# Patient Record
Sex: Male | Born: 1970 | Race: Black or African American | Hispanic: No | Smoking: Former smoker
Health system: Southern US, Community
[De-identification: ages and names within clinical notes are randomized; demographics above are authoritative.]

## PROBLEM LIST (undated history)

## (undated) DIAGNOSIS — F411 Generalized anxiety disorder: Secondary | ICD-10-CM

## (undated) DIAGNOSIS — I10 Essential (primary) hypertension: Secondary | ICD-10-CM

## (undated) HISTORY — PX: FINGER SURGERY: SHX640

## (undated) HISTORY — DX: Generalized anxiety disorder: F41.1

## (undated) HISTORY — PX: HIP SURGERY: SHX245

---

## 2012-08-24 ENCOUNTER — Emergency Department (HOSPITAL_COMMUNITY)
Admission: EM | Admit: 2012-08-24 | Discharge: 2012-08-24 | Disposition: A | Discharge status: Home / Self Care | Payer: Managed Care, Other (non HMO) | Attending: Emergency Medicine | Admitting: Emergency Medicine

## 2012-08-24 ENCOUNTER — Encounter (HOSPITAL_COMMUNITY): Payer: Self-pay | Admitting: Emergency Medicine

## 2012-08-24 DIAGNOSIS — R109 Unspecified abdominal pain: Secondary | ICD-10-CM | POA: Insufficient documentation

## 2012-08-24 DIAGNOSIS — Y929 Unspecified place or not applicable: Secondary | ICD-10-CM | POA: Insufficient documentation

## 2012-08-24 DIAGNOSIS — S39012A Strain of muscle, fascia and tendon of lower back, initial encounter: Secondary | ICD-10-CM

## 2012-08-24 DIAGNOSIS — X500XXA Overexertion from strenuous movement or load, initial encounter: Secondary | ICD-10-CM | POA: Insufficient documentation

## 2012-08-24 DIAGNOSIS — Y939 Activity, unspecified: Secondary | ICD-10-CM | POA: Insufficient documentation

## 2012-08-24 DIAGNOSIS — F17200 Nicotine dependence, unspecified, uncomplicated: Secondary | ICD-10-CM | POA: Insufficient documentation

## 2012-08-24 DIAGNOSIS — S335XXA Sprain of ligaments of lumbar spine, initial encounter: Secondary | ICD-10-CM | POA: Insufficient documentation

## 2012-08-24 DIAGNOSIS — I10 Essential (primary) hypertension: Secondary | ICD-10-CM | POA: Insufficient documentation

## 2012-08-24 DIAGNOSIS — M549 Dorsalgia, unspecified: Secondary | ICD-10-CM | POA: Insufficient documentation

## 2012-08-24 HISTORY — DX: Essential (primary) hypertension: I10

## 2012-08-24 LAB — CBC WITH DIFFERENTIAL/PLATELET
Eosinophils Absolute: 0 10*3/uL (ref 0.0–0.7)
Eosinophils Relative: 0 % (ref 0–5)
HCT: 42.5 % (ref 39.0–52.0)
Hemoglobin: 15.4 g/dL (ref 13.0–17.0)
Lymphs Abs: 1.8 10*3/uL (ref 0.7–4.0)
MCH: 29.9 pg (ref 26.0–34.0)
MCV: 82.5 fL (ref 78.0–100.0)
Monocytes Absolute: 0.9 10*3/uL (ref 0.1–1.0)
Monocytes Relative: 6 % (ref 3–12)
Platelets: 201 10*3/uL (ref 150–400)
RBC: 5.15 MIL/uL (ref 4.22–5.81)

## 2012-08-24 LAB — COMPREHENSIVE METABOLIC PANEL
BUN: 14 mg/dL (ref 6–23)
Calcium: 10.2 mg/dL (ref 8.4–10.5)
GFR calc Af Amer: >90 mL/min (ref >90)
GFR calc non Af Amer: >90 mL/min (ref >90)
Glucose, Bld: 73 mg/dL (ref 70–99)
Total Protein: 8.6 g/dL — ABNORMAL HIGH (ref 6.0–8.3)

## 2012-08-24 LAB — URINE MICROSCOPIC-ADD ON

## 2012-08-24 LAB — URINALYSIS, ROUTINE W REFLEX MICROSCOPIC
Ketones, ur: 15 mg/dL — AB
Leukocytes, UA: NEGATIVE
Nitrite: NEGATIVE
Specific Gravity, Urine: 1.022 (ref 1.005–1.030)
Urobilinogen, UA: 1 mg/dL (ref 0.0–1.0)
pH: 5.5 (ref 5.0–8.0)

## 2012-08-24 MED ORDER — ORPHENADRINE CITRATE ER 100 MG PO TB12: 100.0000 mg | ORAL_TABLET | Freq: Two times a day (BID) | ORAL | Status: DC

## 2012-08-24 MED ORDER — OXYCODONE-ACETAMINOPHEN 5-325 MG PO TABS
1.0000 | ORAL_TABLET | Freq: Once | ORAL | Status: AC
  Administered 2012-08-24: 1 via ORAL
  Filled 2012-08-24: qty 1

## 2012-08-24 MED ORDER — NAPROXEN 500 MG PO TABS: 500.0000 mg | ORAL_TABLET | Freq: Two times a day (BID) | ORAL | Status: DC

## 2012-08-24 MED ORDER — OXYCODONE-ACETAMINOPHEN 5-325 MG PO TABS: 1.0000 | ORAL_TABLET | ORAL | Status: DC | PRN

## 2012-08-24 MED ORDER — IBUPROFEN 800 MG PO TABS
800.0000 mg | ORAL_TABLET | Freq: Once | ORAL | Status: AC
  Administered 2012-08-24: 800 mg via ORAL
  Filled 2012-08-24: qty 1

## 2012-08-24 MED ORDER — CYCLOBENZAPRINE HCL 10 MG PO TABS
10.0000 mg | ORAL_TABLET | Freq: Once | ORAL | Status: AC
  Administered 2012-08-24: 10 mg via ORAL
  Filled 2012-08-24: qty 1

## 2012-08-24 NOTE — ED Notes (Signed)
Patient reports bilateral flank pain for the past several weeks; patient states that he strained his back tonight, which has caused an increase in pain.  Denies history of kidney stones; denies changes in urine.

## 2012-08-24 NOTE — ED Provider Notes (Signed)
History     CSN: [id number]  Arrival date & time 08/24/12  2014   First MD Initiated Contact with Patient 08/24/12 2213      Chief Complaint  Patient presents with  . Back Pain  . Flank Pain    (Consider location/radiation/quality/duration/timing/severity/associated sxs/prior treatment) Patient is a 42 y.o. male presenting with back pain and flank pain. The history is provided by the patient.  Back Pain   Flank Pain  He has been having pain in both flanks intermittently for last month. Today, he twisted and noted sudden, sharp pain in the left flank. Pain would be absent to be held still worse if he bends over or twisted. When present, pain was severe and he rated it at 9/10. There is no radiation of pain. There is no nausea vomiting. He denies any dysuria. He has not taken anything for pain.  Past Medical History  Diagnosis Date  . Hypertension     Past Surgical History  Procedure Date  . Hip surgery     History reviewed. No pertinent family history.  History  Substance Use Topics  . Smoking status: Current Every Day Smoker -- 1.0 packs/day  . Smokeless tobacco: Not on file  . Alcohol Use: Yes      Review of Systems  Genitourinary: Positive for flank pain.  Musculoskeletal: Positive for back pain.  All other systems reviewed and are negative.    Allergies  Review of patient's allergies indicates no known allergies.  Home Medications   Current Outpatient Rx  Name  Route  Sig  Dispense  Refill  . IBUPROFEN 200 MG PO TABS   Oral   Take 200 mg by mouth every 6 (six) hours as needed. For pain         . NAPROXEN 500 MG PO TABS   Oral   Take 1 tablet (500 mg total) by mouth 2 (two) times daily.   30 tablet   0   . ORPHENADRINE CITRATE ER 100 MG PO TB12   Oral   Take 1 tablet (100 mg total) by mouth 2 (two) times daily.   30 tablet   0   . OXYCODONE-ACETAMINOPHEN 5-325 MG PO TABS   Oral   Take 1 tablet by mouth every 4 (four) hours as needed for  pain.   20 tablet   0     BP 213/95  Pulse 87  Temp 99.1 F (37.3 C) (Oral)  Resp 18  SpO2 100%  Physical Exam  Nursing note and vitals reviewed.  42 year old male, resting comfortably and in no acute distress. Vital signs are significant for hypertension with blood pressure 213/95. Oxygen saturation is 100%, which is normal. Head is normocephalic and atraumatic. PERRLA, EOMI. Oropharynx is clear. Neck is nontender and supple without adenopathy or JVD. Back has moderate tenderness and spasm in the left paralumbar area. There is a positive straight leg raise bilaterally at 30.. Lungs are clear without rales, wheezes, or rhonchi. Chest is nontender. Heart has regular rate and rhythm without murmur. Abdomen is soft, flat, nontender without masses or hepatosplenomegaly and peristalsis is normoactive. Extremities have no cyanosis or edema, full range of motion is present. Skin is warm and dry without rash. Neurologic: Mental status is normal, cranial nerves are intact, there are no motor or sensory deficits.\  ED Course  Procedures (including critical care time)  Results for orders placed during the hospital encounter of 08/24/12  CBC WITH DIFFERENTIAL  Component Value Range   WBC 15.5 (*) 4.0 - 10.5 K/uL   RBC 5.15  4.22 - 5.81 MIL/uL   Hemoglobin 15.4  13.0 - 17.0 g/dL   HCT 16.1  09.6 - 04.5 %   MCV 82.5  78.0 - 100.0 fL   MCH 29.9  26.0 - 34.0 pg   MCHC 36.2 (*) 30.0 - 36.0 g/dL   RDW 40.9  81.1 - 91.4 %   Platelets 201  150 - 400 K/uL   Neutrophils Relative 82 (*) 43 - 77 %   Neutro Abs 12.7 (*) 1.7 - 7.7 K/uL   Lymphocytes Relative 12  12 - 46 %   Lymphs Abs 1.8  0.7 - 4.0 K/uL   Monocytes Relative 6  3 - 12 %   Monocytes Absolute 0.9  0.1 - 1.0 K/uL   Eosinophils Relative 0  0 - 5 %   Eosinophils Absolute 0.0  0.0 - 0.7 K/uL   Basophils Relative 1  0 - 1 %   Basophils Absolute 0.1  0.0 - 0.1 K/uL  COMPREHENSIVE METABOLIC PANEL      Component Value Range    Sodium 138  135 - 145 mEq/L   Potassium 4.0  3.5 - 5.1 mEq/L   Chloride 98  96 - 112 mEq/L   CO2 20  19 - 32 mEq/L   Glucose, Bld 73  70 - 99 mg/dL   BUN 14  6 - 23 mg/dL   Creatinine, Ser 7.82  0.50 - 1.35 mg/dL   Calcium 95.6  8.4 - 21.3 mg/dL   Total Protein 8.6 (*) 6.0 - 8.3 g/dL   Albumin 5.0  3.5 - 5.2 g/dL   AST 086 (*) 0 - 37 U/L   ALT 68 (*) 0 - 53 U/L   Alkaline Phosphatase 78  39 - 117 U/L   Total Bilirubin 0.5  0.3 - 1.2 mg/dL   GFR calc non Af Amer >90  >90 mL/min   GFR calc Af Amer >90  >90 mL/min  URINALYSIS, ROUTINE W REFLEX MICROSCOPIC      Component Value Range   Color, Urine YELLOW  YELLOW   APPearance HAZY (*) CLEAR   Specific Gravity, Urine 1.022  1.005 - 1.030   pH 5.5  5.0 - 8.0   Glucose, UA NEGATIVE  NEGATIVE mg/dL   Hgb urine dipstick NEGATIVE  NEGATIVE   Bilirubin Urine NEGATIVE  NEGATIVE   Ketones, ur 15 (*) NEGATIVE mg/dL   Protein, ur 30 (*) NEGATIVE mg/dL   Urobilinogen, UA 1.0  0.0 - 1.0 mg/dL   Nitrite NEGATIVE  NEGATIVE   Leukocytes, UA NEGATIVE  NEGATIVE  URINE MICROSCOPIC-ADD ON      Component Value Range   Squamous Epithelial / LPF FEW (*) RARE   WBC, UA 0-2  <3 WBC/hpf    1. Lumbar strain       MDM  Acute lumbar strain. Severe hypertension is noted. Blood pressure will be repeated. You'll need to followup with his PCP for further evaluation of his blood pressure. Blood pressure will be repeated prior to discharge.        Dione Booze, MD 08/24/12 2226

## 2012-10-18 ENCOUNTER — Telehealth (INDEPENDENT_AMBULATORY_CARE_PROVIDER_SITE_OTHER): Payer: Self-pay

## 2012-10-18 NOTE — Telephone Encounter (Signed)
[facility] notifying pt that Dr Michaell Cowing is canceling the office for 3/24 and I r/s the appt 4/9 arrive at 9:45 for an 10:15.

## 2012-10-24 ENCOUNTER — Ambulatory Visit (INDEPENDENT_AMBULATORY_CARE_PROVIDER_SITE_OTHER): Payer: Self-pay | Admitting: Surgery

## 2012-11-09 ENCOUNTER — Ambulatory Visit (INDEPENDENT_AMBULATORY_CARE_PROVIDER_SITE_OTHER): Payer: Managed Care, Other (non HMO) | Admitting: Surgery

## 2012-11-09 ENCOUNTER — Encounter (INDEPENDENT_AMBULATORY_CARE_PROVIDER_SITE_OTHER): Payer: Self-pay | Admitting: Surgery

## 2012-11-09 VITALS — BP 136/84 | HR 96 | Temp 97.8°F | Resp 16 | Ht 76.75 in | Wt 151.0 lb

## 2012-11-09 DIAGNOSIS — K648 Other hemorrhoids: Secondary | ICD-10-CM | POA: Insufficient documentation

## 2012-11-09 DIAGNOSIS — F411 Generalized anxiety disorder: Secondary | ICD-10-CM

## 2012-11-09 HISTORY — DX: Generalized anxiety disorder: F41.1

## 2012-11-09 NOTE — Patient Instructions (Addendum)
ANORECTAL SURGERY: POST OP INSTRUCTIONS  1. Take your usually prescribed home medications unless otherwise directed. 2. DIET: Follow a light bland diet the first 24 hours after arrival home, such as soup, liquids, crackers, etc.  Be sure to include lots of fluids daily.  Avoid fast food or heavy meals as your are more likely to get nauseated.  Eat a low fat the next few days after surgery.   3. PAIN CONTROL: a. Pain is best controlled by a usual combination of three different methods TOGETHER: i. Ice/Heat ii. Over the counter pain medication iii. Prescription pain medication b. Most patients will experience some swelling and discomfort in the anus/rectal area. and incisions.  Ice packs or heat (30-60 minutes up to 6 times a day) will help. Use ice for the first few days to help decrease swelling and bruising, then switch to heat such as warm towels, sitz baths, warm baths, etc to help relax tight/sore spots and speed recovery.  Some people prefer to use ice alone, heat alone, alternating between ice & heat.  Experiment to what works for you.  Swelling and bruising can take several weeks to resolve.   c. It is helpful to take an over-the-counter pain medication regularly for the first few weeks.  Choose one of the following that works best for you: i. Naproxen (Aleve, etc)  Two 220mg tabs twice a day ii. Ibuprofen (Advil, etc) Three 200mg tabs four times a day (every meal & bedtime) iii. Acetaminophen (Tylenol, etc) 500-650mg four times a day (every meal & bedtime) d. A  prescription for pain medication (such as oxycodone, hydrocodone, etc) should be given to you upon discharge.  Take your pain medication as prescribed.  i. If you are having problems/concerns with the prescription medicine (does not control pain, nausea, vomiting, rash, itching, etc), please call us [phone] to see if we need to switch you to a different pain medicine that will work better for you and/or control your side effect  better. ii. If you need a refill on your pain medication, please contact your pharmacy.  They will contact our office to request authorization. Prescriptions will not be filled after 5 pm or on week-ends. 4. KEEP YOUR BOWELS REGULAR a. The goal is one bowel movement a day b. Avoid getting constipated.  Between the surgery and the pain medications, it is common to experience some constipation.  Increasing fluid intake and taking a fiber supplement (such as Metamucil, Citrucel, FiberCon, MiraLax, etc) 1-2 times a day regularly will usually help prevent this problem from occurring.  A mild laxative (prune juice, Milk of Magnesia, MiraLax, etc) should be taken according to package directions if there are no bowel movements after 48 hours. c. Watch out for diarrhea.  If you have many loose bowel movements, simplify your diet to bland foods & liquids for a few days.  Stop any stool softeners and decrease your fiber supplement.  Switching to mild anti-diarrheal medications (Kayopectate, Pepto Bismol) can help.  If this worsens or does not improve, please call us.  5. Wound Care a. Remove your bandages the day after surgery.  Unless discharge instructions indicate otherwise, leave your bandage dry and in place overnight.  Remove the bandage during your first bowel movement.   b. Allow the wound packing to fall out over the next few days.  You can trim exposed gauze / ribbon as it falls out.  You do not need to repack the wound unless instructed otherwise.  Wear an   absorbent pad or soft cotton gauze in your underwear as needed to catch any drainage and help keep the area  c. Keep the area clean and dry.  Bathe / shower every day.  Keep the area clean by showering / bathing over the incision / wound.   It is okay to soak an open wound to help wash it.  Wet wipes or showers / gentle washing after bowel movements is often less traumatic than regular toilet paper. d. Dennis Bast may have some styrofoam-like soft packing in  the rectum which will come out with the first bowel movement.  e. You will often notice bleeding with bowel movements.  This should slow down by the end of the first week of surgery f. Expect some drainage.  This should slow down, too, by the end of the first week of surgery.  Wear an absorbent pad or soft cotton gauze in your underwear until the drainage stops. 6. ACTIVITIES as tolerated:   a. You may resume regular (light) daily activities beginning the next day-such as daily self-care, walking, climbing stairs-gradually increasing activities as tolerated.  If you can walk 30 minutes without difficulty, it is safe to try more intense activity such as jogging, treadmill, bicycling, low-impact aerobics, swimming, etc. b. Save the most intensive and strenuous activity for last such as sit-ups, heavy lifting, contact sports, etc  Refrain from any heavy lifting or straining until you are off narcotics for pain control.   c. DO NOT PUSH THROUGH PAIN.  Let pain be your guide: If it hurts to do something, don't do it.  Pain is your body warning you to avoid that activity for another week until the pain goes down. d. You may drive when you are no longer taking prescription pain medication, you can comfortably sit for long periods of time, and you can safely maneuver your car and apply brakes. e. Dennis Bast may have sexual intercourse when it is comfortable.  7. FOLLOW UP in our office a. Please call [facility] at [phone] to set up an appointment to see your surgeon in the office for a follow-up appointment approximately 2 weeks after your surgery. b. Make sure that you call for this appointment the day you arrive home to insure a convenient appointment time. 10. IF YOU HAVE DISABILITY OR FAMILY LEAVE FORMS, BRING THEM TO THE OFFICE FOR PROCESSING.  DO NOT GIVE THEM TO YOUR DOCTOR.        WHEN TO CALL us [phone]: 1. Poor pain control 2. Reactions / problems with new medications (rash/itching, nausea,  etc)  3. Fever over 101.5 F (38.5 C) 4. Inability to urinate 5. Nausea and/or vomiting 6. Worsening swelling or bruising 7. Continued bleeding from incision. 8. Increased pain, redness, or drainage from the incision  The clinic staff is available to answer your questions during regular business hours (8:30am-5pm).  Please don't hesitate to call and ask to speak to one of our nurses for clinical concerns.   A surgeon from [facility] is always on call at the hospitals   If you have a medical emergency, go to the nearest emergency room or call 911.    [facility], [place], [place], [place], [place]  [address] ? MAIN: [phone] ? TOLL FREE: [phone] ? FAX [phone] [email or web address]  HEMORRHOIDS  The rectum is the last foot of your colon, and it naturally stretches to hold stool.  Hemorrhoidal piles are natural clusters of blood vessels that  help the rectum and anal canal stretch to hold stool and allow bowel movements to eliminate feces.   Hemorrhoids are abnormally swollen blood vessels in the rectum.  Too much pressure in the rectum causes hemorrhoids by forcing blood to stretch and bulge the walls of the veins, sometimes even rupturing them.  Hemorrhoids can become like varicose veins you might see on a person's legs.  Most people will develop a flare of hemorrhoids in their lifetime.  When bulging hemorrhoidal veins are irritated, they can swell, burn, itch, cause pain, and bleed.  Most flares will calm down gradually own within a few weeks.  However, once hemorrhoids are created, they are difficult to get rid of completely and tend to flare more easily than the first flare.   Fortunately, good habits and simple medical treatment usually control hemorrhoids well, and surgery is needed only in severe cases. Types of Hemorrhoids:  Internal hemorrhoids usually don't initially hurt or itch; they are deep inside the rectum  and usually have no sensation. If they begin to push out (prolapse), pain and burning can occur.  However, internal hemorrhoids can bleed.  Anal bleeding should not be ignored since bleeding could come from a dangerous source like colorectal cancer, so persistent rectal bleeding should be investigated by a doctor, sometimes with a colonoscopy.  External hemorrhoids cause most of the symptoms - pain, burning, and itching. Nonirritated hemorrhoids can look like small skin tags coming out of the anus.   Thrombosed hemorrhoids can form when a hemorrhoid blood vessel bursts and causes the hemorrhoid to suddenly swell.  A purple blood clot can form in it and become an excruciatingly painful lump at the anus. Because of these unpleasant symptoms, immediate incision and drainage by a surgeon at an office visit can provide much relief of the pain.    PREVENTION Avoiding the most frequent causes listed below will prevent most cases of hemorrhoids: Constipation Hard stools Diarrhea  Constant sitting  Straining with bowel movements Sitting on the toilet for a long time  Severe coughing  episodes Pregnancy / Childbirth  Heavy Lifting  Sometimes avoiding the above triggers is difficult:  How can you avoid sitting all day if you have a seated job? Also, we try to avoid coughing and diarrhea, but sometimes it's beyond your control.  Still, there are some practical hints to help: Keep the anal and genital area clean.  Moistened tissues such as flushable wet wipes are less irritating than toilet paper.  Using irrigating showers or bottle irrigation washing gently cleans this sensitive area.   Avoid dry toilet paper when cleaning after bowel movements.  Marland Kitchen Keep the anal and genital area dry.  Lightly pat the rectal area dry.  Avoid rubbing.  Talcum or baby powders can help GET YOUR STOOLS SOFT.   This is the most important way to prevent irritated hemorrhoids.  Hard stools are like sandpaper to the anorectal canal and  will cause more problems.  The goal: ONE SOFT BOWEL MOVEMENT A DAY!  BMs from every other day to 3 times a day is a tolerable range Treat coughing, diarrhea and constipation early since irritated hemorrhoids may soon follow.  If your main job activity is seated, always stand or walk during your breaks. Make it a point to stand and walk at least 5 minutes every hour and try to shift frequently in your chair to avoid direct rectal pressure.  Always exhale as you strain or lift. Don't hold your breath.  Do  not delay or try to prevent a bowel movement when the urge is present. Exercise regularly (walking or jogging 60 minutes a day) to stimulate the bowels to move. No reading or other activity while on the toilet. If bowel movements take longer than 5 minutes, you are too constipated. AVOID CONSTIPATION Drink plenty of liquids (1 1/2 to 2 quarts of water and other fluids a day unless fluid restricted for another medical condition). Liquids that contain caffeine (coffee a, tea, soft drinks) can be dehydrating and should be avoided until constipation is controlled. Consider minimizing milk, as dairy products may be constipating. Eat plenty of fiber (30g a day ideal, more if needed).  Fiber is the undigested part of plant food that passes into the colon, acting as "natures broom" to encourage bowel motility and movement.  Fiber can absorb and hold large amounts of water. This results in a larger, bulkier stool, which is soft and easier to pass.  Eating foods high in fiber - 12 servings - such as  Vegetables: Root (potatoes, carrots, turnips), Leafy green (lettuce, salad greens, celery, spinach), High residue (cabbage, broccoli, etc.) Fruit: Fresh, Dried (prunes, apricots, cherries), Stewed (applesauce)  Whole grain breads, pasta, whole wheat Bran cereals, muffins, etc. Consider adding supplemental bulking fiber which retains large volumes of water: Psyllium ground seeds --available as Metamucil, Konsyl,  Effersyllium, Per Diem Fiber, or the less expensive generic forms.  Citrucel  (methylcellulose wood fiber) . FiberCon (Polycarbophil) Polyethylene Glycol - and "artificial" fiber commonly called Miralax or Glycolax.  It is helpful for people with gassy or bloated feelings with regular fiber Flax Seed - a less gassy natural fiber  Laxatives can be useful for a short period if constipation is severe Osmotics (Milk of Magnesia, Fleets Phospho-Soda, Magnesium Citrate)  Stimulants (Senokot,   Castor Oil,  Dulcolax, Ex-Lax)    Laxatives are not a good long-term solution as it can stress the bowels and cause too much mineral loss and dehydration.   Avoid taking laxatives for more than 7 days in a row.  AVOID DIARRHEA Switch to liquids and simpler foods for a few days to avoid stressing your intestines further. Avoid dairy products (especially milk & ice cream) for a short time.  The intestines often can lose the ability to digest lactose when stressed. Avoid foods that cause gassiness or bloating.  Typical foods include beans and other legumes, cabbage, broccoli, and dairy foods.  Every person has some sensitivity to other foods, so listen to your body and avoid those foods that trigger problems for you. Adding fiber (Citrucel, Metamucil, FiberCon, Flax seed, Miralax) gradually can help thicken stools by absorbing excess fluid and retrain the intestines to act more normally.  Slowly increase the dose over a few weeks.  Too much fiber too soon can backfire and cause cramping & bloating. Probiotics (such as active yogurt, Align, etc) may help repopulate the intestines and colon with normal bacteria and calm down a sensitive digestive tract.  Most studies show it to be of mild help, though, and such products can be costly. Medicines: Bismuth subsalicylate (ex. Kayopectate, Pepto Bismol) every 30 minutes for up to 6 doses can help control diarrhea.  Avoid if pregnant. Loperamide (Immodium) can slow down  diarrhea.  Start with two tablets (4mg total) first and then try one tablet every 6 hours.  Avoid if you are having fevers or severe pain.  If you are not better or start feeling worse, stop all medicines and call your doctor for   advice Call your doctor if you are getting worse or not better.  Sometimes further testing (cultures, endoscopy, X-ray studies, bloodwork, etc) may be needed to help diagnose and treat the cause of the diarrhea. TREATMENT OF HEMORRHOID FLARE If these preventive measures fail, you must take action right away! Hemorrhoids are one condition that can be mild in the morning and become intolerable by nightfall. Most hemorrhoidal flares take several weeks to calm down.  These suggestions can help: Warm soaks.  This helps more than any topical medication.  Use up to 8 times a day.  Usually sitz baths or sitting in a warm bathtub helps.  Sitting on moist warm towels are helpful.  Switching to ice packs/cool compresses can be helpful Normalize your bowels.  Extremes of diarrhea or constipation will make hemorrhoids worse.  One soft bowel movement a day is the goal.  Fiber can help get your bowels regular Wet wipes instead of toilet paper Pain control with a NSAID such as ibuprofen (Advil) or naproxen (Aleve) or acetaminophen (Tylenol) around the clock.  Narcotics are constipating and should be minimized if possible Topical creams contain steroids (bydrocortisone) or local anesthetic (xylocaine) can help make pain and itching more tolerable.   EVALUATION If hemorrhoids are still causing problems, you could benefit by an evaluation by a surgeon.  The surgeon will obtain a history and examine you.  If hemorrhoids are diagnosed, some therapies can be offered in the office, usually with an anoscope into the less sensitive area of the rectum: -injection of hemorrhoids (sclerotherapy) can scar the blood vessels of the swollen/enlarged hemorrhoids to help shrink them down to a more normal  size -rubber banding of the enlarged hemorrhoids to help shrink them down to a more normal size -drainage of the blood clot causing a thrombosed hemorrhoid,  to relieve the severe pain   While 90% of the time such problems from hemorrhoids can be managed without preceding to surgery, sometimes the hemorrhoids require a operation to control the problem (uncontrolled bleeding, prolapse, pain, etc.).   This involves being placed under general anesthesia where the surgeon can confirm the diagnosis and remove, suture, or staple the hemorrhoid(s).  Your surgeon can help you treat the problem appropriately.    Smoking Cessation, Tips for Success YOU CAN QUIT SMOKING If you are ready to quit smoking, congratulations! You have chosen to help yourself be healthier. Cigarettes bring nicotine, tar, carbon monoxide, and other irritants into your body. Your lungs, heart, and blood vessels will be able to work better without these poisons. There are many different ways to quit smoking. Nicotine gum, nicotine patches, a nicotine inhaler, or nicotine nasal spray can help with physical craving. Hypnosis, support groups, and medicines help break the habit of smoking. Here are some tips to help you quit for good.  Throw away all cigarettes.  Clean and remove all ashtrays from your home, work, and car.  On a card, write down your reasons for quitting. Carry the card with you and read it when you get the urge to smoke.  Cleanse your body of nicotine. Drink enough water and fluids to keep your urine clear or pale yellow. Do this after quitting to flush the nicotine from your body.  Learn to predict your moods. Do not let a bad situation be your excuse to have a cigarette. Some situations in your life might tempt you into wanting a cigarette.  Never have "just one" cigarette. It leads to wanting another and another. Remind yourself  of your decision to quit.  Change habits associated with smoking. If you smoked while  driving or when feeling stressed, try other activities to replace smoking. Stand up when drinking your coffee. Brush your teeth after eating. Sit in a different chair when you read the paper. Avoid alcohol while trying to quit, and try to drink fewer caffeinated beverages. Alcohol and caffeine may urge you to smoke.  Avoid foods and drinks that can trigger a desire to smoke, such as sugary or spicy foods and alcohol.  Ask people who smoke not to smoke around you.  Have something planned to do right after eating or having a cup of coffee. Take a walk or exercise to perk you up. This will help to keep you from overeating.  Try a relaxation exercise to calm you down and decrease your stress. Remember, you may be tense and nervous for the first 2 weeks after you quit, but this will pass.  Find new activities to keep your hands busy. Play with a pen, coin, or rubber band. Doodle or draw things on paper.  Brush your teeth right after eating. This will help cut down on the craving for the taste of tobacco after meals. You can try mouthwash, too.  Use oral substitutes, such as lemon drops, carrots, a cinnamon stick, or chewing gum, in place of cigarettes. Keep them handy so they are available when you have the urge to smoke.  When you have the urge to smoke, try deep breathing.  Designate your home as a nonsmoking area.  If you are a heavy smoker, ask your caregiver about a prescription for nicotine chewing gum. It can ease your withdrawal from nicotine.  Reward yourself. Set aside the cigarette money you save and buy yourself something nice.  Look for support from others. Join a support group or smoking cessation program. Ask someone at home or at work to help you with your plan to quit smoking.  Always ask yourself, "Do I need this cigarette or is this just a reflex?" Tell yourself, "Today, I choose not to smoke," or "I do not want to smoke." You are reminding yourself of your decision to quit,  even if you do smoke a cigarette. HOW WILL I FEEL WHEN I QUIT SMOKING?  The benefits of not smoking start within days of quitting.  You may have symptoms of withdrawal because your body is used to nicotine (the addictive substance in cigarettes). You may crave cigarettes, be irritable, feel very hungry, cough often, get headaches, or have difficulty concentrating.  The withdrawal symptoms are only temporary. They are strongest when you first quit but will go away within 10 to 14 days.  When withdrawal symptoms occur, stay in control. Think about your reasons for quitting. Remind yourself that these are signs that your body is healing and getting used to being without cigarettes.  Remember that withdrawal symptoms are easier to treat than the major diseases that smoking can cause.  Even after the withdrawal is over, expect periodic urges to smoke. However, these cravings are generally short-lived and will go away whether you smoke or not. Do not smoke!  If you relapse and smoke again, do not lose hope. Most smokers quit 3 times before they are successful.  If you relapse, do not give up! Plan ahead and think about what you will do the next time you get the urge to smoke. LIFE AS A NONSMOKER: MAKE IT FOR A MONTH, MAKE IT FOR LIFE Day 1: Hang  this page where you will see it every day. Day 2: Get rid of all ashtrays, matches, and lighters. Day 3: Drink water. Breathe deeply between sips. Day 4: Avoid places with smoke-filled air, such as bars, clubs, or the smoking section of restaurants. Day 5: Keep track of how much money you save by not smoking. Day 6: Avoid boredom. Keep a good book with you or go to the movies. Day 7: Reward yourself! One week without smoking! Day 8: Make a dental appointment to get your teeth cleaned. Day 9: Decide how you will turn down a cigarette before it is offered to you. Day 10: Review your reasons for quitting. Day 11: Distract yourself. Stay active to keep  your mind off smoking and to relieve tension. Take a walk, exercise, read a book, do a crossword puzzle, or try a new hobby. Day 12: Exercise. Get off the bus before your stop or use stairs instead of escalators. Day 13: Call on friends for support and encouragement. Day 14: Reward yourself! Two weeks without smoking! Day 15: Practice deep breathing exercises. Day 16: Bet a friend that you can stay a nonsmoker. Day 17: Ask to sit in nonsmoking sections of restaurants. Day 18: Hang up "No Smoking" signs. Day 19: Think of yourself as a nonsmoker. Day 20: Each morning, tell yourself you will not smoke. Day 21: Reward yourself! Three weeks without smoking! Day 22: Think of smoking in negative ways. Remember how it stains your teeth, gives you bad breath, and leaves you short of breath. Day 23: Eat a nutritious breakfast. Day 24:Do not relive your days as a smoker. Day 25: Hold a pencil in your hand when talking on the telephone. Day 26: Tell all your friends you do not smoke. Day 27: Think about how much better food tastes. Day 28: Remember, one cigarette is one too many. Day 29: Take up a hobby that will keep your hands busy. Day 30: Congratulations! One month without smoking! Give yourself a big reward. Your caregiver can direct you to community resources or hospitals for support, which may include:  Group support.  Education.  Hypnosis.  Subliminal therapy. Document Released: 04/17/2004 Document Revised: 10/12/2011 Document Reviewed: 05/06/2009 [facility] Patient Information 2013 [place], [place].

## 2012-11-09 NOTE — Progress Notes (Signed)
Subjective:     Patient ID: Christopher Henry, male   DOB: 1971/07/13, 42 y.o.   MRN: [id number]  HPI  Christopher Henry  [birth year] [id number]  Patient Care Team: Sissy Hoff, MD as PCP - General (Family Medicine)  This patient is a 42 y.o.male who presents today for surgical evaluation at the request of Dr. Azucena Cecil.   Reason for visit: Hemorrhoids with prolapse  Young smoking male.  Struggled with him or problems since he was a teenager.  Notes that now they come out every time he moves his bowel.  Has to push it back in.  He usually has one loose bowel movement a day.  Some occasional bleeding.  No severe pain.  No history of prior treatments.  No pus or drainage.  No personal nor family history of GI/colon cancer, inflammatory bowel disease, irritable bowel syndrome, allergy such as Celiac Sprue, dietary/dairy problems, colitis, ulcers nor gastritis.  No recent sick contacts/gastroenteritis.  No travel outside the country.  No changes in diet.  Patient walks 2 miles without difficulty.  No exertional chest/neck/shoulder/arm pain.   Patient Active Problem List  Diagnosis  . Anxiety state, unspecified  . Internal hemorrhoids with prolapse    Past Medical History  Diagnosis Date  . Hypertension     Past Surgical History  Procedure Laterality Date  . Hip surgery      History   Social History  . Marital Status: Single    Spouse Name: N/A    Number of Children: N/A  . Years of Education: N/A   Occupational History  . Not on file.   Social History Main Topics  . Smoking status: Current Every Day Smoker -- 1.00 packs/day  . Smokeless tobacco: Never Used  . Alcohol Use: Yes  . Drug Use: No  . Sexually Active: Not on file   Other Topics Concern  . Not on file   Social History Narrative  . No narrative on file    Family History  Problem Relation Age of Onset  . Hypertension Father   . Hypertension Brother   . Cancer Maternal Grandmother     unknown to pt    Current  Outpatient Prescriptions  Medication Sig Dispense Refill  . losartan (COZAAR) 100 MG tablet       . ibuprofen (ADVIL,MOTRIN) 200 MG tablet Take 200 mg by mouth every 6 (six) hours as needed. For pain      . oxyCODONE-acetaminophen (PERCOCET/ROXICET) 5-325 MG per tablet Take 1 tablet by mouth every 4 (four) hours as needed for pain.  20 tablet  0   No current facility-administered medications for this visit.     No Known Allergies  BP 136/84  Pulse 96  Temp(Src) 97.8 F (36.6 C) (Temporal)  Resp 16  Ht 6' 4.75" (1.949 m)  Wt 151 lb (68.493 kg)  BMI 18.03 kg/m2  No results found.   Review of Systems  Constitutional: Negative for fever, chills and diaphoresis.  HENT: Negative for nosebleeds, sore throat, facial swelling, mouth sores, trouble swallowing and ear discharge.   Eyes: Negative for photophobia, discharge and visual disturbance.  Respiratory: Negative for choking, chest tightness, shortness of breath and stridor.   Cardiovascular: Negative for chest pain and palpitations.  Gastrointestinal: Positive for anal bleeding. Negative for nausea, vomiting, abdominal pain, diarrhea, constipation, blood in stool, abdominal distention and rectal pain.  Genitourinary: Negative for dysuria, urgency, difficulty urinating and testicular pain.  Musculoskeletal: Negative for myalgias, back pain, arthralgias and gait problem.  Skin: Negative for color change, pallor, rash and wound.  Neurological: Negative for dizziness, speech difficulty, weakness, numbness and headaches.  Hematological: Negative for adenopathy. Does not bruise/bleed easily.  Psychiatric/Behavioral: Negative for hallucinations, confusion and agitation. The patient is nervous/anxious.        Objective:   Physical Exam  Constitutional: He is oriented to person, place, and time. He appears well-developed and well-nourished. No distress.  HENT:  Head: Normocephalic.  Mouth/Throat: Oropharynx is clear and moist. No  oropharyngeal exudate.  Eyes: Conjunctivae and EOM are normal. Pupils are equal, round, and reactive to light. No scleral icterus.  Neck: Normal range of motion. Neck supple. No tracheal deviation present.  Cardiovascular: Normal rate, regular rhythm and intact distal pulses.   Pulmonary/Chest: Effort normal and breath sounds normal. No respiratory distress.  Abdominal: Soft. He exhibits no distension. There is no tenderness. Hernia confirmed negative in the right inguinal area and confirmed negative in the left inguinal area.  Genitourinary:  Exam done with assistance of male Medical Assistant in the room.  Very anxious/clenched but consolable  Perianal skin clean with good hygiene.  No pruritis.  No external skin tags / hemorrhoids of significance.  No pilonidal disease.  No fissure.  No abscess/fistula.    Tolerates digital and anoscopic rectal exam.  Normal sphincter tone.  No rectal masses.  Hemorrhoidal piles enlarged x3  - prolapses w Valsalva   Musculoskeletal: Normal range of motion. He exhibits no tenderness.  Lymphadenopathy:    He has no cervical adenopathy.       Right: No inguinal adenopathy present.       Left: No inguinal adenopathy present.  Neurological: He is alert and oriented to person, place, and time. No cranial nerve deficit. He exhibits normal muscle tone. Coordination normal.  Skin: Skin is warm and dry. No rash noted. He is not diaphoretic. No erythema. No pallor.  Psychiatric: He has a normal mood and affect. His behavior is normal. Judgment and thought content normal.       Assessment:     Prolapsing hemorrhoids x3 pilesin anxious smoking male.     Plan:     After long discussion, I recommended hemorrhoidal banding as the first step.  I do not think he is mentally prepared to consider hemorrhoidal surgery.  He would be a good candidate for a THD hemorrhoidal ligation and pexy if banding x2 visits does not work.Hopefully that is enough to calm things down.   He wished to proceed:  The anatomy & physiology of the anorectal region was discussed.  The pathophysiology of hemorrhoids and differential diagnosis was discussed.  Natural history progression  was discussed.   I stressed the importance of a bowel regimen to have daily soft bowel movements to minimize progression of disease.     The patient's symptoms are not adequately controlled.  Therefore, I recommended banding to treat the hemorrhoids.  I went over the technique, risks, benefits, and alternatives.   Goals of post-operative recovery were discussed as well.  Questions were answered.  The patient expressed understanding & wished to proceed.  The patient was positioned in the lateral decubitus position.  Perianal & rectal examination was done.  Using anoscopy, I ligated the hemorrhoids above the dentate line with banding x 3 piles.  The patient tolerated the procedure well.  Educational handouts further explaining the pathology, treatment options, and bowel regimen were given as well.    We talked to the patient about the dangers of smoking.  We  stressed that tobacco use dramatically increases the risk of peri-operative complications such as infection, tissue necrosis leaving to problems with incision/wound and organ healing, heart attack, stroke, DVT, pulmonary embolism, and death.  We noted there are programs in our community to help stop smoking.

## 2012-11-15 ENCOUNTER — Telehealth (INDEPENDENT_AMBULATORY_CARE_PROVIDER_SITE_OTHER): Payer: Self-pay

## 2012-11-15 NOTE — Telephone Encounter (Signed)
Pt had rubber band treatment performed for his hemorrhoids.  Calling today concerned because he saw them in the toilet.  I reassured him that they do fall off after the hemorrhoid has been strangulated.  Everything should be fine unless he starts having more pain and/or bleeding.  Pt understood and will call if necessary.

## 2014-02-22 ENCOUNTER — Encounter (HOSPITAL_COMMUNITY): Payer: Self-pay | Admitting: Certified Registered"

## 2014-02-22 ENCOUNTER — Ambulatory Visit: Admit: 2014-02-22 | Payer: Self-pay | Admitting: Orthopedic Surgery

## 2014-02-22 SURGERY — OPEN REDUCTION INTERNAL FIXATION (ORIF) FINGER WITH RADIAL BONE GRAFT
Anesthesia: General | Laterality: Right

## 2014-05-16 ENCOUNTER — Encounter: Payer: Self-pay | Admitting: *Deleted

## 2019-10-24 ENCOUNTER — Encounter: Payer: Self-pay | Admitting: Emergency Medicine

## 2019-10-24 ENCOUNTER — Other Ambulatory Visit: Payer: Self-pay

## 2019-10-24 ENCOUNTER — Emergency Department
Admission: EM | Admit: 2019-10-24 | Discharge: 2019-10-24 | Disposition: A | Discharge status: Home / Self Care | Payer: Managed Care, Other (non HMO) | Attending: Emergency Medicine | Admitting: Emergency Medicine

## 2019-10-24 DIAGNOSIS — I10 Essential (primary) hypertension: Secondary | ICD-10-CM | POA: Insufficient documentation

## 2019-10-24 DIAGNOSIS — R42 Dizziness and giddiness: Secondary | ICD-10-CM | POA: Insufficient documentation

## 2019-10-24 DIAGNOSIS — Z7982 Long term (current) use of aspirin: Secondary | ICD-10-CM | POA: Insufficient documentation

## 2019-10-24 DIAGNOSIS — F17210 Nicotine dependence, cigarettes, uncomplicated: Secondary | ICD-10-CM | POA: Insufficient documentation

## 2019-10-24 LAB — CBC
HCT: 30.3 % — ABNORMAL LOW (ref 39.0–52.0)
Hemoglobin: 10 g/dL — ABNORMAL LOW (ref 13.0–17.0)
MCH: 23.8 pg — ABNORMAL LOW (ref 26.0–34.0)
MCHC: 33 g/dL (ref 30.0–36.0)
MCV: 72.1 fL — ABNORMAL LOW (ref 80.0–100.0)
Platelets: 282 10*3/uL (ref 150–400)
RBC: 4.2 MIL/uL — ABNORMAL LOW (ref 4.22–5.81)
RDW: 22.5 % — ABNORMAL HIGH (ref 11.5–15.5)
WBC: 10.8 10*3/uL — ABNORMAL HIGH (ref 4.0–10.5)
nRBC: 0 % (ref 0.0–0.2)

## 2019-10-24 LAB — BASIC METABOLIC PANEL
Anion gap: 9 (ref 5–15)
BUN: 16 mg/dL (ref 6–20)
CO2: 21 mmol/L — ABNORMAL LOW (ref 22–32)
Calcium: 9.4 mg/dL (ref 8.9–10.3)
Chloride: 106 mmol/L (ref 98–111)
Creatinine, Ser: 1.13 mg/dL (ref 0.61–1.24)
GFR calc Af Amer: >60 mL/min (ref >60)
GFR calc non Af Amer: >60 mL/min (ref >60)
Glucose, Bld: 108 mg/dL — ABNORMAL HIGH (ref 70–99)
Potassium: 4.3 mmol/L (ref 3.5–5.1)
Sodium: 136 mmol/L (ref 135–145)

## 2019-10-24 LAB — TROPONIN I (HIGH SENSITIVITY)
Troponin I (High Sensitivity): 6 ng/L (ref <18)
Troponin I (High Sensitivity): 5 ng/L (ref <18)

## 2019-10-24 MED ORDER — LOSARTAN POTASSIUM 50 MG PO TABS
50.0000 mg | ORAL_TABLET | Freq: Once | ORAL | Status: AC
  Administered 2019-10-24: 50 mg via ORAL
  Filled 2019-10-24: qty 1

## 2019-10-24 MED ORDER — LOSARTAN POTASSIUM 50 MG PO TABS: 50.0000 mg | ORAL_TABLET | Freq: Every day | ORAL | 1 refills | Status: DC

## 2019-10-24 NOTE — ED Notes (Signed)
Says here for blood pressure.  Says he used tobe on meds for it, but he has not taken since about 2019 because he lost his insurance.  Says today he got dizzy at work and had sl headache.  They took him to the nurse and took bp and it was up,.

## 2019-10-24 NOTE — ED Provider Notes (Signed)
[facility] Emergency Department Provider Note   ____________________________________________   First MD Initiated Contact with Patient 10/24/19 1211     (approximate)  I have reviewed the triage vital signs and the nursing notes.   HISTORY  Chief Complaint Dizziness, Hypertension, and Headache    HPI Christopher Henry is a 49 y.o. male with past medical history of hypertension who presents to the ED complaining of dizziness and elevated blood pressure.  Patient reports that he was working at the [employer] earlier today when he started to feel dizzy and lightheaded.  He denies any associated chest pain or shortness of breath and all dizziness has resolved at this time.  He has otherwise been feeling well with no recent fevers, cough, vomiting, or diarrhea.  They checked his blood pressure at work and when it was found to be elevated, he was advised to be checked out in the ED.  He states he previously took medication for his blood pressure, but has not taken it since 2019 due to losing his health insurance.        Past Medical History:  Diagnosis Date  . Anxiety state, unspecified 11/09/2012  . Hypertension     Patient Active Problem List   Diagnosis Date Noted  . Anxiety state, unspecified 11/09/2012  . Internal hemorrhoids with prolapse 11/09/2012    Past Surgical History:  Procedure Laterality Date  . HIP SURGERY      Prior to Admission medications   Medication Sig Start Date End Date Taking? Authorizing Provider  aspirin 81 MG tablet Take 81 mg by mouth daily.    [provider]  losartan (COZAAR) 50 MG tablet Take 1 tablet (50 mg total) by mouth daily. 10/24/19 12/23/19  Blake Divine, MD    Allergies Patient has no allergy information on record.  Family History  Problem Relation Age of Onset  . Hypertension Father   . Hypertension Brother   . Cancer Maternal Grandmother        unknown to pt    Social History Social  History   Tobacco Use  . Smoking status: Current Every Day Smoker    Packs/day: 1.00  . Smokeless tobacco: Never Used  Substance Use Topics  . Alcohol use: Yes  . Drug use: No    Review of Systems  Constitutional: No fever/chills Eyes: No visual changes. ENT: No sore throat. Cardiovascular: Denies chest pain.  Positive for dizziness and lightheadedness. Respiratory: Denies shortness of breath. Gastrointestinal: No abdominal pain.  No nausea, no vomiting.  No diarrhea.  No constipation. Genitourinary: Negative for dysuria. Musculoskeletal: Negative for back pain. Skin: Negative for rash. Neurological: Negative for headaches, focal weakness or numbness.  ____________________________________________   PHYSICAL EXAM:  VITAL SIGNS: ED Triage Vitals  Enc Vitals Group     BP 10/24/19 0951 (!) 177/89     Pulse Rate 10/24/19 0951 (!) 101     Resp 10/24/19 0951 18     Temp 10/24/19 0951 99.2 F (37.3 C)     Temp Source 10/24/19 0951 Oral     SpO2 10/24/19 0951 100 %     Weight 10/24/19 0949 160 lb (72.6 kg)     Height 10/24/19 0949 6\' 4"  (1.93 m)     Head Circumference --      Peak Flow --      Pain Score 10/24/19 0948 3     Pain Loc --      Pain Edu? --  Excl. in [place]? --     Constitutional: Alert and oriented. Eyes: Conjunctivae are normal. Head: Atraumatic. Nose: No congestion/rhinnorhea. Mouth/Throat: Mucous membranes are moist. Neck: Normal ROM Cardiovascular: Normal rate, regular rhythm. Grossly normal heart sounds. Respiratory: Normal respiratory effort.  No retractions. Lungs CTAB. Gastrointestinal: Soft and nontender. No distention. Genitourinary: deferred Musculoskeletal: No lower extremity tenderness nor edema. Neurologic:  Normal speech and language. No gross focal neurologic deficits are appreciated. Skin:  Skin is warm, dry and intact. No rash noted. Psychiatric: Mood and affect are normal. Speech and behavior are normal.   ____________________________________________   LABS (all labs ordered are listed, but only abnormal results are displayed)  Labs Reviewed  BASIC METABOLIC PANEL - Abnormal; Notable for the following components:      Result Value   CO2 21 (*)    Glucose, Bld 108 (*)    All other components within normal limits  CBC - Abnormal; Notable for the following components:   WBC 10.8 (*)    RBC 4.20 (*)    Hemoglobin 10.0 (*)    HCT 30.3 (*)    MCV 72.1 (*)    MCH 23.8 (*)    RDW 22.5 (*)    All other components within normal limits  TROPONIN I (HIGH SENSITIVITY)  TROPONIN I (HIGH SENSITIVITY)   ____________________________________________  EKG  ED ECG REPORT I, Blake Divine, the attending physician, personally viewed and interpreted this ECG.   Date: 10/24/2019  EKG Time: 9:58  Rate: 98  Rhythm: normal sinus rhythm  Axis: Normal  Intervals:none  ST&T Change: None   PROCEDURES  Procedure(s) performed (including Critical Care):  Procedures   ____________________________________________   INITIAL IMPRESSION / ASSESSMENT AND PLAN / ED COURSE       49 year old male with past medical history of hypertension, not currently on medications, presents to the ED with episode of dizziness and lightheadedness while at work earlier today.  He is otherwise been feeling well with no fevers, cough, chest pain, or shortness of breath.  There is no evidence of arrhythmia or ischemia on his EKG and symptoms sound most consistent with vasovagal episode.  His blood pressure was incidentally noted to be elevated and he has been off blood pressure medications for about 2 years.  Work-up here in the ED is unremarkable, 2 sets of troponin are negative.  We will restart patient on losartan and he was given referral for PCP follow-up.  He was counseled to return to the ED for new or worsening symptoms, patient agrees with plan.      ____________________________________________   FINAL  CLINICAL IMPRESSION(S) / ED DIAGNOSES  Final diagnoses:  Dizziness  Essential hypertension     ED Discharge Orders         Ordered    losartan (COZAAR) 50 MG tablet  Daily     10/24/19 1255           Note:  This document was prepared using Dragon voice recognition software and may include unintentional dictation errors.   Blake Divine, MD 10/24/19 1258

## 2019-10-24 NOTE — ED Triage Notes (Signed)
Pt reports was at work and felt dizzy so they took his BP and told him it was elevated and that he needed to come to the ED. Pt reports has a headache as well.

## 2019-10-24 NOTE — ED Triage Notes (Signed)
Pt with hx of HTN and used to take meds but states he has not in awhile.

## 2019-10-24 NOTE — ED Notes (Signed)
NAD noted at time of D/C. Pt denies questions or concerns. Pt ambulatory to the lobby at this time.  

## 2021-02-09 ENCOUNTER — Emergency Department (HOSPITAL_COMMUNITY)
Admission: EM | Admit: 2021-02-09 | Discharge: 2021-02-10 | Disposition: A | Discharge status: Home / Self Care | Payer: 59 | Attending: Emergency Medicine | Admitting: Emergency Medicine

## 2021-02-09 ENCOUNTER — Emergency Department (HOSPITAL_COMMUNITY): Payer: 59

## 2021-02-09 ENCOUNTER — Encounter (HOSPITAL_COMMUNITY): Payer: Self-pay | Admitting: Emergency Medicine

## 2021-02-09 ENCOUNTER — Encounter: Payer: Self-pay | Admitting: Nurse Practitioner

## 2021-02-09 ENCOUNTER — Other Ambulatory Visit: Payer: Self-pay

## 2021-02-09 DIAGNOSIS — Z20822 Contact with and (suspected) exposure to covid-19: Secondary | ICD-10-CM | POA: Diagnosis not present

## 2021-02-09 DIAGNOSIS — R079 Chest pain, unspecified: Secondary | ICD-10-CM

## 2021-02-09 DIAGNOSIS — F17210 Nicotine dependence, cigarettes, uncomplicated: Secondary | ICD-10-CM | POA: Diagnosis not present

## 2021-02-09 DIAGNOSIS — R062 Wheezing: Secondary | ICD-10-CM | POA: Diagnosis not present

## 2021-02-09 DIAGNOSIS — I10 Essential (primary) hypertension: Secondary | ICD-10-CM | POA: Insufficient documentation

## 2021-02-09 DIAGNOSIS — R0689 Other abnormalities of breathing: Secondary | ICD-10-CM | POA: Diagnosis not present

## 2021-02-09 DIAGNOSIS — J3489 Other specified disorders of nose and nasal sinuses: Secondary | ICD-10-CM | POA: Diagnosis not present

## 2021-02-09 DIAGNOSIS — R059 Cough, unspecified: Secondary | ICD-10-CM | POA: Diagnosis not present

## 2021-02-09 DIAGNOSIS — R12 Heartburn: Secondary | ICD-10-CM | POA: Insufficient documentation

## 2021-02-09 DIAGNOSIS — Z79899 Other long term (current) drug therapy: Secondary | ICD-10-CM | POA: Diagnosis not present

## 2021-02-09 DIAGNOSIS — R072 Precordial pain: Secondary | ICD-10-CM | POA: Insufficient documentation

## 2021-02-09 LAB — BASIC METABOLIC PANEL
Anion gap: 11 (ref 5–15)
BUN: 21 mg/dL — ABNORMAL HIGH (ref 6–20)
CO2: 23 mmol/L (ref 22–32)
Calcium: 9.4 mg/dL (ref 8.9–10.3)
Chloride: 103 mmol/L (ref 98–111)
Creatinine, Ser: 1.11 mg/dL (ref 0.61–1.24)
GFR, Estimated: >60 mL/min (ref >60)
Glucose, Bld: 98 mg/dL (ref 70–99)
Potassium: 3.7 mmol/L (ref 3.5–5.1)
Sodium: 137 mmol/L (ref 135–145)

## 2021-02-09 LAB — CBC
HCT: 31.6 % — ABNORMAL LOW (ref 39.0–52.0)
Hemoglobin: 10.3 g/dL — ABNORMAL LOW (ref 13.0–17.0)
MCH: 25.2 pg — ABNORMAL LOW (ref 26.0–34.0)
MCHC: 32.6 g/dL (ref 30.0–36.0)
MCV: 77.3 fL — ABNORMAL LOW (ref 80.0–100.0)
Platelets: 190 10*3/uL (ref 150–400)
RBC: 4.09 MIL/uL — ABNORMAL LOW (ref 4.22–5.81)
RDW: 24.2 % — ABNORMAL HIGH (ref 11.5–15.5)
WBC: 11.4 10*3/uL — ABNORMAL HIGH (ref 4.0–10.5)
nRBC: 0 % (ref 0.0–0.2)

## 2021-02-09 LAB — TROPONIN I (HIGH SENSITIVITY): Troponin I (High Sensitivity): 7 ng/L (ref <18)

## 2021-02-09 NOTE — ED Triage Notes (Signed)
C/o generalized chest pain x 2 weeks with cough and SOB.

## 2021-02-10 LAB — TROPONIN I (HIGH SENSITIVITY): Troponin I (High Sensitivity): 9 ng/L (ref <18)

## 2021-02-10 LAB — SARS CORONAVIRUS 2 (TAT 6-24 HRS): SARS Coronavirus 2: NEGATIVE

## 2021-02-10 LAB — MAGNESIUM: Magnesium: 1.6 mg/dL — ABNORMAL LOW (ref 1.7–2.4)

## 2021-02-10 MED ORDER — DOXYCYCLINE HYCLATE 100 MG PO TABS
100.0000 mg | ORAL_TABLET | Freq: Once | ORAL | Status: AC
  Administered 2021-02-10: 100 mg via ORAL
  Filled 2021-02-10: qty 1

## 2021-02-10 MED ORDER — OXYCODONE-ACETAMINOPHEN 5-325 MG PO TABS
1.0000 | ORAL_TABLET | Freq: Once | ORAL | Status: DC
  Filled 2021-02-10: qty 1

## 2021-02-10 MED ORDER — AMLODIPINE BESYLATE 5 MG PO TABS: 5.0000 mg | ORAL_TABLET | Freq: Every day | ORAL | 1 refills | Status: DC

## 2021-02-10 MED ORDER — MAGNESIUM OXIDE 400 MG PO CAPS: 400.0000 mg | ORAL_CAPSULE | Freq: Every day | ORAL | 0 refills | Status: AC

## 2021-02-10 MED ORDER — MAGNESIUM OXIDE -MG SUPPLEMENT 400 (240 MG) MG PO TABS
400.0000 mg | ORAL_TABLET | Freq: Once | ORAL | Status: AC
  Administered 2021-02-10: 400 mg via ORAL
  Filled 2021-02-10: qty 1

## 2021-02-10 MED ORDER — AMLODIPINE BESYLATE 5 MG PO TABS
5.0000 mg | ORAL_TABLET | Freq: Once | ORAL | Status: AC
  Administered 2021-02-10: 5 mg via ORAL
  Filled 2021-02-10: qty 1

## 2021-02-10 MED ORDER — PREDNISONE 20 MG PO TABS
60.0000 mg | ORAL_TABLET | Freq: Once | ORAL | Status: AC
  Administered 2021-02-10: 60 mg via ORAL
  Filled 2021-02-10: qty 3

## 2021-02-10 MED ORDER — DOXYCYCLINE HYCLATE 100 MG PO CAPS: 100.0000 mg | ORAL_CAPSULE | Freq: Two times a day (BID) | ORAL | 0 refills | Status: DC

## 2021-02-10 MED ORDER — ALBUTEROL SULFATE HFA 108 (90 BASE) MCG/ACT IN AERS
4.0000 | INHALATION_SPRAY | Freq: Once | RESPIRATORY_TRACT | Status: AC
  Administered 2021-02-10: 4 via RESPIRATORY_TRACT
  Filled 2021-02-10: qty 6.7

## 2021-02-10 MED ORDER — KETOROLAC TROMETHAMINE 60 MG/2ML IM SOLN
60.0000 mg | Freq: Once | INTRAMUSCULAR | Status: AC
  Administered 2021-02-10: 60 mg via INTRAMUSCULAR
  Filled 2021-02-10: qty 2

## 2021-02-10 MED ORDER — PREDNISONE 20 MG PO TABS: ORAL_TABLET | ORAL | 0 refills | Status: DC

## 2021-02-10 NOTE — ED Notes (Signed)
Discharge instructions discussed with pt. Pt verbalized understanding. Pt stable and ambulatory. No signature pad available. 

## 2021-02-10 NOTE — ED Provider Notes (Signed)
[facility] EMERGENCY DEPARTMENT Provider Note   CSN: [id number] Arrival date & time: 02/09/21  1659     History Chief Complaint  Patient presents with   Chest Pain    Jacarri Gesner is a 50 y.o. male.   Chest Pain Pain location:  Substernal area, L chest and R chest Pain quality: aching and sharp   Pain radiates to:  Does not radiate Pain severity:  Mild Onset quality:  Gradual Timing:  Intermittent Progression:  Waxing and waning Chronicity:  New Context: breathing   Relieved by:  None tried Worsened by:  Coughing, smoking, deep breathing and movement Ineffective treatments:  Aspirin Associated symptoms: cough and heartburn   Associated symptoms: no abdominal pain, no anorexia, no anxiety, no nausea, no numbness, no orthopnea, no vomiting and no weakness   Risk factors: hypertension and smoking       Past Medical History:  Diagnosis Date   Anxiety state, unspecified 11/09/2012   Hypertension     Patient Active Problem List   Diagnosis Date Noted   Anxiety state, unspecified 11/09/2012   Internal hemorrhoids with prolapse 11/09/2012    Past Surgical History:  Procedure Laterality Date   HIP SURGERY         Family History  Problem Relation Age of Onset   Hypertension Father    Hypertension Brother    Cancer Maternal Grandmother        unknown to pt    Social History   Tobacco Use   Smoking status: Every Day    Packs/day: 1.00    Pack years: 0.00    Types: Cigarettes   Smokeless tobacco: Never  Substance Use Topics   Alcohol use: Yes   Drug use: No    Home Medications Prior to Admission medications   Medication Sig Start Date End Date Taking? Authorizing Provider  amLODipine (NORVASC) 5 MG tablet Take 1 tablet (5 mg total) by mouth daily. 02/10/21  Yes Selvin Yun, Corene Cornea, MD  doxycycline (VIBRAMYCIN) 100 MG capsule Take 1 capsule (100 mg total) by mouth 2 (two) times daily. One po bid x 7 days 02/10/21  Yes Latanya Hemmer, Corene Cornea, MD   Magnesium Oxide 400 MG CAPS Take 1 capsule (400 mg total) by mouth daily for 7 days. 02/10/21 02/17/21 Yes Keighan Amezcua, Corene Cornea, MD  naproxen sodium (ALEVE) 220 MG tablet Take 1,320 mg by mouth daily as needed (pain).   Yes [provider]  predniSONE (DELTASONE) 20 MG tablet 2 tabs po daily x 4 days 02/10/21  Yes Monte Bronder, Corene Cornea, MD    Allergies    Patient has no known allergies.  Review of Systems   Review of Systems  Respiratory:  Positive for cough.   Cardiovascular:  Positive for chest pain. Negative for orthopnea.  Gastrointestinal:  Positive for heartburn. Negative for abdominal pain, anorexia, nausea and vomiting.  Neurological:  Negative for weakness and numbness.  All other systems reviewed and are negative.  Physical Exam Updated Vital Signs BP (!) 158/90 (BP Location: Right Arm)   Pulse 60   Temp 98.2 F (36.8 C) (Oral)   Resp 18   SpO2 97%   Physical Exam Vitals and nursing note reviewed.  Constitutional:      Appearance: He is well-developed.  HENT:     Head: Normocephalic and atraumatic.  Cardiovascular:     Rate and Rhythm: Normal rate.  Pulmonary:     Effort: Pulmonary effort is normal. No respiratory distress.     Breath sounds: Decreased breath  sounds, wheezing and rhonchi present.  Chest:     Chest wall: No mass or tenderness.  Abdominal:     General: There is no distension or abdominal bruit.     Palpations: Abdomen is soft. There is no fluid wave.  Musculoskeletal:        General: Normal range of motion.     Cervical back: Normal range of motion.  Skin:    General: Skin is warm and dry.  Neurological:     Mental Status: He is alert.    ED Results / Procedures / Treatments   Labs (all labs ordered are listed, but only abnormal results are displayed) Labs Reviewed  BASIC METABOLIC PANEL - Abnormal; Notable for the following components:      Result Value   BUN 21 (*)    All other components within normal limits  CBC - Abnormal; Notable for  the following components:   WBC 11.4 (*)    RBC 4.09 (*)    Hemoglobin 10.3 (*)    HCT 31.6 (*)    MCV 77.3 (*)    MCH 25.2 (*)    RDW 24.2 (*)    All other components within normal limits  MAGNESIUM - Abnormal; Notable for the following components:   Magnesium 1.6 (*)    All other components within normal limits  SARS CORONAVIRUS 2 (TAT 6-24 HRS)  TROPONIN I (HIGH SENSITIVITY)  TROPONIN I (HIGH SENSITIVITY)    EKG EKG Interpretation  Date/Time:  Sunday February 09 2021 17:03:40 EDT Ventricular Rate:  75 PR Interval:  150 QRS Duration: 94 QT Interval:  408 QTC Calculation: 455 R Axis:   15 Text Interpretation: Normal sinus rhythm Septal infarct , age undetermined Abnormal ECG Confirmed by Merrily Pew [phone]) on 02/10/2021 1:49:17 AM  Radiology DG Chest 2 View  Result Date: 02/09/2021 CLINICAL DATA:  Chest pain EXAM: CHEST - 2 VIEW COMPARISON:  None. FINDINGS: The heart size and mediastinal contours are within normal limits. No focal consolidation. No pleural effusion. No pneumothorax. The visualized skeletal structures are unremarkable. IMPRESSION: No active cardiopulmonary disease. Electronically Signed   By: Dahlia Bailiff MD   On: 02/09/2021 18:02    Procedures Procedures   Medications Ordered in ED Medications  oxyCODONE-acetaminophen (PERCOCET/ROXICET) 5-325 MG per tablet 1 tablet (0 tablets Oral Hold 02/10/21 0328)  amLODipine (NORVASC) tablet 5 mg (5 mg Oral Given 02/10/21 0325)  predniSONE (DELTASONE) tablet 60 mg (60 mg Oral Given 02/10/21 0325)  albuterol (VENTOLIN HFA) 108 (90 Base) MCG/ACT inhaler 4 puff (4 puffs Inhalation Given 02/10/21 0329)  ketorolac (TORADOL) injection 60 mg (60 mg Intramuscular Given 02/10/21 0326)  doxycycline (VIBRA-TABS) tablet 100 mg (100 mg Oral Given 02/10/21 0325)  magnesium oxide (MAG-OX) tablet 400 mg (400 mg Oral Given 02/10/21 3354)    ED Course  I have reviewed the triage vital signs and the nursing notes.  Pertinent labs &  imaging results that were available during my care of the patient were reviewed by me and considered in my medical decision making (see chart for details).    MDM Rules/Calculators/A&P                          I suspect patient is most likely related to bronchitis.  I do not think he is having an a coronary event, blood clot or severe pneumonia requiring hospitalization or further work-up at this time.  Patient's been observed emergency room for over 12 hours.  There was delay in obtaining labs but ultimately these were reassuring aside from hypomagnesemia which he is being repleted 4.  Secondary to his long smoking history and the bronchitis type symptoms we will go ahead and start antibiotics.  Inhaler given here.  We will also do a burst of prednisone. PCP follow up for same.   Final Clinical Impression(s) / ED Diagnoses Final diagnoses:  Nonspecific chest pain    Rx / DC Orders ED Discharge Orders          Ordered    Magnesium Oxide 400 MG CAPS  Daily        02/10/21 0640    predniSONE (DELTASONE) 20 MG tablet        02/10/21 0640    doxycycline (VIBRAMYCIN) 100 MG capsule  2 times daily        02/10/21 0640    amLODipine (NORVASC) 5 MG tablet  Daily        02/10/21 0640             Terita Hejl, Corene Cornea, MD 02/10/21 [phone]

## 2021-02-10 NOTE — Discharge Instructions (Signed)
Use your inhaler every 6 hours or more often as needed for cough or shortness of breath. Follow up with primary doctor (number for help attaining PCP care is in your paperwork). Return here if worsening symptoms.

## 2021-03-15 ENCOUNTER — Emergency Department (HOSPITAL_COMMUNITY)
Admission: EM | Admit: 2021-03-15 | Discharge: 2021-03-15 | Disposition: A | Discharge status: Home / Self Care | Payer: 59 | Attending: Emergency Medicine | Admitting: Emergency Medicine

## 2021-03-15 ENCOUNTER — Emergency Department (HOSPITAL_COMMUNITY): Payer: 59

## 2021-03-15 ENCOUNTER — Encounter (HOSPITAL_COMMUNITY): Payer: Self-pay | Admitting: Emergency Medicine

## 2021-03-15 ENCOUNTER — Other Ambulatory Visit: Payer: Self-pay

## 2021-03-15 DIAGNOSIS — F17210 Nicotine dependence, cigarettes, uncomplicated: Secondary | ICD-10-CM | POA: Insufficient documentation

## 2021-03-15 DIAGNOSIS — M940 Chondrocostal junction syndrome [Tietze]: Secondary | ICD-10-CM | POA: Diagnosis not present

## 2021-03-15 DIAGNOSIS — J209 Acute bronchitis, unspecified: Secondary | ICD-10-CM | POA: Diagnosis not present

## 2021-03-15 DIAGNOSIS — I10 Essential (primary) hypertension: Secondary | ICD-10-CM | POA: Insufficient documentation

## 2021-03-15 DIAGNOSIS — R0789 Other chest pain: Secondary | ICD-10-CM | POA: Diagnosis not present

## 2021-03-15 DIAGNOSIS — J40 Bronchitis, not specified as acute or chronic: Secondary | ICD-10-CM

## 2021-03-15 DIAGNOSIS — R079 Chest pain, unspecified: Secondary | ICD-10-CM | POA: Diagnosis present

## 2021-03-15 LAB — CBC
HCT: 36.4 % — ABNORMAL LOW (ref 39.0–52.0)
Hemoglobin: 11.3 g/dL — ABNORMAL LOW (ref 13.0–17.0)
MCH: 25.3 pg — ABNORMAL LOW (ref 26.0–34.0)
MCHC: 31 g/dL (ref 30.0–36.0)
MCV: 81.6 fL (ref 80.0–100.0)
Platelets: 435 10*3/uL — ABNORMAL HIGH (ref 150–400)
RBC: 4.46 MIL/uL (ref 4.22–5.81)
RDW: 21.3 % — ABNORMAL HIGH (ref 11.5–15.5)
WBC: 10.1 10*3/uL (ref 4.0–10.5)
nRBC: 0 % (ref 0.0–0.2)

## 2021-03-15 LAB — TROPONIN I (HIGH SENSITIVITY)
Troponin I (High Sensitivity): 5 ng/L (ref <18)
Troponin I (High Sensitivity): 5 ng/L (ref <18)

## 2021-03-15 LAB — BASIC METABOLIC PANEL
Anion gap: 10 (ref 5–15)
BUN: 13 mg/dL (ref 6–20)
CO2: 22 mmol/L (ref 22–32)
Calcium: 8.9 mg/dL (ref 8.9–10.3)
Chloride: 108 mmol/L (ref 98–111)
Creatinine, Ser: 1.02 mg/dL (ref 0.61–1.24)
GFR, Estimated: >60 mL/min (ref >60)
Glucose, Bld: 79 mg/dL (ref 70–99)
Potassium: 3.3 mmol/L — ABNORMAL LOW (ref 3.5–5.1)
Sodium: 140 mmol/L (ref 135–145)

## 2021-03-15 MED ORDER — GUAIFENESIN 100 MG/5ML PO SOLN: 5.0000 mL | ORAL | 0 refills | Status: DC | PRN

## 2021-03-15 MED ORDER — KETOROLAC TROMETHAMINE 15 MG/ML IJ SOLN
15.0000 mg | Freq: Once | INTRAMUSCULAR | Status: AC
  Administered 2021-03-15: 15 mg via INTRAVENOUS
  Filled 2021-03-15: qty 1

## 2021-03-15 MED ORDER — IBUPROFEN 600 MG PO TABS: 600.0000 mg | ORAL_TABLET | Freq: Four times a day (QID) | ORAL | 0 refills | Status: DC | PRN

## 2021-03-15 MED ORDER — METHOCARBAMOL 500 MG PO TABS: 1000.0000 mg | ORAL_TABLET | Freq: Two times a day (BID) | ORAL | 0 refills | Status: DC

## 2021-03-15 MED ORDER — SODIUM CHLORIDE 0.9 % IV BOLUS
1000.0000 mL | Freq: Once | INTRAVENOUS | Status: AC
  Administered 2021-03-15: 1000 mL via INTRAVENOUS

## 2021-03-15 MED ORDER — METHOCARBAMOL 500 MG PO TABS: 1000.0000 mg | ORAL_TABLET | Freq: Once | ORAL | Status: DC

## 2021-03-15 NOTE — ED Provider Notes (Signed)
[facility] EMERGENCY DEPARTMENT Provider Note   CSN: [id number] Arrival date & time: 03/15/21  4166     History Chief Complaint  Patient presents with   Chest Pain    Christopher Henry is a 50 y.o. male.  This is a 50 yo male presenting to ED for complaint of chest pain. Hx as below including HTN, tobacco smoker.  Patient reports over the past month since being diagnosed of bronchitis has been experiencing chest tightness with turning and twisting her torso.  Feels that his muscles in his chest are tight.  No pain at rest.  No pain with exertion or climbing stairs.  He lifts his arm over his head or twist in his chair to look behind him it exacerbates the pain.  No palpitations, no dyspnea, no lightheadedness or diaphoresis, no nausea or vomiting.  He is tolerant oral intake with difficulty.  Still having a dry cough although it has been slowly improving.  No falls or sensation of passing out.  No lightheadedness, numbness or tingling.  No other acute complaints offered.   The history is provided by the patient. No language interpreter was used.  Chest Pain Associated symptoms: cough   Associated symptoms: no abdominal pain, no back pain, no fever, no palpitations, no shortness of breath and no vomiting       Past Medical History:  Diagnosis Date   Anxiety state, unspecified 11/09/2012   Hypertension     Patient Active Problem List   Diagnosis Date Noted   Anxiety state, unspecified 11/09/2012   Internal hemorrhoids with prolapse 11/09/2012    Past Surgical History:  Procedure Laterality Date   HIP SURGERY         Family History  Problem Relation Age of Onset   Hypertension Father    Hypertension Brother    Cancer Maternal Grandmother        unknown to pt    Social History   Tobacco Use   Smoking status: Every Day    Packs/day: 1.00    Types: Cigarettes   Smokeless tobacco: Never  Substance Use Topics   Alcohol use: Yes   Drug use: No     Home Medications Prior to Admission medications   Medication Sig Start Date End Date Taking? Authorizing Provider  amLODipine (NORVASC) 5 MG tablet Take 1 tablet (5 mg total) by mouth daily. 02/10/21  Yes Mesner, Corene Cornea, MD  guaiFENesin (ROBITUSSIN) 100 MG/5ML SOLN Take 5 mLs (100 mg total) by mouth every 4 (four) hours as needed for cough or to loosen phlegm. 03/15/21  Yes Wynona Dove A, DO  ibuprofen (ADVIL) 600 MG tablet Take 1 tablet (600 mg total) by mouth every 6 (six) hours as needed. 03/15/21  Yes Wynona Dove A, DO  MAGNESIUM PO Take 1 tablet by mouth daily.   Yes [provider]  methocarbamol (ROBAXIN) 500 MG tablet Take 2 tablets (1,000 mg total) by mouth 2 (two) times daily. 03/15/21  Yes Wynona Dove A, DO  doxycycline (VIBRAMYCIN) 100 MG capsule Take 1 capsule (100 mg total) by mouth 2 (two) times daily. One po bid x 7 days Patient not taking: No sig reported 02/10/21   Mesner, Corene Cornea, MD  predniSONE (DELTASONE) 20 MG tablet 2 tabs po daily x 4 days Patient not taking: Reported on 03/15/2021 02/10/21   Mesner, Corene Cornea, MD    Allergies    Patient has no known allergies.  Review of Systems   Review of Systems  Constitutional:  Negative for chills and fever.  HENT:  Negative for ear pain and sore throat.   Eyes:  Negative for pain and visual disturbance.  Respiratory:  Positive for cough and chest tightness. Negative for shortness of breath.   Cardiovascular:  Negative for palpitations and leg swelling.  Gastrointestinal:  Negative for abdominal pain and vomiting.  Genitourinary:  Negative for dysuria and hematuria.  Musculoskeletal:  Negative for arthralgias and back pain.  Skin:  Negative for color change and rash.  Neurological:  Negative for seizures and syncope.  All other systems reviewed and are negative.  Physical Exam Updated Vital Signs BP (!) 151/86   Pulse 67   Temp 98.4 F (36.9 C) (Oral)   Resp 18   SpO2 100%   Physical Exam Vitals and nursing  note reviewed.  Constitutional:      General: He is not in acute distress.    Appearance: He is well-developed.  HENT:     Head: Normocephalic and atraumatic.     Right Ear: External ear normal.     Left Ear: External ear normal.     Mouth/Throat:     Mouth: Mucous membranes are moist.  Eyes:     General: No scleral icterus. Cardiovascular:     Rate and Rhythm: Normal rate and regular rhythm.     Pulses: Normal pulses.     Heart sounds: Normal heart sounds.  Pulmonary:     Effort: Pulmonary effort is normal. No respiratory distress.     Breath sounds: Normal breath sounds. No decreased breath sounds or wheezing.  Chest:     Comments: Pain reproducible upon palpation Abdominal:     General: Abdomen is flat.     Palpations: Abdomen is soft.     Tenderness: There is no abdominal tenderness.  Musculoskeletal:        General: Normal range of motion.     Cervical back: Normal range of motion.     Right lower leg: No edema.     Left lower leg: No edema.  Skin:    General: Skin is warm and dry.     Capillary Refill: Capillary refill takes less than 2 seconds.  Neurological:     Mental Status: He is alert and oriented to person, place, and time.  Psychiatric:        Mood and Affect: Mood normal.        Behavior: Behavior normal.    ED Results / Procedures / Treatments   Labs (all labs ordered are listed, but only abnormal results are displayed) Labs Reviewed  BASIC METABOLIC PANEL - Abnormal; Notable for the following components:      Result Value   Potassium 3.3 (*)    All other components within normal limits  CBC - Abnormal; Notable for the following components:   Hemoglobin 11.3 (*)    HCT 36.4 (*)    MCH 25.3 (*)    RDW 21.3 (*)    Platelets 435 (*)    All other components within normal limits  TROPONIN I (HIGH SENSITIVITY)  TROPONIN I (HIGH SENSITIVITY)    EKG EKG Interpretation  Date/Time:  Saturday March 15 2021 09:56:16 EDT Ventricular Rate:  86 PR  Interval:  148 QRS Duration: 96 QT Interval:  398 QTC Calculation: 476 R Axis:   -10 Text Interpretation: Normal sinus rhythm Early repolarization pattern No change from prior tracing Confirmed by Wynona Dove ([id number]) on 03/15/2021 11:15:15 AM  Radiology DG Chest 2 View  Result Date:  03/15/2021 CLINICAL DATA:  Chest pain, shortness of breath and cough for several days. EXAM: CHEST - 2 VIEW COMPARISON:  Chest x-ray dated 02/09/2021. FINDINGS: Heart size and mediastinal contours are within normal limits. Chronic bronchitic changes noted centrally. Lungs otherwise clear. No pleural effusion or pneumothorax is seen. Chronic-appearing compression deformity of a midthoracic vertebral body. No acute-appearing osseous abnormality. IMPRESSION: 1. No active cardiopulmonary disease. No evidence of pneumonia or pulmonary edema. 2. Chronic bronchitic changes. Electronically Signed   By: Franki Cabot M.D.   On: 03/15/2021 10:50    Procedures Procedures   Medications Ordered in ED Medications  ketorolac (TORADOL) 15 MG/ML injection 15 mg (15 mg Intravenous Given 03/15/21 1220)  sodium chloride 0.9 % bolus 1,000 mL (0 mLs Intravenous Stopped 03/15/21 1321)    ED Course  I have reviewed the triage vital signs and the nursing notes.  Pertinent labs & imaging results that were available during my care of the patient were reviewed by me and considered in my medical decision making (see chart for details).    MDM Rules/Calculators/A&P  50 year old male complains of chest tightness associated bronchitis.  Patient is currently asymptomatic.  Vital signs reviewed and are stable.  Serious etiology considered.  ECG reviewed and does not demonstrate evidence of acute ischemia, no STEMI. Troponin without elevation.  CXR reviewed and is consistent with chronic bronchitis.  Labs reviewed and are stable  Favor costochondritis associated with his bronchitis as etiology of presenting complaint. No current chest  pain. Given anti-inflammatory medications, cough suppressant. Close o/p f/u with PCP.     The patient improved significantly and was discharged in stable condition. Detailed discussions were had with the patient regarding current findings, and need for close f/u with PCP or on call doctor. The patient has been instructed to return immediately if the symptoms worsen in any way for re-evaluation. Patient verbalized understanding and is in agreement with current care plan. All questions answered prior to discharge.     Final Clinical Impression(s) / ED Diagnoses Final diagnoses:  Bronchitis  Atypical chest pain  Costochondritis    Rx / DC Orders ED Discharge Orders          Ordered    guaiFENesin (ROBITUSSIN) 100 MG/5ML SOLN  Every 4 hours PRN        03/15/21 1404    methocarbamol (ROBAXIN) 500 MG tablet  2 times daily        03/15/21 1404    ibuprofen (ADVIL) 600 MG tablet  Every 6 hours PRN        03/15/21 1404             Jeanell Sparrow, DO 03/15/21 2008

## 2021-03-15 NOTE — ED Notes (Signed)
Pt verbalized understanding of d/c instructions, meds and followup care. Denies questions. VSS, no distress noted. Steady gait to exit.  

## 2021-03-15 NOTE — ED Notes (Signed)
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 RN started IV, gave IVF and meds. Pt declined robaxin muscle relaxer as he was concerned it would make him too tired to drive. Pt resting comfortably in stretcher, VSS, no distress noted. Pt denies needs, call light in reach.

## 2021-03-15 NOTE — ED Triage Notes (Signed)
Pt reports burning across chest since being diagnosed with bronchitis 1 month ago.  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  States he went to work this morning and told them he didn't feel good and needed to go home and they told him he had to get a work note.

## 2021-03-27 ENCOUNTER — Ambulatory Visit: Payer: 59 | Admitting: Nurse Practitioner

## 2021-03-27 ENCOUNTER — Encounter: Payer: Self-pay | Admitting: Nurse Practitioner

## 2021-03-27 ENCOUNTER — Other Ambulatory Visit (HOSPITAL_COMMUNITY)
Admission: RE | Admit: 2021-03-27 | Discharge: 2021-03-27 | Disposition: A | Discharge status: Home / Self Care | Payer: 59 | Source: Ambulatory Visit | Attending: Nurse Practitioner | Admitting: Nurse Practitioner

## 2021-03-27 ENCOUNTER — Other Ambulatory Visit: Payer: Self-pay

## 2021-03-27 VITALS — BP 142/70 | HR 92 | Temp 97.9°F | Ht 73.8 in | Wt 139.0 lb

## 2021-03-27 DIAGNOSIS — J40 Bronchitis, not specified as acute or chronic: Secondary | ICD-10-CM

## 2021-03-27 DIAGNOSIS — Z720 Tobacco use: Secondary | ICD-10-CM

## 2021-03-27 DIAGNOSIS — N4889 Other specified disorders of penis: Secondary | ICD-10-CM

## 2021-03-27 DIAGNOSIS — K648 Other hemorrhoids: Secondary | ICD-10-CM

## 2021-03-27 DIAGNOSIS — I10 Essential (primary) hypertension: Secondary | ICD-10-CM

## 2021-03-27 DIAGNOSIS — R21 Rash and other nonspecific skin eruption: Secondary | ICD-10-CM

## 2021-03-27 DIAGNOSIS — Z7689 Persons encountering health services in other specified circumstances: Secondary | ICD-10-CM | POA: Diagnosis not present

## 2021-03-27 DIAGNOSIS — F419 Anxiety disorder, unspecified: Secondary | ICD-10-CM

## 2021-03-27 LAB — POCT URINALYSIS DIPSTICK
Bilirubin, UA: NEGATIVE
Blood, UA: NEGATIVE
Glucose, UA: NEGATIVE
Leukocytes, UA: NEGATIVE
Nitrite, UA: NEGATIVE
Protein, UA: POSITIVE — AB
Spec Grav, UA: >=1.03 — AB (ref 1.010–1.025)
Urobilinogen, UA: 0.2 E.U./dL
pH, UA: 5.5 (ref 5.0–8.0)

## 2021-03-27 MED ORDER — TRIAMCINOLONE ACETONIDE 40 MG/ML IJ SUSP
40.0000 mg | Freq: Once | INTRAMUSCULAR | Status: AC
  Administered 2021-03-27: 40 mg via INTRAMUSCULAR

## 2021-03-27 MED ORDER — AMLODIPINE BESYLATE 5 MG PO TABS: 5.0000 mg | ORAL_TABLET | Freq: Every day | ORAL | 1 refills | Status: AC

## 2021-03-27 MED ORDER — BETAMETHASONE DIPROPIONATE 0.05 % EX OINT: TOPICAL_OINTMENT | Freq: Two times a day (BID) | CUTANEOUS | 0 refills | Status: AC

## 2021-03-27 NOTE — Progress Notes (Signed)
I,Yamilka Roman [employer] as a Education administrator for [employer], NP.,have documented all relevant documentation on the behalf of [employer], NP,as directed by  Bary Castilla, NP while in the presence of Bary Castilla, NP.  This visit occurred during the SARS-CoV-2 public health emergency.  Safety protocols were in place, including screening questions prior to the visit, additional usage of staff PPE, and extensive cleaning of exam room while observing appropriate contact time as indicated for disinfecting solutions.  Subjective:     Patient ID: Christopher Henry , male    DOB: 05-25-1971 , 50 y.o.   MRN: [id number]   Chief Complaint  Patient presents with   Establish Care   Hemorrhoids   Bronchitis    HPI  He last saw provider in 2014. He was seeing Mirian Mo. He works as Freight forwarder. He is a smoker. He has been smoking for 35 years a pack a day. He was recently in the ED last month for bronchitis and costochondritis. He works as a Architectural technologist and states that his nature of work causes pain and pressure to his chest area. He is here with FMLA work. He also has some other issues such as penile irritation, rash and refill for his BP medication. He also has been having some internal hemorrhoids for which he would like to see a GI specialist for. He also has anxiety from an choking episode he had growing up. He would like to talk to a therapist in regards to it.  Patient has FMLA paperwork with him to be filled out for the time he was out of work due to his chronic bronchitis and costochondritis pain.     Past Medical History:  Diagnosis Date   Anxiety state, unspecified 11/09/2012   Hypertension      Family History  Problem Relation Age of Onset   Hypertension Brother    Cancer Maternal Grandmother        unknown to pt     Current Outpatient Medications:    betamethasone dipropionate (DIPROLENE) 0.05 % ointment, Apply topically 2 (two) times daily., Disp: 30 g, Rfl: 0    ibuprofen (ADVIL) 600 MG tablet, Take 1 tablet (600 mg total) by mouth every 6 (six) hours as needed., Disp: 30 tablet, Rfl: 0   amLODipine (NORVASC) 5 MG tablet, Take 1 tablet (5 mg total) by mouth daily., Disp: 30 tablet, Rfl: 1   No Known Allergies   Review of Systems  Constitutional:  Positive for appetite change. Negative for chills, fatigue and fever.  HENT:  Negative for congestion, rhinorrhea and sinus pain.   Eyes: Negative.   Respiratory:  Negative for cough, shortness of breath and wheezing.   Cardiovascular:  Negative for chest pain and palpitations.  Gastrointestinal:  Negative for abdominal pain, diarrhea and nausea.  Genitourinary:        Penile irritation   Musculoskeletal:  Negative for arthralgias and myalgias.  Skin: Negative.   Neurological:  Negative for dizziness, tremors and headaches.  Psychiatric/Behavioral: Negative.      Today's Vitals   03/27/21 1132  BP: (!) 142/70  Pulse: 92  Temp: 97.9 F (36.6 C)  Weight: 139 lb (63 kg)  Height: 6' 1.8" (1.875 m)  PainSc: 6   PainLoc: Chest   Body mass index is 17.94 kg/m.   Objective:  Physical Exam Constitutional:      Appearance: Normal appearance.  HENT:     Head: Normocephalic and atraumatic.  Cardiovascular:     Rate and Rhythm:  Normal rate and regular rhythm.     Pulses: Normal pulses.     Heart sounds: Normal heart sounds. No murmur heard. Pulmonary:     Effort: Pulmonary effort is normal. No respiratory distress.     Breath sounds: Normal breath sounds. No wheezing.  Skin:    General: Skin is warm and dry.     Capillary Refill: Capillary refill takes less than 2 seconds.  Neurological:     Mental Status: He is alert and oriented to person, place, and time.        Assessment And Plan:     1. Establishing care with new doctor, encounter for -Patient is here to establish care. Martin Majestic over patient medical, family, social and surgical history. -Reviewed with patient their medications and  any allergies  -Reviewed with patient their sexual orientation, drug/tobacco and alcohol use -Dicussed any new concerns with patient  -recommended patient comes in for a physical exam and complete blood work.  -Educated patient about the importance of annual screenings and immunizations.  -Advised patient to eat a healthy diet along with exercise for atleast 30-45 min atleast 4-5 days of the week.   2. Bronchitis with chronic wheezing - Ambulatory referral to Pulmonology -Recent Chest xray on 03/15/21 shows chronic bronchitic changes.  - triamcinolone acetonide (KENALOG-40) injection 40 mg  3. Essential hypertension -Limit the intake of processed foods and salt intake. You should increase your intake of green vegetables and fruits. Limit the use of alcohol. Limit fast foods and fried foods. Avoid high fatty saturated and trans fat foods. Keep yourself hydrated with drinking water. Avoid red meats. Eat lean meats instead. Exercise for atleast 30-45 min for atleast 4-5 times a week.  - amLODipine (NORVASC) 5 MG tablet; Take 1 tablet (5 mg total) by mouth daily.  Dispense: 30 tablet; Refill: 1  4. Penile irritation - betamethasone dipropionate (DIPROLENE) 0.05 % ointment; Apply topically 2 (two) times daily.  Dispense: 30 g; Refill: 0 - Urine cytology ancillary only - POCT Urinalysis Dipstick (81002)  5. Tobacco abuse -Ready to quit: yes  Counseling given:yes  -Chest xray on 03/03/21 shows chronic bronchitic changes.  -Counseled patient about the importance of quitting smoke and the harms of it to this health.   6. Rash - triamcinolone acetonide (KENALOG-40) injection 40 mg  7. Internal hemorrhoids with prolapse - Ambulatory referral to Gastroenterology   8. Anxiety - Ambulatory referral to Psychology   The patient was encouraged to call or send a message through [place] for any questions or concerns.   Follow up: if symptoms persist or do not get better.   Side effects and  appropriate use of all the medication(s) were discussed with the patient today. Patient advised to use the medication(s) as directed by their healthcare provider. The patient was encouraged to read, review, and understand all associated package inserts and contact our office with any questions or concerns. The patient accepts the risks of the treatment plan and had an opportunity to ask questions.   Staying healthy and adopting a healthy lifestyle for your overall health is important. You should eat 7 or more servings of fruits and vegetables per day. You should drink plenty of water to keep yourself hydrated and your kidneys healthy. This includes about 65-80+ fluid ounces of water. Limit your intake of animal fats especially for elevated cholesterol. Avoid highly processed food and limit your salt intake if you have hypertension. Avoid foods high in saturated/Trans fats. Along with a healthy diet it  is also very important to maintain time for yourself to maintain a healthy mental health with low stress levels. You should get atleast 150 min of moderate intensity exercise weekly for a healthy heart. Along with eating right and exercising, aim for at least 7-9 hours of sleep daily.  Eat more whole grains which includes barley, wheat berries, oats, brown rice and whole wheat pasta. Use healthy plant oils which include olive, soy, corn, sunflower and peanut. Limit your caffeine and sugary drinks. Limit your intake of fast foods. Limit milk and dairy products to one or two daily servings.   Patient was given opportunity to ask questions. Patient verbalized understanding of the plan and was able to repeat key elements of the plan. All questions were answered to their satisfaction.  Raman Gaston Dase, DNP   I, Raman Leeon Makar have reviewed all documentation for this visit. The documentation on 03/27/21 for the exam, diagnosis, procedures, and orders are all accurate and complete.    IF YOU HAVE BEEN REFERRED TO A  SPECIALIST, IT MAY TAKE 1-2 WEEKS TO SCHEDULE/PROCESS THE REFERRAL. IF YOU HAVE NOT HEARD FROM US/SPECIALIST IN TWO WEEKS, PLEASE GIVE [place] A CALL AT [phone].   THE PATIENT IS ENCOURAGED TO PRACTICE SOCIAL DISTANCING DUE TO THE COVID-19 PANDEMIC.

## 2021-03-31 LAB — URINE CYTOLOGY ANCILLARY ONLY
Chlamydia: NEGATIVE
Comment: NEGATIVE
Comment: NEGATIVE
Comment: NORMAL
Neisseria Gonorrhea: NEGATIVE
Trichomonas: NEGATIVE

## 2021-04-15 ENCOUNTER — Emergency Department (HOSPITAL_COMMUNITY): Payer: 59

## 2021-04-15 ENCOUNTER — Emergency Department (HOSPITAL_COMMUNITY)
Admission: EM | Admit: 2021-04-15 | Discharge: 2021-04-15 | Disposition: A | Discharge status: Home / Self Care | Payer: 59 | Attending: Emergency Medicine | Admitting: Emergency Medicine

## 2021-04-15 ENCOUNTER — Encounter: Payer: Self-pay | Admitting: Nurse Practitioner

## 2021-04-15 ENCOUNTER — Encounter (HOSPITAL_COMMUNITY): Payer: Self-pay | Admitting: Emergency Medicine

## 2021-04-15 ENCOUNTER — Other Ambulatory Visit: Payer: Self-pay

## 2021-04-15 DIAGNOSIS — Z79899 Other long term (current) drug therapy: Secondary | ICD-10-CM | POA: Insufficient documentation

## 2021-04-15 DIAGNOSIS — F17210 Nicotine dependence, cigarettes, uncomplicated: Secondary | ICD-10-CM | POA: Insufficient documentation

## 2021-04-15 DIAGNOSIS — R059 Cough, unspecified: Secondary | ICD-10-CM | POA: Diagnosis present

## 2021-04-15 DIAGNOSIS — R042 Hemoptysis: Secondary | ICD-10-CM

## 2021-04-15 DIAGNOSIS — I10 Essential (primary) hypertension: Secondary | ICD-10-CM | POA: Insufficient documentation

## 2021-04-15 DIAGNOSIS — J441 Chronic obstructive pulmonary disease with (acute) exacerbation: Secondary | ICD-10-CM | POA: Insufficient documentation

## 2021-04-15 LAB — CBC WITH DIFFERENTIAL/PLATELET
Abs Immature Granulocytes: 0.09 10*3/uL — ABNORMAL HIGH (ref 0.00–0.07)
Basophils Absolute: 0.2 10*3/uL — ABNORMAL HIGH (ref 0.0–0.1)
Basophils Relative: 1 %
Eosinophils Absolute: 0.1 10*3/uL (ref 0.0–0.5)
Eosinophils Relative: 1 %
HCT: 38.1 % — ABNORMAL LOW (ref 39.0–52.0)
Hemoglobin: 12 g/dL — ABNORMAL LOW (ref 13.0–17.0)
Immature Granulocytes: 1 %
Lymphocytes Relative: 10 %
Lymphs Abs: 1.8 10*3/uL (ref 0.7–4.0)
MCH: 24.6 pg — ABNORMAL LOW (ref 26.0–34.0)
MCHC: 31.5 g/dL (ref 30.0–36.0)
MCV: 78.2 fL — ABNORMAL LOW (ref 80.0–100.0)
Monocytes Absolute: 1.4 10*3/uL — ABNORMAL HIGH (ref 0.1–1.0)
Monocytes Relative: 8 %
Neutro Abs: 14.4 10*3/uL — ABNORMAL HIGH (ref 1.7–7.7)
Neutrophils Relative %: 79 %
Platelets: 399 10*3/uL (ref 150–400)
RBC: 4.87 MIL/uL (ref 4.22–5.81)
RDW: 20.7 % — ABNORMAL HIGH (ref 11.5–15.5)
WBC: 18 10*3/uL — ABNORMAL HIGH (ref 4.0–10.5)
nRBC: 0 % (ref 0.0–0.2)

## 2021-04-15 LAB — BASIC METABOLIC PANEL
Anion gap: 12 (ref 5–15)
BUN: 14 mg/dL (ref 6–20)
CO2: 22 mmol/L (ref 22–32)
Calcium: 9.7 mg/dL (ref 8.9–10.3)
Chloride: 102 mmol/L (ref 98–111)
Creatinine, Ser: 0.84 mg/dL (ref 0.61–1.24)
GFR, Estimated: >60 mL/min (ref >60)
Glucose, Bld: 87 mg/dL (ref 70–99)
Potassium: 4.1 mmol/L (ref 3.5–5.1)
Sodium: 136 mmol/L (ref 135–145)

## 2021-04-15 MED ORDER — DEXAMETHASONE 1 MG/ML PO CONC: 6.0000 mg | Freq: Once | ORAL | Status: DC

## 2021-04-15 MED ORDER — ALBUTEROL SULFATE HFA 108 (90 BASE) MCG/ACT IN AERS
1.0000 | INHALATION_SPRAY | Freq: Once | RESPIRATORY_TRACT | Status: DC
Start: 2021-04-15 — End: 2021-04-16
  Filled 2021-04-15: qty 6.7

## 2021-04-15 MED ORDER — PREDNISONE 20 MG PO TABS: 20.0000 mg | ORAL_TABLET | Freq: Every day | ORAL | 0 refills | Status: DC

## 2021-04-15 MED ORDER — DEXAMETHASONE 4 MG PO TABS
6.0000 mg | ORAL_TABLET | Freq: Once | ORAL | Status: AC
  Administered 2021-04-15: 6 mg via ORAL
  Filled 2021-04-15: qty 2

## 2021-04-15 MED ORDER — PREDNISONE 20 MG PO TABS: 20.0000 mg | ORAL_TABLET | Freq: Every day | ORAL | 0 refills | Status: AC

## 2021-04-15 MED ORDER — DOXYCYCLINE HYCLATE 100 MG PO CAPS: 100.0000 mg | ORAL_CAPSULE | Freq: Two times a day (BID) | ORAL | 0 refills | Status: DC

## 2021-04-15 MED ORDER — ALBUTEROL SULFATE HFA 108 (90 BASE) MCG/ACT IN AERS
2.0000 | INHALATION_SPRAY | Freq: Once | RESPIRATORY_TRACT | Status: AC
  Administered 2021-04-15: 2 via RESPIRATORY_TRACT
  Filled 2021-04-15: qty 6.7

## 2021-04-15 MED ORDER — DOXYCYCLINE HYCLATE 100 MG PO TABS
100.0000 mg | ORAL_TABLET | Freq: Once | ORAL | Status: AC
  Administered 2021-04-15: 100 mg via ORAL
  Filled 2021-04-15: qty 1

## 2021-04-15 NOTE — ED Provider Notes (Signed)
[facility] EMERGENCY DEPARTMENT Provider Note   CSN: [id number] Arrival date & time: 04/15/21  4097     History Chief Complaint  Patient presents with   Cough    Christopher Henry is a 50 y.o. male.  Pt is a 50yo male presenting for cough. Patient admits to productive cough x 3 days. Denies fevers, chills, nausea vomiting, or diarrhea. Denies sick contacts. Patient is a current smoker. Denies chest pain or sob. Admits to scant blood in cough.   The history is provided by the patient. No language interpreter was used.  Cough Associated symptoms: no chest pain, no chills, no ear pain, no fever, no rash, no shortness of breath and no sore throat       Past Medical History:  Diagnosis Date   Anxiety state, unspecified 11/09/2012   Hypertension     Patient Active Problem List   Diagnosis Date Noted   Anxiety state, unspecified 11/09/2012   Internal hemorrhoids with prolapse 11/09/2012    Past Surgical History:  Procedure Laterality Date   FINGER SURGERY     HIP SURGERY         Family History  Problem Relation Age of Onset   Hypertension Brother    Cancer Maternal Grandmother        unknown to pt    Social History   Tobacco Use   Smoking status: Every Day    Packs/day: 1.00    Types: Cigarettes   Smokeless tobacco: Never  Substance Use Topics   Alcohol use: Yes    Comment: 1 pint a day   Drug use: No    Home Medications Prior to Admission medications   Medication Sig Start Date End Date Taking? Authorizing Provider  amLODipine (NORVASC) 5 MG tablet Take 1 tablet (5 mg total) by mouth daily. 03/27/21   Bary Castilla, NP  betamethasone dipropionate (DIPROLENE) 0.05 % ointment Apply topically 2 (two) times daily. 03/27/21   Bary Castilla, NP  ibuprofen (ADVIL) 600 MG tablet Take 1 tablet (600 mg total) by mouth every 6 (six) hours as needed. 03/15/21   Jeanell Sparrow, DO    Allergies    Patient has no known allergies.  Review of  Systems   Review of Systems  Constitutional:  Negative for chills and fever.  HENT:  Negative for ear pain and sore throat.   Eyes:  Negative for pain and visual disturbance.  Respiratory:  Positive for cough. Negative for shortness of breath.   Cardiovascular:  Negative for chest pain and palpitations.  Gastrointestinal:  Negative for abdominal pain and vomiting.  Genitourinary:  Negative for dysuria and hematuria.  Musculoskeletal:  Negative for arthralgias and back pain.  Skin:  Negative for color change and rash.  Neurological:  Negative for seizures and syncope.  All other systems reviewed and are negative.  Physical Exam Updated Vital Signs BP (!) 144/91 (BP Location: Right Arm)   Pulse 77   Temp 98.4 F (36.9 C) (Oral)   Resp 16   SpO2 100%   Physical Exam Vitals and nursing note reviewed.  Constitutional:      Appearance: He is well-developed.  HENT:     Head: Normocephalic and atraumatic.  Eyes:     Conjunctiva/sclera: Conjunctivae normal.  Cardiovascular:     Rate and Rhythm: Normal rate and regular rhythm.     Heart sounds: No murmur heard. Pulmonary:     Effort: Pulmonary effort is normal. No respiratory distress.  Breath sounds: Normal breath sounds.  Abdominal:     Palpations: Abdomen is soft.     Tenderness: There is no abdominal tenderness.  Musculoskeletal:     Cervical back: Neck supple.  Skin:    General: Skin is warm and dry.  Neurological:     Mental Status: He is alert.    ED Results / Procedures / Treatments   Labs (all labs ordered are listed, but only abnormal results are displayed) Labs Reviewed  CBC WITH DIFFERENTIAL/PLATELET - Abnormal; Notable for the following components:      Result Value   WBC 18.0 (*)    Hemoglobin 12.0 (*)    HCT 38.1 (*)    MCV 78.2 (*)    MCH 24.6 (*)    RDW 20.7 (*)    Neutro Abs 14.4 (*)    Monocytes Absolute 1.4 (*)    Basophils Absolute 0.2 (*)    Abs Immature Granulocytes 0.09 (*)    All other  components within normal limits  RESP PANEL BY RT-PCR (FLU A&B, COVID) [id number]  BASIC METABOLIC PANEL    EKG None  Radiology DG Chest 2 View  Result Date: 04/15/2021 CLINICAL DATA:  Shortness of breath EXAM: CHEST - 2 VIEW COMPARISON:  Chest radiograph 03/15/2021 FINDINGS: The cardiomediastinal silhouette is stable. There is no focal consolidation or pulmonary edema. There is no pleural effusion or pneumothorax. Compression deformity of a midthoracic vertebral body is unchanged. There is no acute osseous abnormality. IMPRESSION: No radiographic evidence of acute cardiopulmonary process. Given history of hemoptysis, consider CT chest for better evaluation of the lungs. Electronically Signed   By: Valetta Mole M.D.   On: 04/15/2021 10:11    Procedures Procedures   Medications Ordered in ED Medications  albuterol (VENTOLIN HFA) 108 (90 Base) MCG/ACT inhaler 1 puff (has no administration in time range)  dexamethasone (DECADRON) 1 MG/ML solution 6 mg (has no administration in time range)  albuterol (VENTOLIN HFA) 108 (90 Base) MCG/ACT inhaler 2 puff (has no administration in time range)  doxycycline (VIBRA-TABS) tablet 100 mg (has no administration in time range)    ED Course  I have reviewed the triage vital signs and the nursing notes.  Pertinent labs & imaging results that were available during my care of the patient were reviewed by me and considered in my medical decision making (see chart for details).    MDM Rules/Calculators/A&P                           8:07 PM 50yo male presenting for cough. Patient is Aox3, no acute distress, afebrile, with stable vitals.   -Laboratory studies demonstrate leukocytosis. No other signs/symptoms of sepsis.  -CXR demonstrates no acute process. No pneumonia. -CT PE offered for hemoptysis but declined.  -Pt declined Covid 19 PCR.   Pt current smoker with hx of morning coughs. States over the last 3 days coughs have become more productive in  nature. Symptoms likely secondary to COPD flare. Will treat with steroids, doxycyline, and albuterol. Meds given in ED and sent to pharmacy. Pt recommended to return to hemoptysis worsens.   Patient in no distress and overall condition improved here in the ED. Detailed discussions were had with the patient regarding current findings, and need for close f/u with PCP or on call doctor. The patient has been instructed to return immediately if the symptoms worsen in any way for re-evaluation. Patient verbalized understanding and is in agreement with  current care plan. All questions answered prior to discharge.      Final Clinical Impression(s) / ED Diagnoses Final diagnoses:  COPD exacerbation ([place])  Cough  Hemoptysis    Rx / DC Orders ED Discharge Orders     None        Lianne Cure, DO 62/56/38 2011

## 2021-04-15 NOTE — ED Triage Notes (Signed)
Pt states he feels SOB and has been coughing up blood over the past four days. Pt states he was diagnosed with chronic Bronchitis.

## 2021-04-24 ENCOUNTER — Other Ambulatory Visit: Payer: Self-pay

## 2021-04-24 ENCOUNTER — Ambulatory Visit: Payer: 59 | Admitting: Nurse Practitioner

## 2021-04-24 ENCOUNTER — Encounter: Payer: Self-pay | Admitting: Nurse Practitioner

## 2021-04-24 VITALS — BP 132/84 | HR 86 | Temp 98.9°F | Ht 73.0 in | Wt 133.0 lb

## 2021-04-24 DIAGNOSIS — J40 Bronchitis, not specified as acute or chronic: Secondary | ICD-10-CM | POA: Diagnosis not present

## 2021-04-24 DIAGNOSIS — M940 Chondrocostal junction syndrome [Tietze]: Secondary | ICD-10-CM | POA: Diagnosis not present

## 2021-04-24 DIAGNOSIS — J449 Chronic obstructive pulmonary disease, unspecified: Secondary | ICD-10-CM

## 2021-04-24 DIAGNOSIS — R042 Hemoptysis: Secondary | ICD-10-CM

## 2021-04-24 MED ORDER — BREZTRI AEROSPHERE 160-9-4.8 MCG/ACT IN AERO: 160.0000 ug | INHALATION_SPRAY | Freq: Every day | RESPIRATORY_TRACT | 1 refills | Status: AC

## 2021-04-24 MED ORDER — IBUPROFEN 600 MG PO TABS: 600.0000 mg | ORAL_TABLET | Freq: Four times a day (QID) | ORAL | 0 refills | Status: DC | PRN

## 2021-04-24 NOTE — Progress Notes (Signed)
I,Katawbba Wiggins,acting as a Education administrator for [employer], NP.,have documented all relevant documentation on the behalf of [employer], NP,as directed by  Bary Castilla, NP while in the presence of Bary Castilla, NP.  This visit occurred during the SARS-CoV-2 public health emergency.  Safety protocols were in place, including screening questions prior to the visit, additional usage of staff PPE, and extensive cleaning of exam room while observing appropriate contact time as indicated for disinfecting solutions.  Subjective:     Patient ID: Christopher Henry , male    DOB: [birth year] , 50 y.o.   MRN: [id number]   Chief Complaint  Patient presents with   Follow-up    HPI  The patient is here for a ED follow up as well get his FMLA forms filled for his work. He was recently in the Ed for COPD exac. The chest xray was clear but recommended to get CT of the chest due to his history of smoking, and blood in his sputum. The patient still has not followed up with the pulmonologist or the GI specialist for his swallowing issue. He has also lost weight recently.     Past Medical History:  Diagnosis Date   Anxiety state, unspecified 11/09/2012   Hypertension      Family History  Problem Relation Age of Onset   Hypertension Brother    Cancer Maternal Grandmother        unknown to pt     Current Outpatient Medications:    amLODipine (NORVASC) 5 MG tablet, Take 1 tablet (5 mg total) by mouth daily., Disp: 30 tablet, Rfl: 1   betamethasone dipropionate (DIPROLENE) 0.05 % ointment, Apply topically 2 (two) times daily., Disp: 30 g, Rfl: 0   Budeson-Glycopyrrol-Formoterol (BREZTRI AEROSPHERE) 160-9-4.8 MCG/ACT AERO, Inhale 160 mcg into the lungs daily., Disp: 1 g, Rfl: 1   doxycycline (VIBRAMYCIN) 100 MG capsule, Take 1 capsule (100 mg total) by mouth 2 (two) times daily., Disp: 20 capsule, Rfl: 0   ibuprofen (ADVIL) 600 MG tablet, Take 1 tablet (600 mg total) by mouth every 6 (six) hours  as needed., Disp: 30 tablet, Rfl: 0   No Known Allergies   Review of Systems  Constitutional: Negative.  Negative for chills and fatigue.  HENT:  Negative for congestion and nosebleeds.   Respiratory:  Positive for cough and wheezing.   Cardiovascular: Negative.  Negative for chest pain and palpitations.  Gastrointestinal: Negative.  Negative for diarrhea and nausea.  Neurological:  Negative for dizziness, tremors, weakness and numbness.  Psychiatric/Behavioral: Negative.    All other systems reviewed and are negative.   Today's Vitals   04/24/21 1407  BP: 132/84  Pulse: 86  Temp: 98.9 F (37.2 C)  TempSrc: Oral  Weight: 133 lb (60.3 kg)  Height: 6\' 1"  (1.854 m)   Body mass index is 17.55 kg/m.  Wt Readings from Last 3 Encounters:  04/24/21 133 lb (60.3 kg)  04/15/21 160 lb (72.6 kg)  03/27/21 139 lb (63 kg)    BP Readings from Last 3 Encounters:  04/24/21 132/84  04/15/21 (!) 156/96  03/27/21 (!) 142/70    Objective:  Physical Exam Constitutional:      Appearance: Normal appearance.  HENT:     Head: Normocephalic and atraumatic.  Cardiovascular:     Rate and Rhythm: Normal rate and regular rhythm.     Pulses: Normal pulses.     Heart sounds: Normal heart sounds. No murmur heard. Pulmonary:     Effort: Pulmonary effort is  normal. No respiratory distress.     Breath sounds: Normal breath sounds.  Chest:     Chest wall: Tenderness present.  Breasts:    Right: Tenderness present.     Left: Tenderness present.  Skin:    General: Skin is warm and dry.     Capillary Refill: Capillary refill takes less than 2 seconds.  Neurological:     Mental Status: He is alert.        Assessment And Plan:     1. Bronchitis with chronic wheezing - CT Chest Wo Contrast; Future -Pt has already been referred to pulmonology    2. Costochondritis - ibuprofen (ADVIL) 600 MG tablet; Take 1 tablet (600 mg total) by mouth every 6 (six) hours as needed.  Dispense: 30 tablet;  Refill: 0  3. Hemoptysis - CT Chest Wo Contrast; Future -Will check CT  -Advised patient to keep his appt with pulmonology   4. Chronic obstructive pulmonary disease, unspecified COPD type ([place]) - Budeson-Glycopyrrol-Formoterol (BREZTRI AEROSPHERE) 160-9-4.8 MCG/ACT AERO; Inhale 160 mcg into the lungs daily.  Dispense: 1 g; Refill: 1   Filled out FMLA forms for the patient.   Follow up: if symptoms persist or do not get better.   The patient was encouraged to call or send a message through [place] for any questions or concerns.   Side effects and appropriate use of all the medication(s) were discussed with the patient today. Patient advised to use the medication(s) as directed by their healthcare provider. The patient was encouraged to read, review, and understand all associated package inserts and contact our office with any questions or concerns. The patient accepts the risks of the treatment plan and had an opportunity to ask questions.   Patient was given opportunity to ask questions. Patient verbalized understanding of the plan and was able to repeat key elements of the plan. All questions were answered to their satisfaction.  Raman Iasha Mccalister, DNP   I, Raman Verlon Pischke have reviewed all documentation for this visit. The documentation on 04/24/21 for the exam, diagnosis, procedures, and orders are all accurate and complete.     Patient was given opportunity to ask questions. Patient verbalized understanding of the plan and was able to repeat key elements of the plan. All questions were answered to their satisfaction.  Bary Castilla, NP   I, Bary Castilla, NP, have reviewed all documentation for this visit. The documentation on 04/24/21 for the exam, diagnosis, procedures, and orders are all accurate and complete.   IF YOU HAVE BEEN REFERRED TO A SPECIALIST, IT MAY TAKE 1-2 WEEKS TO SCHEDULE/PROCESS THE REFERRAL. IF YOU HAVE NOT HEARD FROM US/SPECIALIST IN TWO WEEKS, PLEASE GIVE [place]  A CALL AT [phone] X [phone].   THE PATIENT IS ENCOURAGED TO PRACTICE SOCIAL DISTANCING DUE TO THE COVID-19 PANDEMIC.

## 2021-04-25 ENCOUNTER — Telehealth: Payer: Self-pay

## 2021-04-25 NOTE — Telephone Encounter (Signed)
I left the pt a message that his medical documentation form is ready for pickup, the last page needs his signature before the office can fax it, Ramen, DNP, FNP-BC said for the pt to make sure that he goes to have his ct done and to see his pulmonologist.

## 2021-04-29 ENCOUNTER — Encounter: Payer: Self-pay | Admitting: Gastroenterology

## 2021-04-29 ENCOUNTER — Telehealth: Payer: Self-pay | Admitting: Gastroenterology

## 2021-05-01 ENCOUNTER — Other Ambulatory Visit: Payer: Self-pay | Admitting: Nurse Practitioner

## 2021-05-01 ENCOUNTER — Encounter: Payer: 59 | Admitting: Nurse Practitioner

## 2021-05-14 ENCOUNTER — Encounter: Payer: 59 | Admitting: Nurse Practitioner

## 2021-05-16 ENCOUNTER — Other Ambulatory Visit: Payer: Self-pay

## 2021-05-16 ENCOUNTER — Encounter: Payer: Self-pay | Admitting: Pulmonary Disease

## 2021-05-16 ENCOUNTER — Inpatient Hospital Stay (HOSPITAL_COMMUNITY)
Admission: EM | Admit: 2021-05-16 | Discharge: 2021-07-03 | DRG: 853 | Disposition: E | Payer: 59 | Source: Ambulatory Visit | Attending: Family Medicine | Admitting: Family Medicine

## 2021-05-16 ENCOUNTER — Emergency Department (HOSPITAL_COMMUNITY): Payer: 59

## 2021-05-16 ENCOUNTER — Ambulatory Visit: Payer: 59 | Admitting: Pulmonary Disease

## 2021-05-16 VITALS — BP 114/64 | HR 54 | Ht 76.0 in | Wt 131.0 lb

## 2021-05-16 DIAGNOSIS — E43 Unspecified severe protein-calorie malnutrition: Secondary | ICD-10-CM | POA: Diagnosis present

## 2021-05-16 DIAGNOSIS — D649 Anemia, unspecified: Secondary | ICD-10-CM | POA: Diagnosis not present

## 2021-05-16 DIAGNOSIS — K635 Polyp of colon: Secondary | ICD-10-CM | POA: Diagnosis present

## 2021-05-16 DIAGNOSIS — Z720 Tobacco use: Secondary | ICD-10-CM | POA: Diagnosis not present

## 2021-05-16 DIAGNOSIS — Z87898 Personal history of other specified conditions: Secondary | ICD-10-CM | POA: Diagnosis not present

## 2021-05-16 DIAGNOSIS — I10 Essential (primary) hypertension: Secondary | ICD-10-CM | POA: Diagnosis present

## 2021-05-16 DIAGNOSIS — D75839 Thrombocytosis, unspecified: Secondary | ICD-10-CM | POA: Diagnosis not present

## 2021-05-16 DIAGNOSIS — J441 Chronic obstructive pulmonary disease with (acute) exacerbation: Secondary | ICD-10-CM | POA: Insufficient documentation

## 2021-05-16 DIAGNOSIS — J189 Pneumonia, unspecified organism: Secondary | ICD-10-CM | POA: Diagnosis present

## 2021-05-16 DIAGNOSIS — G893 Neoplasm related pain (acute) (chronic): Secondary | ICD-10-CM | POA: Diagnosis not present

## 2021-05-16 DIAGNOSIS — E8809 Other disorders of plasma-protein metabolism, not elsewhere classified: Secondary | ICD-10-CM | POA: Diagnosis not present

## 2021-05-16 DIAGNOSIS — K2100 Gastro-esophageal reflux disease with esophagitis, without bleeding: Secondary | ICD-10-CM | POA: Diagnosis not present

## 2021-05-16 DIAGNOSIS — Z809 Family history of malignant neoplasm, unspecified: Secondary | ICD-10-CM | POA: Diagnosis not present

## 2021-05-16 DIAGNOSIS — R64 Cachexia: Secondary | ICD-10-CM | POA: Diagnosis present

## 2021-05-16 DIAGNOSIS — D62 Acute posthemorrhagic anemia: Secondary | ICD-10-CM | POA: Diagnosis present

## 2021-05-16 DIAGNOSIS — J9811 Atelectasis: Secondary | ICD-10-CM | POA: Diagnosis present

## 2021-05-16 DIAGNOSIS — R0602 Shortness of breath: Secondary | ICD-10-CM | POA: Diagnosis not present

## 2021-05-16 DIAGNOSIS — E875 Hyperkalemia: Secondary | ICD-10-CM | POA: Diagnosis not present

## 2021-05-16 DIAGNOSIS — I319 Disease of pericardium, unspecified: Secondary | ICD-10-CM | POA: Diagnosis not present

## 2021-05-16 DIAGNOSIS — R627 Adult failure to thrive: Secondary | ICD-10-CM | POA: Diagnosis present

## 2021-05-16 DIAGNOSIS — J9859 Other diseases of mediastinum, not elsewhere classified: Secondary | ICD-10-CM

## 2021-05-16 DIAGNOSIS — C3491 Malignant neoplasm of unspecified part of right bronchus or lung: Secondary | ICD-10-CM | POA: Diagnosis not present

## 2021-05-16 DIAGNOSIS — E441 Mild protein-calorie malnutrition: Secondary | ICD-10-CM | POA: Diagnosis not present

## 2021-05-16 DIAGNOSIS — R1900 Intra-abdominal and pelvic swelling, mass and lump, unspecified site: Secondary | ICD-10-CM | POA: Diagnosis present

## 2021-05-16 DIAGNOSIS — K625 Hemorrhage of anus and rectum: Secondary | ICD-10-CM | POA: Diagnosis not present

## 2021-05-16 DIAGNOSIS — R0902 Hypoxemia: Secondary | ICD-10-CM

## 2021-05-16 DIAGNOSIS — R54 Age-related physical debility: Secondary | ICD-10-CM | POA: Diagnosis present

## 2021-05-16 DIAGNOSIS — D72829 Elevated white blood cell count, unspecified: Secondary | ICD-10-CM | POA: Diagnosis not present

## 2021-05-16 DIAGNOSIS — A403 Sepsis due to Streptococcus pneumoniae: Secondary | ICD-10-CM | POA: Diagnosis not present

## 2021-05-16 DIAGNOSIS — Z7289 Other problems related to lifestyle: Secondary | ICD-10-CM | POA: Diagnosis not present

## 2021-05-16 DIAGNOSIS — Z87891 Personal history of nicotine dependence: Secondary | ICD-10-CM

## 2021-05-16 DIAGNOSIS — Z66 Do not resuscitate: Secondary | ICD-10-CM | POA: Diagnosis not present

## 2021-05-16 DIAGNOSIS — K921 Melena: Secondary | ICD-10-CM | POA: Diagnosis present

## 2021-05-16 DIAGNOSIS — Z4682 Encounter for fitting and adjustment of non-vascular catheter: Secondary | ICD-10-CM

## 2021-05-16 DIAGNOSIS — A419 Sepsis, unspecified organism: Principal | ICD-10-CM | POA: Diagnosis present

## 2021-05-16 DIAGNOSIS — J9621 Acute and chronic respiratory failure with hypoxia: Secondary | ICD-10-CM | POA: Diagnosis present

## 2021-05-16 DIAGNOSIS — E871 Hypo-osmolality and hyponatremia: Secondary | ICD-10-CM | POA: Diagnosis present

## 2021-05-16 DIAGNOSIS — R000 Tachycardia, unspecified: Secondary | ICD-10-CM | POA: Diagnosis not present

## 2021-05-16 DIAGNOSIS — N179 Acute kidney failure, unspecified: Secondary | ICD-10-CM | POA: Diagnosis present

## 2021-05-16 DIAGNOSIS — R918 Other nonspecific abnormal finding of lung field: Secondary | ICD-10-CM

## 2021-05-16 DIAGNOSIS — Z7951 Long term (current) use of inhaled steroids: Secondary | ICD-10-CM

## 2021-05-16 DIAGNOSIS — J90 Pleural effusion, not elsewhere classified: Secondary | ICD-10-CM | POA: Diagnosis not present

## 2021-05-16 DIAGNOSIS — E790 Hyperuricemia without signs of inflammatory arthritis and tophaceous disease: Secondary | ICD-10-CM | POA: Diagnosis not present

## 2021-05-16 DIAGNOSIS — Z20822 Contact with and (suspected) exposure to covid-19: Secondary | ICD-10-CM | POA: Diagnosis present

## 2021-05-16 DIAGNOSIS — J9601 Acute respiratory failure with hypoxia: Secondary | ICD-10-CM | POA: Diagnosis present

## 2021-05-16 DIAGNOSIS — R451 Restlessness and agitation: Secondary | ICD-10-CM | POA: Diagnosis not present

## 2021-05-16 DIAGNOSIS — C7989 Secondary malignant neoplasm of other specified sites: Secondary | ICD-10-CM | POA: Diagnosis present

## 2021-05-16 DIAGNOSIS — D630 Anemia in neoplastic disease: Secondary | ICD-10-CM | POA: Diagnosis present

## 2021-05-16 DIAGNOSIS — K922 Gastrointestinal hemorrhage, unspecified: Secondary | ICD-10-CM

## 2021-05-16 DIAGNOSIS — I3139 Other pericardial effusion (noninflammatory): Secondary | ICD-10-CM | POA: Diagnosis not present

## 2021-05-16 DIAGNOSIS — E872 Acidosis, unspecified: Secondary | ICD-10-CM | POA: Diagnosis present

## 2021-05-16 DIAGNOSIS — I3131 Malignant pericardial effusion in diseases classified elsewhere: Secondary | ICD-10-CM | POA: Diagnosis present

## 2021-05-16 DIAGNOSIS — Z8249 Family history of ischemic heart disease and other diseases of the circulatory system: Secondary | ICD-10-CM

## 2021-05-16 DIAGNOSIS — K6289 Other specified diseases of anus and rectum: Secondary | ICD-10-CM

## 2021-05-16 DIAGNOSIS — Z515 Encounter for palliative care: Secondary | ICD-10-CM | POA: Diagnosis not present

## 2021-05-16 DIAGNOSIS — R042 Hemoptysis: Secondary | ICD-10-CM | POA: Diagnosis present

## 2021-05-16 DIAGNOSIS — J439 Emphysema, unspecified: Secondary | ICD-10-CM | POA: Diagnosis present

## 2021-05-16 DIAGNOSIS — F1090 Alcohol use, unspecified, uncomplicated: Secondary | ICD-10-CM | POA: Diagnosis not present

## 2021-05-16 DIAGNOSIS — F1010 Alcohol abuse, uncomplicated: Secondary | ICD-10-CM | POA: Diagnosis present

## 2021-05-16 DIAGNOSIS — Q256 Stenosis of pulmonary artery: Secondary | ICD-10-CM | POA: Diagnosis not present

## 2021-05-16 DIAGNOSIS — Z7189 Other specified counseling: Secondary | ICD-10-CM | POA: Diagnosis not present

## 2021-05-16 DIAGNOSIS — C383 Malignant neoplasm of mediastinum, part unspecified: Secondary | ICD-10-CM | POA: Diagnosis present

## 2021-05-16 DIAGNOSIS — F411 Generalized anxiety disorder: Secondary | ICD-10-CM | POA: Diagnosis present

## 2021-05-16 DIAGNOSIS — F419 Anxiety disorder, unspecified: Secondary | ICD-10-CM | POA: Diagnosis not present

## 2021-05-16 DIAGNOSIS — Z79899 Other long term (current) drug therapy: Secondary | ICD-10-CM

## 2021-05-16 DIAGNOSIS — J449 Chronic obstructive pulmonary disease, unspecified: Secondary | ICD-10-CM

## 2021-05-16 DIAGNOSIS — J910 Malignant pleural effusion: Secondary | ICD-10-CM | POA: Diagnosis not present

## 2021-05-16 DIAGNOSIS — R443 Hallucinations, unspecified: Secondary | ICD-10-CM | POA: Diagnosis not present

## 2021-05-16 DIAGNOSIS — E860 Dehydration: Secondary | ICD-10-CM | POA: Diagnosis not present

## 2021-05-16 DIAGNOSIS — G4700 Insomnia, unspecified: Secondary | ICD-10-CM | POA: Diagnosis not present

## 2021-05-16 DIAGNOSIS — C3490 Malignant neoplasm of unspecified part of unspecified bronchus or lung: Secondary | ICD-10-CM | POA: Diagnosis not present

## 2021-05-16 DIAGNOSIS — Z681 Body mass index (BMI) 19 or less, adult: Secondary | ICD-10-CM

## 2021-05-16 DIAGNOSIS — R001 Bradycardia, unspecified: Secondary | ICD-10-CM | POA: Diagnosis present

## 2021-05-16 DIAGNOSIS — F17210 Nicotine dependence, cigarettes, uncomplicated: Secondary | ICD-10-CM | POA: Diagnosis not present

## 2021-05-16 DIAGNOSIS — I213 ST elevation (STEMI) myocardial infarction of unspecified site: Secondary | ICD-10-CM | POA: Diagnosis not present

## 2021-05-16 DIAGNOSIS — B3781 Candidal esophagitis: Secondary | ICD-10-CM | POA: Diagnosis present

## 2021-05-16 DIAGNOSIS — K920 Hematemesis: Secondary | ICD-10-CM | POA: Diagnosis present

## 2021-05-16 DIAGNOSIS — C3431 Malignant neoplasm of lower lobe, right bronchus or lung: Secondary | ICD-10-CM | POA: Diagnosis not present

## 2021-05-16 DIAGNOSIS — K643 Fourth degree hemorrhoids: Secondary | ICD-10-CM | POA: Diagnosis present

## 2021-05-16 LAB — COMPREHENSIVE METABOLIC PANEL
ALT: 8 U/L (ref 0–44)
AST: 15 U/L (ref 15–41)
Albumin: 3.3 g/dL — ABNORMAL LOW (ref 3.5–5.0)
Alkaline Phosphatase: 61 U/L (ref 38–126)
Anion gap: 14 (ref 5–15)
BUN: 31 mg/dL — ABNORMAL HIGH (ref 6–20)
CO2: 19 mmol/L — ABNORMAL LOW (ref 22–32)
Calcium: 8.8 mg/dL — ABNORMAL LOW (ref 8.9–10.3)
Chloride: 101 mmol/L (ref 98–111)
Creatinine, Ser: 1.58 mg/dL — ABNORMAL HIGH (ref 0.61–1.24)
GFR, Estimated: 53 mL/min — ABNORMAL LOW (ref >60)
Glucose, Bld: 111 mg/dL — ABNORMAL HIGH (ref 70–99)
Potassium: 4.2 mmol/L (ref 3.5–5.1)
Sodium: 134 mmol/L — ABNORMAL LOW (ref 135–145)
Total Bilirubin: 0.5 mg/dL (ref 0.3–1.2)
Total Protein: 7.6 g/dL (ref 6.5–8.1)

## 2021-05-16 LAB — CBC WITH DIFFERENTIAL/PLATELET
Abs Immature Granulocytes: 0.37 10*3/uL — ABNORMAL HIGH (ref 0.00–0.07)
Basophils Absolute: 0.1 10*3/uL (ref 0.0–0.1)
Basophils Relative: 0 %
Eosinophils Absolute: 0.1 10*3/uL (ref 0.0–0.5)
Eosinophils Relative: 0 %
HCT: 21.6 % — ABNORMAL LOW (ref 39.0–52.0)
Hemoglobin: 6.9 g/dL — CL (ref 13.0–17.0)
Immature Granulocytes: 1 %
Lymphocytes Relative: 4 %
Lymphs Abs: 1.2 10*3/uL (ref 0.7–4.0)
MCH: 25.3 pg — ABNORMAL LOW (ref 26.0–34.0)
MCHC: 31.9 g/dL (ref 30.0–36.0)
MCV: 79.1 fL — ABNORMAL LOW (ref 80.0–100.0)
Monocytes Absolute: 2.2 10*3/uL — ABNORMAL HIGH (ref 0.1–1.0)
Monocytes Relative: 7 %
Neutro Abs: 26.8 10*3/uL — ABNORMAL HIGH (ref 1.7–7.7)
Neutrophils Relative %: 88 %
Platelets: 534 10*3/uL — ABNORMAL HIGH (ref 150–400)
RBC: 2.73 MIL/uL — ABNORMAL LOW (ref 4.22–5.81)
RDW: 20 % — ABNORMAL HIGH (ref 11.5–15.5)
WBC: 30.8 10*3/uL — ABNORMAL HIGH (ref 4.0–10.5)
nRBC: 0 % (ref 0.0–0.2)

## 2021-05-16 LAB — BASIC METABOLIC PANEL
Anion gap: 12 (ref 5–15)
BUN: 29 mg/dL — ABNORMAL HIGH (ref 6–20)
CO2: 20 mmol/L — ABNORMAL LOW (ref 22–32)
Calcium: 9 mg/dL (ref 8.9–10.3)
Chloride: 101 mmol/L (ref 98–111)
Creatinine, Ser: 1.6 mg/dL — ABNORMAL HIGH (ref 0.61–1.24)
GFR, Estimated: 52 mL/min — ABNORMAL LOW (ref >60)
Glucose, Bld: 147 mg/dL — ABNORMAL HIGH (ref 70–99)
Potassium: 3.9 mmol/L (ref 3.5–5.1)
Sodium: 133 mmol/L — ABNORMAL LOW (ref 135–145)

## 2021-05-16 LAB — RESP PANEL BY RT-PCR (FLU A&B, COVID) ARPGX2
Influenza A by PCR: NEGATIVE
Influenza B by PCR: NEGATIVE
SARS Coronavirus 2 by RT PCR: NEGATIVE

## 2021-05-16 LAB — AMMONIA: Ammonia: <10 umol/L (ref 9–35)

## 2021-05-16 LAB — PROCALCITONIN: Procalcitonin: 0.19 ng/mL

## 2021-05-16 LAB — LIPASE, BLOOD: Lipase: 29 U/L (ref 11–51)

## 2021-05-16 LAB — PROTIME-INR
INR: 1.4 — ABNORMAL HIGH (ref 0.8–1.2)
Prothrombin Time: 16.9 seconds — ABNORMAL HIGH (ref 11.4–15.2)

## 2021-05-16 LAB — ABO/RH: ABO/RH(D): B POS

## 2021-05-16 LAB — TROPONIN I (HIGH SENSITIVITY)
Troponin I (High Sensitivity): 49 ng/L — ABNORMAL HIGH (ref <18)
Troponin I (High Sensitivity): 8 ng/L (ref <18)

## 2021-05-16 LAB — HEMOGLOBIN AND HEMATOCRIT, BLOOD
HCT: 22.1 % — ABNORMAL LOW (ref 39.0–52.0)
Hemoglobin: 7.2 g/dL — ABNORMAL LOW (ref 13.0–17.0)

## 2021-05-16 LAB — BRAIN NATRIURETIC PEPTIDE: B Natriuretic Peptide: 68.6 pg/mL (ref 0.0–100.0)

## 2021-05-16 LAB — D-DIMER, QUANTITATIVE: D-Dimer, Quant: 3.52 ug/mL-FEU — ABNORMAL HIGH (ref 0.00–0.50)

## 2021-05-16 LAB — PREPARE RBC (CROSSMATCH)

## 2021-05-16 LAB — APTT: aPTT: 37 seconds — ABNORMAL HIGH (ref 24–36)

## 2021-05-16 LAB — LACTIC ACID, PLASMA
Lactic Acid, Venous: 2.4 mmol/L (ref 0.5–1.9)
Lactic Acid, Venous: 2.7 mmol/L (ref 0.5–1.9)

## 2021-05-16 MED ORDER — ALBUTEROL SULFATE (2.5 MG/3ML) 0.083% IN NEBU: 2.5000 mg | INHALATION_SOLUTION | RESPIRATORY_TRACT | Status: DC | PRN

## 2021-05-16 MED ORDER — LACTATED RINGERS IV BOLUS
2000.0000 mL | Freq: Once | INTRAVENOUS | Status: AC
  Administered 2021-05-16: 2000 mL via INTRAVENOUS

## 2021-05-16 MED ORDER — LORAZEPAM 1 MG PO TABS
1.0000 mg | ORAL_TABLET | Freq: Once | ORAL | Status: AC
  Administered 2021-05-16: 1 mg via ORAL
  Filled 2021-05-16: qty 1

## 2021-05-16 MED ORDER — SODIUM CHLORIDE 0.9 % IV SOLN
1.0000 g | INTRAVENOUS | Status: AC
  Administered 2021-05-16 – 2021-05-25 (×10): 1 g via INTRAVENOUS
  Filled 2021-05-16 (×10): qty 10

## 2021-05-16 MED ORDER — SODIUM CHLORIDE 0.9 % IV SOLN
500.0000 mg | INTRAVENOUS | Status: AC
  Administered 2021-05-16 – 2021-05-20 (×5): 500 mg via INTRAVENOUS
  Filled 2021-05-16 (×6): qty 500

## 2021-05-16 MED ORDER — AMLODIPINE BESYLATE 5 MG PO TABS: 5.0000 mg | ORAL_TABLET | Freq: Every day | ORAL | Status: DC

## 2021-05-16 MED ORDER — LACTATED RINGERS IV SOLN: INTRAVENOUS | Status: DC

## 2021-05-16 MED ORDER — UMECLIDINIUM BROMIDE 62.5 MCG/INH IN AEPB
1.0000 | INHALATION_SPRAY | Freq: Every day | RESPIRATORY_TRACT | Status: DC
  Administered 2021-05-18 – 2021-06-06 (×16): 1 via RESPIRATORY_TRACT
  Filled 2021-05-16 (×3): qty 7

## 2021-05-16 MED ORDER — BUDESON-GLYCOPYRROL-FORMOTEROL 160-9-4.8 MCG/ACT IN AERO: 160.0000 ug | INHALATION_SPRAY | Freq: Two times a day (BID) | RESPIRATORY_TRACT | Status: DC

## 2021-05-16 MED ORDER — SODIUM CHLORIDE 0.9 % IV SOLN
10.0000 mL/h | Freq: Once | INTRAVENOUS | Status: DC
Start: 2021-05-16 — End: 2021-06-07

## 2021-05-16 MED ORDER — MOMETASONE FURO-FORMOTEROL FUM 200-5 MCG/ACT IN AERO
2.0000 | INHALATION_SPRAY | Freq: Two times a day (BID) | RESPIRATORY_TRACT | Status: DC
  Administered 2021-05-16 – 2021-06-06 (×37): 2 via RESPIRATORY_TRACT
  Filled 2021-05-16 (×2): qty 8.8

## 2021-05-16 MED ORDER — IOHEXOL 350 MG/ML SOLN
65.0000 mL | Freq: Once | INTRAVENOUS | Status: AC | PRN
  Administered 2021-05-16: 65 mL via INTRAVENOUS

## 2021-05-16 MED ORDER — SODIUM CHLORIDE 0.9 % IV SOLN
2.0000 g | Freq: Once | INTRAVENOUS | Status: AC
  Administered 2021-05-16: 2 g via INTRAVENOUS
  Filled 2021-05-16: qty 2

## 2021-05-16 NOTE — ED Notes (Signed)
Pt states he drinks approx pint a day and approx 6 tall boys of beer

## 2021-05-16 NOTE — ED Triage Notes (Signed)
Pt here via EMS with c/o SOB with exertion. History of chronic bronchitis. Pt at PCP with SpO2 of 80. Placed on 2 liters with improvement of 100%. Chest tingling. Pt reports rectal bleeding X1 week.  118/75 134 HR 18 RR 100% 3 liters [place]

## 2021-05-16 NOTE — Consult Note (Addendum)
Name: Christopher Henry MRN: [id number] DOB: 25-Apr-1971    ADMISSION DATE:  05/09/2021 CONSULTATION DATE:  05/14/2021   REFERRING MD :  Tegeler, EDP   CHIEF COMPLAINT: Hemoptysis, bright red blood per rectum   HISTORY OF PRESENT ILLNESS: 50 year old heavy smoker with COPD, seen by my partner Dr. Loanne Drilling in the pulmonary office and sent to the emergency room.  He reports hemoptysis for the past 6 days, increase shortness of breath and wheezing, unable to ambulate short distances.  He was noted to be bradycardic in the 50s in the office with saturation 80% on room air, placed on 3 L nasal cannula and referred to the emergency room.  CT angiogram chest showed no PE but showed mediastinal mass infiltrating into the carina with mediastinal lymphadenopathy including a 2.7 cm right paratracheal lymph node.  There was stenosis of the right main pulmonary artery there was a 1.7 cm groundglass nodule at the right apex and there was complete collapse of the right lower lobe.  There was a small volume pericardial effusion On further questioning, he reports shortness of breath ongoing for 3 months, 50 pound weight loss over the past 2 years. He also reported bright red blood per rectum, painless and sometimes without his knowledge which is not characteristic of his known hemorrhoids   Labs in the ED showed mild hyponatremia, leukocytosis 30 1K, hemoglobin of 6.9   PAST MEDICAL HISTORY :  Past Medical History:  Diagnosis Date   Anxiety state, unspecified 11/09/2012   Hypertension    Past Surgical History:  Procedure Laterality Date   FINGER SURGERY     HIP SURGERY     Prior to Admission medications   Medication Sig Start Date End Date Taking? Authorizing Provider  amLODipine (NORVASC) 5 MG tablet Take 1 tablet (5 mg total) by mouth daily. 03/27/21  Yes Ghumman, Ramandeep, NP  betamethasone dipropionate (DIPROLENE) 0.05 % ointment Apply topically 2 (two) times daily. 03/27/21  Yes Ghumman, Ramandeep, NP   Budeson-Glycopyrrol-Formoterol (BREZTRI AEROSPHERE) 160-9-4.8 MCG/ACT AERO Inhale 160 mcg into the lungs daily. Patient taking differently: Inhale 160 mcg into the lungs in the morning and at bedtime. 04/24/21  Yes Ghumman, Ramandeep, NP  IBUPROFEN PO Take 1 tablet by mouth daily as needed (headache, pain).   Yes [provider]   No Known Allergies  FAMILY HISTORY:  Family History  Problem Relation Age of Onset   Hypertension Brother    Cancer Maternal Grandmother        unknown to pt   SOCIAL HISTORY:  reports that he quit smoking 4 days ago. His smoking use included cigarettes. He has a 70.00 pack-year smoking history. He has never used smokeless tobacco. He reports current alcohol use. He reports that he does not use drugs.  REVIEW OF SYSTEMS:   Bloodstained sputum for the past week Shortness of breath and wheezing Part of blood per rectum Weight loss as above   Constitutional: Negative for fever, chills, malaise/fatigue and diaphoresis.  HENT: Negative for hearing loss, ear pain, nosebleeds, congestion, sore throat, neck pain, tinnitus and ear discharge.   Eyes: Negative for blurred vision, double vision, photophobia, pain, discharge and redness.    Cardiovascular: Negative for chest pain, palpitations, orthopnea, claudication, leg swelling and PND.  Gastrointestinal: Negative for heartburn, nausea, vomiting, abdominal pain, diarrhea, constipation, melena.  Genitourinary: Negative for dysuria, urgency, frequency, hematuria and flank pain.  Musculoskeletal: Negative for myalgias, back pain, joint pain and falls.  Skin: Negative for itching and rash.  Neurological: Negative for dizziness, tingling, tremors, sensory change, speech change, focal weakness, seizures, loss of consciousness, weakness and headaches.  Endo/Heme/Allergies: Negative for environmental allergies and polydipsia. Does not bruise/bleed easily.  SUBJECTIVE:   VITAL SIGNS: Temp:  [98 F (36.7  C)-99.5 F (37.5 C)] 98.5 F (36.9 C) (10/14 1637) Pulse Rate:  [54-134] 63 (10/14 1745) Resp:  [19-29] 29 (10/14 1745) BP: (92-152)/(64-97) 152/97 (10/14 1745) SpO2:  [80 %-100 %] 100 % (10/14 1745) Weight:  [59.4 kg] 59.4 kg (10/14 1136)  PHYSICAL EXAMINATION: Gen. thin man, in no distress, normal affect ENT - no lesions, no post nasal drip, pallor +, no icterus Neck: No JVD, no thyromegaly, no carotid bruits Lungs: no use of accessory muscles, no dullness to percussion, decreased breath sounds on right, no rhonchi Cardiovascular: Rhythm regular, heart sounds  normal, no murmurs, no peripheral edema Abdomen: soft and non-tender, no hepatosplenomegaly, BS normal. Musculoskeletal: No deformities, no cyanosis , clubbing2+ Neuro:  alert, non focal Skin:  Warm, no lesions/ rash   Recent Labs  Lab 05/10/2021 1341 05/24/2021 1604  NA 133* 134*  K 3.9 4.2  CL 101 101  CO2 20* 19*  BUN 29* 31*  CREATININE 1.60* 1.58*  GLUCOSE 147* 111*   Recent Labs  Lab 05/24/2021 1341  HGB 6.9*  HCT 21.6*  WBC 30.8*  PLT 534*   DG Chest 2 View  Result Date: 05/15/2021 CLINICAL DATA:  Shortness of breath EXAM: CHEST - 2 VIEW COMPARISON:  Radiograph 04/15/2021 FINDINGS: Unchanged cardiomediastinal silhouette. New right lower lobe airspace disease. No large pleural effusion or visible pneumothorax. There is prominence of the azygous vein and right paratracheal stripe. There is an unchanged compression deformity in midthoracic spine. No acute osseous abnormality. IMPRESSION: Right lower lobe pneumonia. Recommend follow-up chest radiograph in 6-12 weeks to ensure resolution. Electronically Signed   By: Maurine Simmering M.D.   On: 05/03/2021 14:21   CT Angio Chest PE W and/or Wo Contrast  Result Date: 05/05/2021 CLINICAL DATA:  Pt here via EMS with c/o SOB with exertion. History of chronic bronchitis. Pt at PCP with SpO2 of 80. Placed on 2 liters with improvement of 100%. Chest tingling. Pt reports rectal  bleeding X1 week EXAM: CT ANGIOGRAPHY CHEST WITH CONTRAST TECHNIQUE: Multidetector CT imaging of the chest was performed using the standard protocol during bolus administration of intravenous contrast. Multiplanar CT image reconstructions and MIPs were obtained to evaluate the vascular anatomy. CONTRAST:  72mL OMNIPAQUE IOHEXOL 350 MG/ML SOLN COMPARISON:  Chest x-ray 05/07/2021, chest x-ray 04/15/2021 FINDINGS: Cardiovascular: Satisfactory opacification of the pulmonary arteries to the segmental level. No evidence of pulmonary embolism. The main pulmonary artery is normal in caliber. There is narrowing but no definite significant stenosis of the right main pulmonary artery due to external mass effect. Normal heart size. Small to moderate volume pericardial effusion. Left-to-right midline shift. Mediastinum/Nodes: Infiltrative mass of the mediastinum with markedly limited evaluation of its borders due to timing of contrast: Measures up to at least 5.8 cm on axial imaging (10:229). Mediastinal lymphadenopathy with as an example a 2.7 cm right paratracheal lymph node (2:58). No definite left hilar lymphadenopathy. No axillary nodes. Question extension of the mass into the carina (11:61). Thyroid gland demonstrate no significant findings. The esophagus is not well visualized due to abutment to the mass. Lungs/Pleura: Complete collapse of the right lower lobe with associated endobronchial debris/lesion. A 1.7 cm ground-glass airspace opacity at the right apex. Associated vague peripheral ground-glass airspace opacities within the right  upper lobe. At least mild to moderate paraseptal and centrilobular emphysematous changes. No focal consolidation, pulmonary nodule, mass of the left lung. No left pleural effusion. No right pleural effusion. No pneumothorax. Upper Abdomen: No acute abnormality. Musculoskeletal: Minimally displaced subacute to chronic fracture of the mid sternal body. Associated underlying cirrhosis may  represent pathologic fracture versus healing. Otherwise no suspicious lytic or blastic osseous lesions. No acute displaced fracture. Multilevel degenerative changes of the spine. Slight vertebral body height loss anteriorly of the T9 vertebral body. Review of the MIP images confirms the above findings. IMPRESSION: 1. No pulmonary embolus; however, mild narrowing with no significant stenosis of the right main pulmonary artery due to external mass effect from the mass described below. 2. Infiltrative mass of the mediastinum with markedly limited evaluation of its borders due to timing of contrast. Associated complete collapse of the right lower lobe with associated complete right lower lobe bronchi occlusion due to endobronchial debris/lesion. Question extension of the mass into the carina.Associated left-to-right midline shift due to decreased lung volume. Concern for underlying malignancy - likely of the mediastinum with extent into or around the right mainstem bronchi leading to collapse the right lower lobe. Additional imaging evaluation or consultation with Pulmonology or Thoracic Surgery recommended. 3. Mediastinal lymphadenopathy. 4. A 1.7 cm ground-glass airspace opacity at the right apex. Associated vague peripheral ground-glass airspace opacities within the right upper lobe. 5. Small to moderate volume pericardial effusion. 6. Subacute to chronic fracture of the mid sternal body. Underlying sclerosis likely due to healing; however, pathologic fracture is not fully excluded. 7. Emphysema (ICD10-J43.9). Electronically Signed   By: Iven Finn M.D.   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  I am concerned about carinal  extension which is likely causing his incessant coughing and hemoptysis in which has the potential to cause complete airway obstruction.  He will need radiation oncology to be involved urgently and an urgent diagnosis but may need to be medically optimized before bronchoscopy  -Please keep him n.p.o. tonight, we will reassess him in the morning and schedule bronchoscopy -Obtain echocardiogram  Postobstructive/right lower lobe pneumonia -Treat with empiric antibiotics  Acute/chronic hypoxic respiratory failure -oxygen as needed -Bronchodilators for COPD/emphysema  Bleeding per rectum -May need GI evaluation, transfusion is planned   Kara Mead MD. [facility]. [facility] Pager [phone] If no response call [phone]    05/09/2021, 6:34 PM

## 2021-05-16 NOTE — H&P (Signed)
History and Physical    Christopher Henry YBO:[id number] DOB: 14-Jul-1971 DOA: 05/23/2021  PCP: Bary Castilla, NP Consultants:  pulmonology: Dr Loanne Drilling Patient coming from: pulm clinic. Lives at home with his girlfriend   Chief Complaint: worsening shortness of breath, hypoxia   HPI: Christopher Henry is a 50 y.o. male with medical history significant of HTN, anxiety who was being seen at his pulmonologist office today and was unable to ambulate short distances and could barely walk to the clinic room due to shortness of breath. Found to be hypoxic to 80% in respiratory distress. Placed on 3L [place] with improvement to 94%. EMS was called and he was transported to hospital. He has never had an oxygen requirement in the past.   He states he was on vacation last week in [place] and started to have light headedness and dizziness. On Sunday he started to get short of breath with progressive weakness and fatigue starting on Tuesday that has continued to get worse. He has coughing with exertion that feels like a spasm in his chest. Cough is dry in nature. He also has a burning sensation across his chest. He has coughed up blood a few times over the past 2 months.   He states he has had hot flashes and a 50 pound+ weight loss over the last year or more. He has a smoking history of 1PPD x 35 years and recently quit a few days ago.   He also has complaints of BRBPR that started last Wednesday. It is mainly at night and he has painless bleeding that will soak through his pants. No pain or itching. Does have hx of hemorrhoids. NO family hx of colon cancer that he is aware of and he has never had a colonoscopy.   ED Course: vitals: Afebrile, blood pressure 108/76, heart rate 134, respiratory rate 23, oxygen 99% on 2L [place]. Pertinent labs: Hemoglobin 6.9, WBC 30.8, D-dimer 3.52, initial troponin 49, lactic acid 2.4, BUN 31, creatinine 1.58 Chest x-ray shows right lower lobe pneumonia. CTA: No PE.  Filtrated mass  of the mediastinum with markedly limited evaluation of its borders due to timing of contrast.  Measures up to at least 5.8 cm question extension of the mass into the carina.  Complete collapse of the right lower lobe with associated endobronchial debris/lesion.  Associated left to right midline shift due to decreased lung volume.  Concern for malignancy. In ED patient given cefepime and 2 L bolus of LR.  1 unit of blood given TRH was asked to admit.  Review of Systems: As per HPI; otherwise review of systems reviewed and negative.   Ambulatory Status:  Ambulates without assistance   Past Medical History:  Diagnosis Date   Anxiety state, unspecified 11/09/2012   Hypertension     Past Surgical History:  Procedure Laterality Date   FINGER SURGERY     HIP SURGERY      Social History   Socioeconomic History   Marital status: Soil scientist    Spouse name: Not on file   Number of children: Not on file   Years of education: Not on file   Highest education level: Not on file  Occupational History   Not on file  Tobacco Use   Smoking status: Former    Packs/day: 2.00    Years: 35.00    Pack years: 70.00    Types: Cigarettes    Quit date: 05/12/2021    Years since quitting: 0.0   Smokeless tobacco: Never  Substance and Sexual Activity   Alcohol use: Yes    Comment: 1 pint a day   Drug use: No   Sexual activity: Not on file  Other Topics Concern   Not on file  Social History Narrative   Not on file   Social Determinants of Health   Financial Resource Strain: Not on file  Food Insecurity: Not on file  Transportation Needs: Not on file  Physical Activity: Not on file  Stress: Not on file  Social Connections: Not on file  Intimate Partner Violence: Not on file    No Known Allergies  Family History  Problem Relation Age of Onset   Hypertension Brother    Cancer Maternal Grandmother        unknown to pt    Prior to Admission medications   Medication Sig Start  Date End Date Taking? Authorizing Provider  amLODipine (NORVASC) 5 MG tablet Take 1 tablet (5 mg total) by mouth daily. 03/27/21  Yes Ghumman, Ramandeep, NP  betamethasone dipropionate (DIPROLENE) 0.05 % ointment Apply topically 2 (two) times daily. 03/27/21  Yes Ghumman, Ramandeep, NP  Budeson-Glycopyrrol-Formoterol (BREZTRI AEROSPHERE) 160-9-4.8 MCG/ACT AERO Inhale 160 mcg into the lungs daily. Patient taking differently: Inhale 160 mcg into the lungs in the morning and at bedtime. 04/24/21  Yes Ghumman, Ramandeep, NP  IBUPROFEN PO Take 1 tablet by mouth daily as needed (headache, pain).   Yes [provider]    Physical Exam: Vitals:   05/31/2021 1815 05/14/2021 1915 05/23/2021 1916 05/26/2021 1948  BP: (!) 142/100 138/90  (!) 137/91  Pulse: (!) 118 (!) 118  (!) 118  Resp: (!) 21 19  20   Temp: 98.4 F (36.9 C)  98.4 F (36.9 C) 98.6 F (37 C)  TempSrc:   Oral Oral  SpO2: 100% 91%  91%  Weight:    61.3 kg     General:  Appears calm and comfortable and is in NAD. Cachetic.  Eyes:  PERRL, EOMI, normal lids, iris ENT:  grossly normal hearing, lips & tongue, mmm; appropriate dentition Neck:  no LAD, masses or thyromegaly; no carotid bruits Cardiovascular:  RRR, no m/r/g. No LE edema.  Respiratory:   decreased breath sounds in right lung field. Normal respiratory effort. No wheezes/rales.  Abdomen:  soft, mild TTP in epigastric area, ND, NABS Back:   normal alignment, no CVAT Skin:  no rash or induration seen on limited exam Musculoskeletal:  grossly normal tone BUE/BLE, good ROM, no bony abnormality Lower extremity:  No LE edema.  Limited foot exam with no ulcerations.  2+ distal pulses. Psychiatric:  grossly normal mood and affect, speech fluent and appropriate, AOx3 Neurologic:  CN 2-12 grossly intact, moves all extremities in coordinated fashion, sensation intact  -declined GU/rectal  exam.    Radiological Exams on Admission: Independently reviewed - see discussion in A/P  where applicable  DG Chest 2 View  Result Date: 05/30/2021 CLINICAL DATA:  Shortness of breath EXAM: CHEST - 2 VIEW COMPARISON:  Radiograph 04/15/2021 FINDINGS: Unchanged cardiomediastinal silhouette. New right lower lobe airspace disease. No large pleural effusion or visible pneumothorax. There is prominence of the azygous vein and right paratracheal stripe. There is an unchanged compression deformity in midthoracic spine. No acute osseous abnormality. IMPRESSION: Right lower lobe pneumonia. Recommend follow-up chest radiograph in 6-12 weeks to ensure resolution. Electronically Signed   By: Maurine Simmering M.D.   On: 05/05/2021 14:21   CT Angio Chest PE W and/or Wo Contrast  Result Date: 05/10/2021 CLINICAL  DATA:  Pt here via EMS with c/o SOB with exertion. History of chronic bronchitis. Pt at PCP with SpO2 of 80. Placed on 2 liters with improvement of 100%. Chest tingling. Pt reports rectal bleeding X1 week EXAM: CT ANGIOGRAPHY CHEST WITH CONTRAST TECHNIQUE: Multidetector CT imaging of the chest was performed using the standard protocol during bolus administration of intravenous contrast. Multiplanar CT image reconstructions and MIPs were obtained to evaluate the vascular anatomy. CONTRAST:  57mL OMNIPAQUE IOHEXOL 350 MG/ML SOLN COMPARISON:  Chest x-ray 05/06/2021, chest x-ray 04/15/2021 FINDINGS: Cardiovascular: Satisfactory opacification of the pulmonary arteries to the segmental level. No evidence of pulmonary embolism. The main pulmonary artery is normal in caliber. There is narrowing but no definite significant stenosis of the right main pulmonary artery due to external mass effect. Normal heart size. Small to moderate volume pericardial effusion. Left-to-right midline shift. Mediastinum/Nodes: Infiltrative mass of the mediastinum with markedly limited evaluation of its borders due to timing of contrast: Measures up to at least 5.8 cm on axial imaging (10:229). Mediastinal lymphadenopathy with as an  example a 2.7 cm right paratracheal lymph node (2:58). No definite left hilar lymphadenopathy. No axillary nodes. Question extension of the mass into the carina (11:61). Thyroid gland demonstrate no significant findings. The esophagus is not well visualized due to abutment to the mass. Lungs/Pleura: Complete collapse of the right lower lobe with associated endobronchial debris/lesion. A 1.7 cm ground-glass airspace opacity at the right apex. Associated vague peripheral ground-glass airspace opacities within the right upper lobe. At least mild to moderate paraseptal and centrilobular emphysematous changes. No focal consolidation, pulmonary nodule, mass of the left lung. No left pleural effusion. No right pleural effusion. No pneumothorax. Upper Abdomen: No acute abnormality. Musculoskeletal: Minimally displaced subacute to chronic fracture of the mid sternal body. Associated underlying cirrhosis may represent pathologic fracture versus healing. Otherwise no suspicious lytic or blastic osseous lesions. No acute displaced fracture. Multilevel degenerative changes of the spine. Slight vertebral body height loss anteriorly of the T9 vertebral body. Review of the MIP images confirms the above findings. IMPRESSION: 1. No pulmonary embolus; however, mild narrowing with no significant stenosis of the right main pulmonary artery due to external mass effect from the mass described below. 2. Infiltrative mass of the mediastinum with markedly limited evaluation of its borders due to timing of contrast. Associated complete collapse of the right lower lobe with associated complete right lower lobe bronchi occlusion due to endobronchial debris/lesion. Question extension of the mass into the carina.Associated left-to-right midline shift due to decreased lung volume. Concern for underlying malignancy - likely of the mediastinum with extent into or around the right mainstem bronchi leading to collapse the right lower lobe. Additional  imaging evaluation or consultation with Pulmonology or Thoracic Surgery recommended. 3. Mediastinal lymphadenopathy. 4. A 1.7 cm ground-glass airspace opacity at the right apex. Associated vague peripheral ground-glass airspace opacities within the right upper lobe. 5. Small to moderate volume pericardial effusion. 6. Subacute to chronic fracture of the mid sternal body. Underlying sclerosis likely due to healing; however, pathologic fracture is not fully excluded. 7. Emphysema (ICD10-J43.9). Electronically Signed   By: Iven Finn M.D.   On: 05/18/2021 18:12    EKG: Independently reviewed.  tachycardia with rate 130; nonspecific ST changes with no evidence of acute ischemia   Labs on Admission: I have personally reviewed the available labs and imaging studies at the time of the admission.  Pertinent labs:  Hemoglobin 6.9,  WBC 30.8,  D-dimer 3.52,  initial  troponin 49--->8  lactic acid 2.4--> 2.7 BUN 31,  creatinine 1.58    Assessment/Plan Active Problems:   Acute hypoxemic respiratory failure ([place]) 50 year old with hypoxia to 80% on room air, respiratory distress and fatigue.  Likely multifactorial with findings of large mediastinal mass, right lower lobe pneumonia and small to moderate pericardial effusion  -Doing well on 2 L of oxygen via nasal cannula -Treat underlying problems and wean as tolerated -will need ambulatory test     Sepsis due to pneumonia ([place]) -Patient with white count of 30.8, tachycardia, tachypnea and findings of right lower lobe pneumonia on chest x-ray with shortness of breath, cough and hypoxia.  -Bolused in ED and will continue IV fluids -given cefepime in ED will change to Rocephin and azithromycin for CAP. 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  Likely contributing to  hypoxia and coughing and hemoptysis.  Will need urgent tx after diagnosis. -pulm consulted with plans for bronchoscopy tomorrow if medically stable.  -NPO -Echo pending  Pericardial effusion Echo pending     AKI (acute kidney injury) ([place]) -has had poor PO intake over the past 5 days -bolused in ED and will continue IVF over night -UA pending -monitor I/O, avoid nephrotoxic drugs     Symptomatic anemia secondary to BRBPR vs. Malignancy -declines rectal exam, will need this done and will hopefully acquiesce to day team -transfused 1 unit in ED. Repeat H&H and serial H&H q 4 hours x 3. Transfuse if hgb<7 -discussed with GI. No consult at this time. Needs rectal exam, blood. If continues to bleed will need to consult.   HTN Continue norvasc    COPD (chronic obstructive pulmonary disease) ([name]) -stable, continue inhalers, SABA prn   Tobacco abuse Stopped 6 days ago  Cachetic/poor PO intake Nutritional consult   Body mass index is 16.45 kg/m.    Level of care: Progressive DVT prophylaxis:  SCDs for bronchoscopy  Code Status:  Full - confirmed with patient Family Communication: None present Disposition Plan:  The patient is from: home  Anticipated d/c is to: home  Requires inpatient hospitalization and is at significant risk of worsening, requires constant monitoring, assessment, IV medication, procedures and MDM with specialists.   Patient is currently: acutely ill Consults called: pulmonology   Admission status:  inpatient   Dragon dictation used in completing this note.    Orma Flaming MD [facility]   How to contact the [facility] Attending or Consulting provider [place] or covering provider during after hours [place], for this patient?  Check the care team in [facility] and look for a) attending/consulting [facility] provider listed and b) the [facility] team listed Log into [email or web address] and use [place]'s universal password to access. If you do not have the password, please  contact the hospital operator. Locate the [facility] provider you are looking for under [facility] and page to a number that you can be directly reached. If you still have difficulty reaching the provider, please page the [facility] (Director on Call) for the Hospitalists listed on amion for assistance.   05/22/2021, 8:42 PM

## 2021-05-16 NOTE — Progress Notes (Signed)
Patient arrived in the unit from the ED on a stretcher, assessment completed see flow sheet, placed on tele CCMD notified, patient oriented to room and staff, patient reports rectal bleeding, PT refused assessment of the rectal bleeding, PT also refused a skin assessment. No sign of distress noted, call bell within reach will continue to monitor.Marland Kitchen

## 2021-05-16 NOTE — ED Provider Notes (Signed)
Original EKG was discussed with cardiology because of the concerns about possible inferior MI.  They said is more consistent with pericarditis.   Fredia Sorrow, MD 05/11/2021 1454

## 2021-05-16 NOTE — ED Provider Notes (Signed)
[facility]ERG[facility] DEPARTMENT Provider Note   CSN: [id number] Arrival date & time: 05/12/2021  1256     History Chief Complaint  Patient presents with   Shortness of Breath    Christopher Henry is a 50 y.o. male.  HPI This is a 50 year old male with history of COPD, hypertension, and hemorrhoids who presents from pulmonology clinic with hypoxia.  Patient has reportedly had shortness of breath with chest pain that has been progressively worsening since July.  Has been evaluated by PCP and ED for the same complaint with negative work-ups.  Today was being seen at pulmonologist when patient was found to be hypoxic to the mid 80s requiring oxygen by nasal cannula and was transferred to ED. He describes chest pain as central radiating out laterally, burning, intermittent, worse with exertion and associated with shortness of breath.  Not associated with diaphoresis, nausea, or vomiting.  Shortness of breath is associated with a cough that is mostly nonproductive but does occasionally have blood-streaked mucus.  Has known hemorrhoids present since teen years that will intermittently bleed, but has had ongoing bleeding daily for the last week per rectum described as bright red.  Reports 1 episode of hematemesis several days ago.  Has a history of daily alcohol use and reports drinking many beers every day.  Last drink was on Sunday.  No known history of liver disease or esophageal varices.  Not on blood thinners.    Past Medical History:  Diagnosis Date   Anxiety state, unspecified 11/09/2012   Hypertension     Patient Active Problem List   Diagnosis Date Noted   Acute hypoxemic respiratory failure ([place]) 05/03/2021   COPD with acute exacerbation ([place]) 05/12/2021   History of hemoptysis 05/09/2021   Anxiety state, unspecified 11/09/2012   Internal hemorrhoids with prolapse 11/09/2012    Past Surgical History:  Procedure Laterality Date   FINGER SURGERY     HIP SURGERY          Family History  Problem Relation Age of Onset   Hypertension Brother    Cancer Maternal Grandmother        unknown to pt    Social History   Tobacco Use   Smoking status: Former    Packs/day: 2.00    Years: 35.00    Pack years: 70.00    Types: Cigarettes    Quit date: 05/12/2021    Years since quitting: 0.0   Smokeless tobacco: Never  Substance Use Topics   Alcohol use: Yes    Comment: 1 pint a day   Drug use: No    Home Medications Prior to Admission medications   Medication Sig Start Date End Date Taking? Authorizing Provider  amLODipine (NORVASC) 5 MG tablet Take 1 tablet (5 mg total) by mouth daily. 03/27/21  Yes Ghumman, Ramandeep, NP  betamethasone dipropionate (DIPROLENE) 0.05 % ointment Apply topically 2 (two) times daily. 03/27/21  Yes Ghumman, Ramandeep, NP  Budeson-Glycopyrrol-Formoterol (BREZTRI AEROSPHERE) 160-9-4.8 MCG/ACT AERO Inhale 160 mcg into the lungs daily. Patient taking differently: Inhale 160 mcg into the lungs in the morning and at bedtime. 04/24/21  Yes Ghumman, Ramandeep, NP  IBUPROFEN PO Take 1 tablet by mouth daily as needed (headache, pain).   Yes [provider]    Allergies    Patient has no known allergies.  Review of Systems   Review of Systems  Constitutional:  Positive for fatigue. Negative for chills and fever.  HENT:  Negative for ear pain and sore  throat.   Eyes:  Negative for pain and visual disturbance.  Respiratory:  Positive for cough, chest tightness and shortness of breath.   Cardiovascular:  Positive for chest pain. Negative for palpitations.  Gastrointestinal:  Positive for blood in stool. Negative for abdominal pain and vomiting.  Genitourinary:  Negative for dysuria and hematuria.  Musculoskeletal:  Negative for arthralgias and back pain.  Skin:  Negative for color change and rash.  Neurological:  Negative for seizures and syncope.  All other systems reviewed and are negative.  Physical Exam Updated  Vital Signs BP (!) 117/93   Pulse 97   Temp 98 F (36.7 C) (Oral)   Resp (!) 23   SpO2 99%   Physical Exam  ED Results / Procedures / Treatments   Labs (all labs ordered are listed, but only abnormal results are displayed) Labs Reviewed  BASIC METABOLIC PANEL - Abnormal; Notable for the following components:      Result Value   Sodium 133 (*)    CO2 20 (*)    Glucose, Bld 147 (*)    BUN 29 (*)    Creatinine, Ser 1.60 (*)    GFR, Estimated 52 (*)    All other components within normal limits  CBC WITH DIFFERENTIAL/PLATELET - Abnormal; Notable for the following components:   WBC 30.8 (*)    RBC 2.73 (*)    Hemoglobin 6.9 (*)    HCT 21.6 (*)    MCV 79.1 (*)    MCH 25.3 (*)    RDW 20.0 (*)    Platelets 534 (*)    Neutro Abs 26.8 (*)    Monocytes Absolute 2.2 (*)    Abs Immature Granulocytes 0.37 (*)    All other components within normal limits  D-DIMER, QUANTITATIVE (NOT AT [facility]) - Abnormal; Notable for the following components:   D-Dimer, Quant 3.52 (*)    All other components within normal limits  LACTIC ACID, PLASMA - Abnormal; Notable for the following components:   Lactic Acid, Venous 2.4 (*)    All other components within normal limits  LACTIC ACID, PLASMA - Abnormal; Notable for the following components:   Lactic Acid, Venous 2.7 (*)    All other components within normal limits  COMPREHENSIVE METABOLIC PANEL - Abnormal; Notable for the following components:   Sodium 134 (*)    CO2 19 (*)    Glucose, Bld 111 (*)    BUN 31 (*)    Creatinine, Ser 1.58 (*)    Calcium 8.8 (*)    Albumin 3.3 (*)    GFR, Estimated 53 (*)    All other components within normal limits  PROTIME-INR - Abnormal; Notable for the following components:   Prothrombin Time 16.9 (*)    INR 1.4 (*)    All other components within normal limits  APTT - Abnormal; Notable for the following components:   aPTT 37 (*)    All other components within normal limits  TROPONIN I (HIGH SENSITIVITY) -  Abnormal; Notable for the following components:   Troponin I (High Sensitivity) 49 (*)    All other components within normal limits  RESP PANEL BY RT-PCR (FLU A&B, COVID) ARPGX2  CULTURE, BLOOD (SINGLE)  BRAIN NATRIURETIC PEPTIDE  LIPASE, BLOOD  AMMONIA  URINALYSIS, ROUTINE W REFLEX MICROSCOPIC  HEMOGLOBIN AND HEMATOCRIT, BLOOD  HEMOGLOBIN AND HEMATOCRIT, BLOOD  PROCALCITONIN  PROCALCITONIN  HIV ANTIBODY (ROUTINE TESTING W REFLEX)  BASIC METABOLIC PANEL  CBC  POC OCCULT BLOOD, ED  TYPE AND  SCREEN  ABO/RH  PREPARE RBC (CROSSMATCH)  TROPONIN I (HIGH SENSITIVITY)    EKG None  Radiology DG Chest 2 View  Result Date: 05/07/2021 CLINICAL DATA:  Shortness of breath EXAM: CHEST - 2 VIEW COMPARISON:  Radiograph 04/15/2021 FINDINGS: Unchanged cardiomediastinal silhouette. New right lower lobe airspace disease. No large pleural effusion or visible pneumothorax. There is prominence of the azygous vein and right paratracheal stripe. There is an unchanged compression deformity in midthoracic spine. No acute osseous abnormality. IMPRESSION: Right lower lobe pneumonia. Recommend follow-up chest radiograph in 6-12 weeks to ensure resolution. Electronically Signed   By: Maurine Simmering M.D.   On: 05/15/2021 14:21   CT Angio Chest PE W and/or Wo Contrast  Result Date: 06/02/2021 CLINICAL DATA:  Pt here via EMS with c/o SOB with exertion. History of chronic bronchitis. Pt at PCP with SpO2 of 80. Placed on 2 liters with improvement of 100%. Chest tingling. Pt reports rectal bleeding X1 week EXAM: CT ANGIOGRAPHY CHEST WITH CONTRAST TECHNIQUE: Multidetector CT imaging of the chest was performed using the standard protocol during bolus administration of intravenous contrast. Multiplanar CT image reconstructions and MIPs were obtained to evaluate the vascular anatomy. CONTRAST:  38mL OMNIPAQUE IOHEXOL 350 MG/ML SOLN COMPARISON:  Chest x-ray 05/05/2021, chest x-ray 04/15/2021 FINDINGS: Cardiovascular:  Satisfactory opacification of the pulmonary arteries to the segmental level. No evidence of pulmonary embolism. The main pulmonary artery is normal in caliber. There is narrowing but no definite significant stenosis of the right main pulmonary artery due to external mass effect. Normal heart size. Small to moderate volume pericardial effusion. Left-to-right midline shift. Mediastinum/Nodes: Infiltrative mass of the mediastinum with markedly limited evaluation of its borders due to timing of contrast: Measures up to at least 5.8 cm on axial imaging (10:229). Mediastinal lymphadenopathy with as an example a 2.7 cm right paratracheal lymph node (2:58). No definite left hilar lymphadenopathy. No axillary nodes. Question extension of the mass into the carina (11:61). Thyroid gland demonstrate no significant findings. The esophagus is not well visualized due to abutment to the mass. Lungs/Pleura: Complete collapse of the right lower lobe with associated endobronchial debris/lesion. A 1.7 cm ground-glass airspace opacity at the right apex. Associated vague peripheral ground-glass airspace opacities within the right upper lobe. At least mild to moderate paraseptal and centrilobular emphysematous changes. No focal consolidation, pulmonary nodule, mass of the left lung. No left pleural effusion. No right pleural effusion. No pneumothorax. Upper Abdomen: No acute abnormality. Musculoskeletal: Minimally displaced subacute to chronic fracture of the mid sternal body. Associated underlying cirrhosis may represent pathologic fracture versus healing. Otherwise no suspicious lytic or blastic osseous lesions. No acute displaced fracture. Multilevel degenerative changes of the spine. Slight vertebral body height loss anteriorly of the T9 vertebral body. Review of the MIP images confirms the above findings. IMPRESSION: 1. No pulmonary embolus; however, mild narrowing with no significant stenosis of the right main pulmonary artery due  to external mass effect from the mass described below. 2. Infiltrative mass of the mediastinum with markedly limited evaluation of its borders due to timing of contrast. Associated complete collapse of the right lower lobe with associated complete right lower lobe bronchi occlusion due to endobronchial debris/lesion. Question extension of the mass into the carina.Associated left-to-right midline shift due to decreased lung volume. Concern for underlying malignancy - likely of the mediastinum with extent into or around the right mainstem bronchi leading to collapse the right lower lobe. Additional imaging evaluation or consultation with Pulmonology or Thoracic Surgery recommended.  3. Mediastinal lymphadenopathy. 4. A 1.7 cm ground-glass airspace opacity at the right apex. Associated vague peripheral ground-glass airspace opacities within the right upper lobe. 5. Small to moderate volume pericardial effusion. 6. Subacute to chronic fracture of the mid sternal body. Underlying sclerosis likely due to healing; however, pathologic fracture is not fully excluded. 7. Emphysema (ICD10-J43.9). Electronically Signed   By: Iven Finn M.D.   On: 05/24/2021 18:12    Procedures Procedures   Medications Ordered in ED Medications  0.9 %  sodium chloride infusion (10 mL/hr Intravenous Not Given 05/23/2021 1643)  lactated ringers infusion ( Intravenous New Bag/Given 05/06/2021 2135)  amLODipine (NORVASC) tablet 5 mg (has no administration in time range)  cefTRIAXone (ROCEPHIN) 1 g in sodium chloride 0.9 % 100 mL IVPB (1 g Intravenous New Bag/Given 05/06/2021 2140)  azithromycin (ZITHROMAX) 500 mg in sodium chloride 0.9 % 250 mL IVPB (500 mg Intravenous New Bag/Given 05/15/2021 2137)  albuterol (PROVENTIL) (2.5 MG/3ML) 0.083% nebulizer solution 2.5 mg (has no administration in time range)  mometasone-formoterol (DULERA) 200-5 MCG/ACT inhaler 2 puff (2 puffs Inhalation Given 05/26/2021 2224)    And  umeclidinium bromide  (INCRUSE ELLIPTA) 62.5 MCG/INH 1 puff (has no administration in time range)  LORazepam (ATIVAN) tablet 1 mg (1 mg Oral Given 05/25/2021 1317)  lactated ringers bolus 2,000 mL (2,000 mLs Intravenous New Bag/Given 05/29/2021 1642)  ceFEPIme (MAXIPIME) 2 g in sodium chloride 0.9 % 100 mL IVPB (0 g Intravenous Stopped 05/27/2021 1640)  iohexol (OMNIPAQUE) 350 MG/ML injection 65 mL (65 mLs Intravenous Contrast Given 05/21/2021 1723)    ED Course  I have reviewed the triage vital signs and the nursing notes.  Pertinent labs & imaging results that were available during my care of the patient were reviewed by me and considered in my medical decision making (see chart for details).    MDM Rules/Calculators/A&P                          On arrival patient is tachycardic to the 130s and hypoxic to 88% on room air.  Started on 2 L of oxygen by nasal cannula with improvement to greater than 95%.  He is afebrile and without associated hypotension.  Does have some decreased breath sounds at the left lung base but otherwise physical exam is unremarkable.  No lower extremity swelling or other signs of volume overload.  Items on the differential for chest pain, shortness of breath, new O2 requirement include pneumonia, pneumothorax, ACS, CHF, anemia, and PE.   Initial labs with white blood count elevated at 30 and lactate of 2.4 and chest x-ray with evidence of right lower lobe pneumonia.  Code sepsis initiated with blood cultures, 30 cc/kg of IV fluids, and broad-spectrum antibiotics.  EKG without signs of acute ischemia and patient not currently having any chest pain.  Troponin is elevated at 49 but is likely demand ischemia in the setting of acute illness.   CBC also significant for hemoglobin of 6.9 down from 12 a month ago.  Type and cross ordered.  Patient consented to blood transfusion.  Will give 1 unit of packed red cells.  Bright red blood per rectum on exam without obvious source.  No ongoing bleeding and no  hematemesis.  Patient does have an history of alcohol use but no known history of esophageal varices.  Given no hematemesis and no ongoing bleeding low suspicion for bleeding varices at this time.  Creatinine mildly elevated at 1.6  from baseline of 1.1, but otherwise unremarkable.  D-dimer is significantly elevated concerning for PE.  Will obtain CTA PE study to further evaluate.  Patient counseled on risks of IV contrast in the setting of kidney injury but feel that benefit of clinical knowledge outweighs the risk at this time.  Patient consented to the scan.  Given IV fluids prior to scan.  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  Very concerning for underlying malignancy.  Patient remained stable on 2 L of oxygen by nasal cannula.  Will require admit for further evaluation and likely bronc to further characterize new mediastinal lung mass.  We will also require GI consult for GI bleeding requiring transfusion.  Final Clinical Impression(s) / ED Diagnoses Final diagnoses:  Hypoxia  Gastrointestinal hemorrhage, unspecified gastrointestinal hemorrhage type  Acute blood loss anemia    Rx / DC Orders ED Discharge Orders     None        Coralee Pesa, MD 05/06/2021 0046    Tegeler, Gwenyth Allegra, MD 06/01/2021 1302

## 2021-05-16 NOTE — ED Provider Notes (Signed)
Emergency Medicine Provider Triage Evaluation Note  Christopher Henry , a 50 y.o. male  was evaluated in triage.  Pt complains of sob.  Review of Systems  Positive: Sob on exertion, new oxygen requirement, rectal bleeding, hemoptysis Negative: Fever, n/v/d  Physical Exam  There were no vitals taken for this visit. Gen:   Awake, no distress   Resp:  Normal effort  MSK:   Moves extremities without difficulty  Other:  tachycardic  Medical Decision Making  Medically screening exam initiated at 12:57 PM.  Appropriate orders placed.  Christopher Henry was informed that the remainder of the evaluation will be completed by another provider, this initial triage assessment does not replace that evaluation, and the importance of remaining in the ED until their evaluation is complete.  Pt sent here from pulmonology office today when he went for a first time visit but was found to be hypoxic.  Likely needing admission for acute hypoxic respiratory failure, and will likely benefit from PE study.  For the past several days he has tried quitting heavy tobacco and alcohol use.    Is tachy, concerns for alcohol withdrawal.  Also hypoxic.  Appears pale, has had rectal bleeding and burning sensation in chest and have cough up blood once.    Christopher Moras, PA-C 05/12/2021 1308    Christopher Morrison, MD 05/22/21 [phone]

## 2021-05-16 NOTE — Progress Notes (Signed)
Subjective:   PATIENT ID: Christopher Henry GENDER: male DOB: 12-10-1970, MRN: [id number]   HPI  Chief Complaint  Patient presents with   Consult    Pt was diagnosed with bronchitis 01/2021. States he has had a lot of pain and has SOB due to pain. Pt states at times he will cough up blood.   Reason for Visit: New consult for COPD  Christopher Henry is a 50 year old male active smoker with COPD, HTN and anxiety who presents as a new patient.  He was recently seen in the ED on 04/15/21 for COPD exacerbation. Note reviewed. He declined scan for hemoptysis and productive cough. He was discharged home. He has also had ED visits earlier this summer for chest pain that to be related to cough and costochondritis. He is referred by his PCP, Dr. Dan Europe. Note from 9/22 reviewed. He has had chronic bronchitis with wheezing and hemoptysis. Breztri was prescribed. CT Chest was also ordered.  He reports he is fatigued. Quit smoking 6 days ago. Previously smoked 2ppd since 15 years. He reports intermittent blood mixed in sputum with last episode >1 week ago. With any activity he has shortness of breath and wheezing. He reports tingling in extremities in bilateral upper arms. Has never needed oxygen. He has been compliant with Breztri. He is unable to ambulate short distances and was barely able to walk to the clinic room due to severe shortness of breath, requiring wheelchair  Social History: Former smoker. Quit 05/2021.  Environmental exposures:   I have personally reviewed patient's past medical/family/social history, allergies, current medications.  Past Medical History:  Diagnosis Date   Anxiety state, unspecified 11/09/2012   Hypertension      Family History  Problem Relation Age of Onset   Hypertension Brother    Cancer Maternal Grandmother        unknown to pt     Social History   Occupational History   Not on file  Tobacco Use   Smoking status: Every Day    Packs/day: 1.00    Types:  Cigarettes   Smokeless tobacco: Never  Substance and Sexual Activity   Alcohol use: Yes    Comment: 1 pint a day   Drug use: No   Sexual activity: Not on file    No Known Allergies   Outpatient Medications Prior to Visit  Medication Sig Dispense Refill   amLODipine (NORVASC) 5 MG tablet Take 1 tablet (5 mg total) by mouth daily. 30 tablet 1   betamethasone dipropionate (DIPROLENE) 0.05 % ointment Apply topically 2 (two) times daily. 30 g 0   Budeson-Glycopyrrol-Formoterol (BREZTRI AEROSPHERE) 160-9-4.8 MCG/ACT AERO Inhale 160 mcg into the lungs daily. 1 g 1   doxycycline (VIBRAMYCIN) 100 MG capsule Take 1 capsule (100 mg total) by mouth 2 (two) times daily. 20 capsule 0   ibuprofen (ADVIL) 600 MG tablet Take 1 tablet (600 mg total) by mouth every 6 (six) hours as needed. 30 tablet 0   No facility-administered medications prior to visit.    Review of Systems  Constitutional:  Negative for chills, diaphoresis, fever, malaise/fatigue and weight loss.  HENT:  Negative for congestion, ear pain and sore throat.   Respiratory:  Positive for cough, sputum production, shortness of breath and wheezing. Negative for hemoptysis.   Cardiovascular:  Negative for chest pain, palpitations and leg swelling.  Gastrointestinal:  Negative for abdominal pain, heartburn and nausea.  Genitourinary:  Negative for frequency.  Musculoskeletal:  Negative for joint  pain and myalgias.  Skin:  Negative for itching and rash.  Neurological:  Negative for dizziness, weakness and headaches.  Endo/Heme/Allergies:  Does not bruise/bleed easily.  Psychiatric/Behavioral:  Negative for depression. The patient is not nervous/anxious.     Objective:   Vitals:   05/29/2021 1136 05/24/2021 1143  BP: 114/64   Pulse: (!) 54   SpO2: (!) 80% 94%  Weight: 131 lb (59.4 kg)   Height: 6\' 4"  (1.93 m)     Physical Exam: General: Chronically ill-appearing, thin cachectic male in respiratory distress HENT: [place], AT, OP clear,  MMM Eyes: EOMI, no scleral icterus Respiratory: Diminished breath sounds bilaterally.  Expiratory wheeze Cardiovascular: RRR, -M/R/G, no JVD Extremities:-Edema,-tenderness Neuro: AAO x4, CNII-XII grossly intact Skin: Intact, no rashes or bruising Psych: Normal mood, normal affect  Data Reviewed:  Imaging: CXR 04/15/21 - No effusion, edema or infiltrate  PFT: None on file  Labs: CBC    Component Value Date/Time   WBC 18.0 (H) 04/15/2021 1245   RBC 4.87 04/15/2021 1245   HGB 12.0 (L) 04/15/2021 1245   HCT 38.1 (L) 04/15/2021 1245   PLT 399 04/15/2021 1245   MCV 78.2 (L) 04/15/2021 1245   MCH 24.6 (L) 04/15/2021 1245   MCHC 31.5 04/15/2021 1245   RDW 20.7 (H) 04/15/2021 1245   LYMPHSABS 1.8 04/15/2021 1245   MONOABS 1.4 (H) 04/15/2021 1245   EOSABS 0.1 04/15/2021 1245   BASOSABS 0.2 (H) 04/15/2021 1245   Absolute eos 04/15/21 - 100    Assessment & Plan:   Discussion: Acute hypoxemic respiratory failure COPD Exacerbation History of hemoptysis, likely secondary to bronchitis  50 year old male active smoker with probable COPD who presents as a new patient. He had difficulty ambulating in the waiting room and required wheelchair. Vitals significant for bradycardia in the 50s and SpO2 80% on room air. He was placed on 3L via [place] which improved SpO2 to 94% at rest. Recommend ED evaluation and admission for acute hypoxemic respiratory failure secondary to suspected COPD exacerbation. Would consider CTA to rule out PE. Emergency services was called for transport. Patient expressed understanding and agreed to plan.  Health Maintenance Immunization History  Administered Date(s) Administered   PFIZER(Purple Top)SARS-COV-2 Vaccination 01/05/2020, 01/26/2020   CT Lung Screen - not qualified due to age  No orders of the defined types were placed in this encounter. No orders of the defined types were placed in this encounter.   No follow-ups on file.  I have spent a total time  of 60-minutes on the day of the appointment reviewing prior documentation, coordinating care and discussing medical diagnosis and plan with the patient/family. Imaging, labs and tests included in this note have been reviewed and interpreted independently by me.  [place], MD [facility] 05/10/2021 11:29 AM  Office Number [phone]

## 2021-05-17 ENCOUNTER — Encounter (HOSPITAL_COMMUNITY): Admission: EM | Disposition: E | Payer: Self-pay | Source: Ambulatory Visit | Attending: Family Medicine

## 2021-05-17 ENCOUNTER — Inpatient Hospital Stay: Admission: RE | Admit: 2021-05-17 | Payer: 59 | Source: Ambulatory Visit

## 2021-05-17 ENCOUNTER — Inpatient Hospital Stay (HOSPITAL_COMMUNITY): Payer: 59

## 2021-05-17 ENCOUNTER — Inpatient Hospital Stay (HOSPITAL_COMMUNITY): Payer: 59 | Admitting: Anesthesiology

## 2021-05-17 ENCOUNTER — Encounter (HOSPITAL_COMMUNITY): Payer: Self-pay | Admitting: Family Medicine

## 2021-05-17 ENCOUNTER — Other Ambulatory Visit (HOSPITAL_COMMUNITY): Payer: 59

## 2021-05-17 DIAGNOSIS — J9859 Other diseases of mediastinum, not elsewhere classified: Secondary | ICD-10-CM

## 2021-05-17 DIAGNOSIS — I3139 Other pericardial effusion (noninflammatory): Secondary | ICD-10-CM | POA: Diagnosis not present

## 2021-05-17 DIAGNOSIS — K922 Gastrointestinal hemorrhage, unspecified: Secondary | ICD-10-CM | POA: Diagnosis not present

## 2021-05-17 DIAGNOSIS — D62 Acute posthemorrhagic anemia: Secondary | ICD-10-CM

## 2021-05-17 DIAGNOSIS — J9601 Acute respiratory failure with hypoxia: Secondary | ICD-10-CM | POA: Diagnosis not present

## 2021-05-17 DIAGNOSIS — R918 Other nonspecific abnormal finding of lung field: Secondary | ICD-10-CM

## 2021-05-17 HISTORY — PX: BRONCHIAL BRUSHINGS: SHX5108

## 2021-05-17 HISTORY — PX: BRONCHIAL WASHINGS: SHX5105

## 2021-05-17 HISTORY — PX: VIDEO BRONCHOSCOPY: SHX5072

## 2021-05-17 HISTORY — PX: CRYOTHERAPY: SHX6894

## 2021-05-17 HISTORY — PX: HEMOSTASIS CONTROL: SHX6838

## 2021-05-17 LAB — URINALYSIS, ROUTINE W REFLEX MICROSCOPIC
Bacteria, UA: NONE SEEN
Bilirubin Urine: NEGATIVE
Glucose, UA: NEGATIVE mg/dL
Hgb urine dipstick: NEGATIVE
Ketones, ur: NEGATIVE mg/dL
Leukocytes,Ua: NEGATIVE
Nitrite: NEGATIVE
Protein, ur: 100 mg/dL — AB
Specific Gravity, Urine: 1.029 (ref 1.005–1.030)
pH: 5 (ref 5.0–8.0)

## 2021-05-17 LAB — BASIC METABOLIC PANEL
Anion gap: 12 (ref 5–15)
BUN: 27 mg/dL — ABNORMAL HIGH (ref 6–20)
CO2: 19 mmol/L — ABNORMAL LOW (ref 22–32)
Calcium: 8.4 mg/dL — ABNORMAL LOW (ref 8.9–10.3)
Chloride: 105 mmol/L (ref 98–111)
Creatinine, Ser: 1.22 mg/dL (ref 0.61–1.24)
GFR, Estimated: >60 mL/min (ref >60)
Glucose, Bld: 104 mg/dL — ABNORMAL HIGH (ref 70–99)
Potassium: 4 mmol/L (ref 3.5–5.1)
Sodium: 136 mmol/L (ref 135–145)

## 2021-05-17 LAB — CBC
HCT: 23 % — ABNORMAL LOW (ref 39.0–52.0)
Hemoglobin: 7.8 g/dL — ABNORMAL LOW (ref 13.0–17.0)
MCH: 26.2 pg (ref 26.0–34.0)
MCHC: 33.9 g/dL (ref 30.0–36.0)
MCV: 77.2 fL — ABNORMAL LOW (ref 80.0–100.0)
Platelets: 393 10*3/uL (ref 150–400)
RBC: 2.98 MIL/uL — ABNORMAL LOW (ref 4.22–5.81)
RDW: 18.3 % — ABNORMAL HIGH (ref 11.5–15.5)
WBC: 25.3 10*3/uL — ABNORMAL HIGH (ref 4.0–10.5)
nRBC: 0.1 % (ref 0.0–0.2)

## 2021-05-17 LAB — ECHOCARDIOGRAM COMPLETE
AR max vel: 2.87 cm2
AV Area VTI: 2.93 cm2
AV Area mean vel: 2.66 cm2
AV Mean grad: 4 mmHg
AV Peak grad: 5.9 mmHg
Ao pk vel: 1.21 m/s
Area-P 1/2: 5.34 cm2
Height: 76 in
S' Lateral: 2.6 cm
Weight: 2162.27 oz

## 2021-05-17 LAB — PREPARE RBC (CROSSMATCH)

## 2021-05-17 LAB — HEMOGLOBIN AND HEMATOCRIT, BLOOD
HCT: 20.2 % — ABNORMAL LOW (ref 39.0–52.0)
HCT: 21.8 % — ABNORMAL LOW (ref 39.0–52.0)
HCT: 21.4 % — ABNORMAL LOW (ref 39.0–52.0)
Hemoglobin: 6.6 g/dL — CL (ref 13.0–17.0)
Hemoglobin: 7.1 g/dL — ABNORMAL LOW (ref 13.0–17.0)
Hemoglobin: 7.1 g/dL — ABNORMAL LOW (ref 13.0–17.0)

## 2021-05-17 LAB — PROCALCITONIN: Procalcitonin: 0.25 ng/mL

## 2021-05-17 SURGERY — BRONCHOSCOPY, WITH FLUOROSCOPY
Anesthesia: General | Laterality: Bilateral

## 2021-05-17 MED ORDER — PROPOFOL 10 MG/ML IV BOLUS
INTRAVENOUS | Status: DC | PRN
  Administered 2021-05-17: 150 mg via INTRAVENOUS

## 2021-05-17 MED ORDER — ACETAMINOPHEN 325 MG PO TABS
650.0000 mg | ORAL_TABLET | Freq: Four times a day (QID) | ORAL | Status: DC | PRN
  Administered 2021-05-17 – 2021-05-18 (×3): 650 mg via ORAL
  Filled 2021-05-17 (×3): qty 2

## 2021-05-17 MED ORDER — PEG-KCL-NACL-NASULF-NA ASC-C 100 G PO SOLR: 1.0000 | Freq: Once | ORAL | Status: DC

## 2021-05-17 MED ORDER — MIDAZOLAM HCL 2 MG/2ML IJ SOLN
INTRAMUSCULAR | Status: AC
  Filled 2021-05-17: qty 2

## 2021-05-17 MED ORDER — FOLIC ACID 1 MG PO TABS
1.0000 mg | ORAL_TABLET | Freq: Every day | ORAL | Status: DC
  Administered 2021-05-18 – 2021-06-05 (×17): 1 mg via ORAL
  Filled 2021-05-17 (×19): qty 1

## 2021-05-17 MED ORDER — SODIUM CHLORIDE 0.9 % IV SOLN: INTRAVENOUS | Status: DC

## 2021-05-17 MED ORDER — LORAZEPAM 1 MG PO TABS: 1.0000 mg | ORAL_TABLET | ORAL | Status: AC | PRN

## 2021-05-17 MED ORDER — SODIUM CHLORIDE 0.9% IV SOLUTION: Freq: Once | INTRAVENOUS | Status: AC

## 2021-05-17 MED ORDER — PEG 3350-KCL-NABCB-NACL-NASULF 236 G PO SOLR
4000.0000 mL | Freq: Once | ORAL | Status: AC
  Administered 2021-05-17: 4000 mL via ORAL
  Filled 2021-05-17: qty 4000

## 2021-05-17 MED ORDER — PANTOPRAZOLE SODIUM 40 MG PO TBEC
40.0000 mg | DELAYED_RELEASE_TABLET | Freq: Two times a day (BID) | ORAL | Status: DC
  Administered 2021-05-17 – 2021-05-26 (×18): 40 mg via ORAL
  Filled 2021-05-17 (×18): qty 1

## 2021-05-17 MED ORDER — PROPOFOL 10 MG/ML IV BOLUS
INTRAVENOUS | Status: AC
  Filled 2021-05-17: qty 20

## 2021-05-17 MED ORDER — FENTANYL CITRATE (PF) 100 MCG/2ML IJ SOLN
INTRAMUSCULAR | Status: DC | PRN
  Administered 2021-05-17: 100 ug via INTRAVENOUS

## 2021-05-17 MED ORDER — LIDOCAINE HCL URETHRAL/MUCOSAL 2 % EX GEL: 1.0000 "application " | Freq: Once | CUTANEOUS | Status: DC

## 2021-05-17 MED ORDER — THIAMINE HCL 100 MG/ML IJ SOLN
100.0000 mg | Freq: Every day | INTRAMUSCULAR | Status: DC
  Administered 2021-05-30: 100 mg via INTRAVENOUS
  Filled 2021-05-17 (×2): qty 2

## 2021-05-17 MED ORDER — FENTANYL CITRATE (PF) 250 MCG/5ML IJ SOLN
INTRAMUSCULAR | Status: AC
  Filled 2021-05-17: qty 5

## 2021-05-17 MED ORDER — ALBUMIN HUMAN 5 % IV SOLN
INTRAVENOUS | Status: DC | PRN
Start: 2021-05-17 — End: 2021-05-17

## 2021-05-17 MED ORDER — LACTATED RINGERS IV SOLN: INTRAVENOUS | Status: DC | PRN

## 2021-05-17 MED ORDER — PEG 3350-KCL-NABCB-NACL-NASULF 236 G PO SOLR
4000.0000 mL | Freq: Once | ORAL | Status: AC
  Administered 2021-05-18: 4000 mL via ORAL
  Filled 2021-05-17: qty 4000

## 2021-05-17 MED ORDER — SUCCINYLCHOLINE CHLORIDE 200 MG/10ML IV SOSY
PREFILLED_SYRINGE | INTRAVENOUS | Status: DC | PRN
  Administered 2021-05-17: 100 mg via INTRAVENOUS

## 2021-05-17 MED ORDER — SUGAMMADEX SODIUM 200 MG/2ML IV SOLN
INTRAVENOUS | Status: DC | PRN
  Administered 2021-05-17: 400 mg via INTRAVENOUS

## 2021-05-17 MED ORDER — ROCURONIUM BROMIDE 10 MG/ML (PF) SYRINGE
PREFILLED_SYRINGE | INTRAVENOUS | Status: DC | PRN
  Administered 2021-05-17: 40 mg via INTRAVENOUS

## 2021-05-17 MED ORDER — ONDANSETRON HCL 4 MG/2ML IJ SOLN
INTRAMUSCULAR | Status: DC | PRN
  Administered 2021-05-17: 4 mg via INTRAVENOUS

## 2021-05-17 MED ORDER — ADULT MULTIVITAMIN W/MINERALS CH
1.0000 | ORAL_TABLET | Freq: Every day | ORAL | Status: DC
  Administered 2021-05-18 – 2021-06-03 (×14): 1 via ORAL
  Filled 2021-05-17 (×19): qty 1

## 2021-05-17 MED ORDER — LACTATED RINGERS IV SOLN
INTRAVENOUS | Status: AC | PRN
  Administered 2021-05-17: 1000 mL via INTRAVENOUS

## 2021-05-17 MED ORDER — LORAZEPAM 2 MG/ML IJ SOLN: 1.0000 mg | INTRAMUSCULAR | Status: AC | PRN

## 2021-05-17 MED ORDER — PHENYLEPHRINE HCL (PRESSORS) 10 MG/ML IV SOLN
INTRAVENOUS | Status: DC | PRN
  Administered 2021-05-17 (×2): 100 ug via INTRAVENOUS

## 2021-05-17 MED ORDER — SODIUM CHLORIDE (PF) 0.9 % IJ SOLN
PREFILLED_SYRINGE | INTRAMUSCULAR | Status: DC | PRN
  Administered 2021-05-17: 2 mL

## 2021-05-17 MED ORDER — LIDOCAINE 2% (20 MG/ML) 5 ML SYRINGE
INTRAMUSCULAR | Status: DC | PRN
  Administered 2021-05-17: 30 mg via INTRAVENOUS

## 2021-05-17 MED ORDER — THIAMINE HCL 100 MG PO TABS
100.0000 mg | ORAL_TABLET | Freq: Every day | ORAL | Status: DC
  Administered 2021-05-18 – 2021-06-05 (×16): 100 mg via ORAL
  Filled 2021-05-17 (×19): qty 1

## 2021-05-17 NOTE — Progress Notes (Signed)
   06/02/2021 0107  Assess: MEWS Score  Pulse Rate (!) 109  ECG Heart Rate (!) 109  Resp (!) 23  SpO2 95 %  Assess: MEWS Score  MEWS Temp 0  MEWS Systolic 0  MEWS Pulse 1  MEWS RR 1  MEWS LOC 0  MEWS Score 2  MEWS Score Color Yellow  Notify: Provider  Provider Burton Apley MD  Date Provider Notified 05/30/2021  Time Provider Notified 0111  Notification Type Page  Notification Reason Critical result (hemoglobin 6.6)

## 2021-05-17 NOTE — Transfer of Care (Signed)
Immediate Anesthesia Transfer of Care Note  Patient: Christopher Henry  Procedure(s) Performed: VIDEO BRONCHOSCOPY WITH  BIOPSY (Bilateral) BRONCHIAL WASHINGS BRONCHIAL BRUSHINGS CRYOTHERAPY HEMOSTASIS CONTROL  Patient Location: PACU  Anesthesia Type:General  Level of Consciousness: awake, alert  and oriented  Airway & Oxygen Therapy: Patient Spontanous Breathing and Patient connected to nasal cannula oxygen  Post-op Assessment: Report given to RN and Post -op Vital signs reviewed and stable  Post vital signs: Reviewed and stable  Last Vitals:  Vitals Value Taken Time  BP 111/84 05/05/2021 1045  Temp 36.8 C 05/30/2021 1045  Pulse 37 05/13/2021 1052  Resp 25 05/12/2021 1053  SpO2 82 % 05/03/2021 1052  Vitals shown include unvalidated device data.  Last Pain:  Vitals:   05/27/2021 1045  TempSrc:   PainSc: 0-No pain         Complications: No notable events documented.

## 2021-05-17 NOTE — Consult Note (Signed)
Referring Provider:  Irene Pap, DO         Primary Care Physician:  Bary Castilla, NP Primary Gastroenterologist: Althia Forts            Reason for Consultation:  Hematochezia                 ASSESSMENT /  PLAN    Hematochezia, occasional melena Anemia Weight loss Mediastinal mass Alcohol use Patient presents with hematochezia over the last 1.5 weeks and occasional melena prior to that.  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  Hemoglobin dropped from 12 on 04/15/21 to 6.9 upon presentation. Unclear whether he has a brisk upper GI bleed or lower GI bleed. No prior scopes. With normal platelet count, I am not concerned about variceal bleed despite his significant alcohol use history. He saturating well on room air this morning despite his CT findings of a mediastinal mass with some lung collapse and possible pneumonia. We will plan to proceed with EGD and colonoscopy to determine the source of his hematochezia and melena - Trend Hb - Check ferritin, iron/TIBC - PPI BID - Plan for EGD/colonoscopy tomorrow. CLD after his bronch. Moviprep this evening. NPO after MN except for prep.   HPI:     Christopher Henry is a 50 y.o. male with history of alcohol use, tobacco use, HTN, and anxiety presents with SOB, weakness, and rectal bleeding.  He states that for about a week and a half, he has had issues with shortness of breath, lightheadedness, generalized weakness, and rectal bleeding.  He first noted the rectal bleeding when he woke up and saw that his bed sheets have been soiled with blood.  He has continued to have rectal bleeding since then.  Describes the bleeding as both bright red and dark red.  He has had hemorrhoids since he was a teenager, but this is more substantial bleeding than he has seen previously.  Also states that he has occasionally seen melena.  Denies use of blood thinners.  He does take ibuprofen every day.  He drinks about 5 tall  beers per day and sometimes drinks hard liquor.  He smokes 1.5 packs/day and has for about the last 35 years.  He estimates that since he moved to [place] 20 years ago, he has lost over 40 pounds.  About 6 to 7 pounds of the weight loss was over the last 1.5 weeks.  Denies family history of GI cancers.  He has never had an EGD or colonoscopy in the past.  Denies dysphagia, odynophagia, abdominal pain.  He typically has loose stools when he drinks beers.  Denies constipation or diarrhea.  States that he typically eats 1-2 meals per day.   Past Medical History:  Diagnosis Date   Anxiety state, unspecified 11/09/2012   Hypertension     Past Surgical History:  Procedure Laterality Date   FINGER SURGERY     HIP SURGERY      Prior to Admission medications   Medication Sig Start Date End Date Taking? Authorizing Provider  amLODipine (NORVASC) 5 MG tablet Take 1 tablet (5 mg total) by mouth daily. 03/27/21  Yes Ghumman, Ramandeep, NP  betamethasone dipropionate (DIPROLENE) 0.05 % ointment Apply topically 2 (two) times daily. 03/27/21  Yes Ghumman, Ramandeep, NP  Budeson-Glycopyrrol-Formoterol (BREZTRI AEROSPHERE) 160-9-4.8 MCG/ACT AERO Inhale 160 mcg into the lungs daily. Patient taking differently: Inhale 160 mcg into the lungs in the morning and at bedtime. 04/24/21  Yes Ghumman, Ramandeep, NP  IBUPROFEN PO Take 1 tablet by mouth daily as needed (headache, pain).   Yes [provider]    Current Facility-Administered Medications  Medication Dose Route Frequency Provider Last Rate Last Admin   [MAR Hold] 0.9 %  sodium chloride infusion  10 mL/hr Intravenous Once Coralee Pesa, MD       [facility] Hold] acetaminophen (TYLENOL) tablet 650 mg  650 mg Oral Q6H PRN Shela Leff, MD   650 mg at 05/06/2021 0244   [MAR Hold] albuterol (PROVENTIL) (2.5 MG/3ML) 0.083% nebulizer solution 2.5 mg  2.5 mg Nebulization Q4H PRN Orma Flaming, MD       [facility] Hold] azithromycin (ZITHROMAX) 500 mg  in sodium chloride 0.9 % 250 mL IVPB  500 mg Intravenous Q24H Orma Flaming, MD 250 mL/hr at 05/06/2021 2137 500 mg at 05/04/2021 2137   [MAR Hold] cefTRIAXone (ROCEPHIN) 1 g in sodium chloride 0.9 % 100 mL IVPB  1 g Intravenous Q24H Orma Flaming, MD 200 mL/hr at 05/24/2021 2140 1 g at 05/14/2021 7048   [MAR Hold] folic acid (FOLVITE) tablet 1 mg  1 mg Oral Daily Irene Pap N, DO       lactated ringers infusion   Intravenous Continuous Orma Flaming, MD 75 mL/hr at 05/21/2021 2135 New Bag at 05/07/2021 2135   [MAR Hold] LORazepam (ATIVAN) tablet 1-4 mg  1-4 mg Oral Q1H PRN Kayleen Memos, DO       Or   [MAR Hold] LORazepam (ATIVAN) injection 1-4 mg  1-4 mg Intravenous Q1H PRN Hall, Carole N, DO       [MAR Hold] mometasone-formoterol (DULERA) 200-5 MCG/ACT inhaler 2 puff  2 puff Inhalation BID Orma Flaming, MD   2 puff at 05/26/2021 0847   And   [MAR Hold] umeclidinium bromide (INCRUSE ELLIPTA) 62.5 MCG/INH 1 puff  1 puff Inhalation Daily Orma Flaming, MD       [facility] Hold] multivitamin with minerals tablet 1 tablet  1 tablet Oral Daily Kayleen Memos, DO       [MAR Hold] thiamine tablet 100 mg  100 mg Oral Daily Hall, Carole N, DO       Or   [MAR Hold] thiamine (B-1) injection 100 mg  100 mg Intravenous Daily Irene Pap N, DO        Allergies as of 05/30/2021   (No Known Allergies)    Family History  Problem Relation Age of Onset   Hypertension Brother    Cancer Maternal Grandmother        unknown to pt    Social History   Tobacco Use   Smoking status: Former    Packs/day: 2.00    Years: 35.00    Pack years: 70.00    Types: Cigarettes    Quit date: 05/12/2021    Years since quitting: 0.0   Smokeless tobacco: Never  Substance Use Topics   Alcohol use: Yes    Comment: 1 pint a day   Drug use: No    Review of Systems: All systems reviewed and negative except where noted in HPI.  Physical Exam: Vital signs in last 24 hours: Temp:  [98 F (36.7 C)-100.7 F (38.2 C)] 98.7 F  (37.1 C) (10/15 0914) Pulse Rate:  [54-134] 119 (10/15 0914) Resp:  [16-29] 21 (10/15 0914) BP: (92-152)/(62-100) 111/62 (10/15 0914) SpO2:  [80 %-100 %] 94 % (10/15 0914) Weight:  [59.4 kg-61.3 kg] 61.3 kg (10/14 1948) Last BM Date: 05/13/21 General:  Awake, alert, NAD, cachectic appearing Psych:  Pleasant, cooperative. Normal mood and affect. Eyes:  Pupils equal, sclera clear, no icterus.    Neck:  Supple; no masses Lungs:  Clear anteriorly Heart:  Regular rate and rhythm; no murmurs Abdomen:  Soft, non-distended, mildly tender in the LLQ  Extremities: No BLE Neurologic:  Alert. Some shaking of his hands when raised. Skin:  Intact without significant lesions or rashes.   Intake/Output from previous day: 10/14 0701 - 10/15 0700 In: 1496.5 [I.V.:381.5; Blood:665; IV Piggyback:450] Out: -  Intake/Output this shift: No intake/output data recorded.  Lab Results: Recent Labs    05/18/2021 1341 06/02/2021 2050 05/12/2021 0014 05/11/2021 0743  WBC 30.8*  --   --  25.3*  HGB 6.9* 7.2* 6.6* 7.8*  HCT 21.6* 22.1* 20.2* 23.0*  PLT 534*  --   --  393   BMET Recent Labs    05/18/2021 1341 05/30/2021 1604  NA 133* 134*  K 3.9 4.2  CL 101 101  CO2 20* 19*  GLUCOSE 147* 111*  BUN 29* 31*  CREATININE 1.60* 1.58*  CALCIUM 9.0 8.8*   LFT Recent Labs    05/23/2021 1604  PROT 7.6  ALBUMIN 3.3*  AST 15  ALT 8  ALKPHOS 61  BILITOT 0.5   PT/INR Recent Labs    05/18/2021 1604  LABPROT 16.9*  INR 1.4*   Hepatitis Panel No results for input(s): HEPBSAG, HCVAB, HEPAIGM, HEPBIGM in the last 72 hours.   . CBC Latest Ref Rng & Units 05/03/2021 05/14/2021 05/30/2021  WBC 4.0 - 10.5 K/uL 25.3(H) - -  Hemoglobin 13.0 - 17.0 g/dL 7.8(L) 6.6(LL) 7.2(L)  Hematocrit 39.0 - 52.0 % 23.0(L) 20.2(L) 22.1(L)  Platelets 150 - 400 K/uL 393 - -    . CMP Latest Ref Rng & Units 05/10/2021 05/14/2021 04/15/2021  Glucose 70 - 99 mg/dL 111(H) 147(H) 87  BUN 6 - 20 mg/dL 31(H) 29(H) 14   Creatinine 0.61 - 1.24 mg/dL 1.58(H) 1.60(H) 0.84  Sodium 135 - 145 mmol/L 134(L) 133(L) 136  Potassium 3.5 - 5.1 mmol/L 4.2 3.9 4.1  Chloride 98 - 111 mmol/L 101 101 102  CO2 22 - 32 mmol/L 19(L) 20(L) 22  Calcium 8.9 - 10.3 mg/dL 8.8(L) 9.0 9.7  Total Protein 6.5 - 8.1 g/dL 7.6 - -  Total Bilirubin 0.3 - 1.2 mg/dL 0.5 - -  Alkaline Phos 38 - 126 U/L 61 - -  AST 15 - 41 U/L 15 - -  ALT 0 - 44 U/L 8 - -   Studies/Results: DG Chest 2 View  Result Date: 05/07/2021 CLINICAL DATA:  Shortness of breath EXAM: CHEST - 2 VIEW COMPARISON:  Radiograph 04/15/2021 FINDINGS: Unchanged cardiomediastinal silhouette. New right lower lobe airspace disease. No large pleural effusion or visible pneumothorax. There is prominence of the azygous vein and right paratracheal stripe. There is an unchanged compression deformity in midthoracic spine. No acute osseous abnormality. IMPRESSION: Right lower lobe pneumonia. Recommend follow-up chest radiograph in 6-12 weeks to ensure resolution. Electronically Signed   By: Maurine Simmering M.D.   On: 05/15/2021 14:21   CT Angio Chest PE W and/or Wo Contrast  Result Date: 05/23/2021 CLINICAL DATA:  Pt here via EMS with c/o SOB with exertion. History of chronic bronchitis. Pt at PCP with SpO2 of 80. Placed on 2 liters with improvement of 100%. Chest tingling. Pt reports rectal bleeding X1 week EXAM: CT ANGIOGRAPHY CHEST WITH CONTRAST TECHNIQUE: Multidetector CT imaging of the chest was performed using the  standard protocol during bolus administration of intravenous contrast. Multiplanar CT image reconstructions and MIPs were obtained to evaluate the vascular anatomy. CONTRAST:  55mL OMNIPAQUE IOHEXOL 350 MG/ML SOLN COMPARISON:  Chest x-ray 06/01/2021, chest x-ray 04/15/2021 FINDINGS: Cardiovascular: Satisfactory opacification of the pulmonary arteries to the segmental level. No evidence of pulmonary embolism. The main pulmonary artery is normal in caliber. There is narrowing but no  definite significant stenosis of the right main pulmonary artery due to external mass effect. Normal heart size. Small to moderate volume pericardial effusion. Left-to-right midline shift. Mediastinum/Nodes: Infiltrative mass of the mediastinum with markedly limited evaluation of its borders due to timing of contrast: Measures up to at least 5.8 cm on axial imaging (10:229). Mediastinal lymphadenopathy with as an example a 2.7 cm right paratracheal lymph node (2:58). No definite left hilar lymphadenopathy. No axillary nodes. Question extension of the mass into the carina (11:61). Thyroid gland demonstrate no significant findings. The esophagus is not well visualized due to abutment to the mass. Lungs/Pleura: Complete collapse of the right lower lobe with associated endobronchial debris/lesion. A 1.7 cm ground-glass airspace opacity at the right apex. Associated vague peripheral ground-glass airspace opacities within the right upper lobe. At least mild to moderate paraseptal and centrilobular emphysematous changes. No focal consolidation, pulmonary nodule, mass of the left lung. No left pleural effusion. No right pleural effusion. No pneumothorax. Upper Abdomen: No acute abnormality. Musculoskeletal: Minimally displaced subacute to chronic fracture of the mid sternal body. Associated underlying cirrhosis may represent pathologic fracture versus healing. Otherwise no suspicious lytic or blastic osseous lesions. No acute displaced fracture. Multilevel degenerative changes of the spine. Slight vertebral body height loss anteriorly of the T9 vertebral body. Review of the MIP images confirms the above findings. IMPRESSION: 1. No pulmonary embolus; however, mild narrowing with no significant stenosis of the right main pulmonary artery due to external mass effect from the mass described below. 2. Infiltrative mass of the mediastinum with markedly limited evaluation of its borders due to timing of contrast. Associated  complete collapse of the right lower lobe with associated complete right lower lobe bronchi occlusion due to endobronchial debris/lesion. Question extension of the mass into the carina.Associated left-to-right midline shift due to decreased lung volume. Concern for underlying malignancy - likely of the mediastinum with extent into or around the right mainstem bronchi leading to collapse the right lower lobe. Additional imaging evaluation or consultation with Pulmonology or Thoracic Surgery recommended. 3. Mediastinal lymphadenopathy. 4. A 1.7 cm ground-glass airspace opacity at the right apex. Associated vague peripheral ground-glass airspace opacities within the right upper lobe. 5. Small to moderate volume pericardial effusion. 6. Subacute to chronic fracture of the mid sternal body. Underlying sclerosis likely due to healing; however, pathologic fracture is not fully excluded. 7. Emphysema (ICD10-J43.9). Electronically Signed   By: Iven Finn M.D.   On: 05/30/2021 18:12    Active Problems:   Acute hypoxemic respiratory failure ([name])   Symptomatic anemia   Mediastinal mass   AKI (acute kidney injury) ([place])   BRBPR (bright red blood per rectum)   Sepsis due to pneumonia ([place])   COPD (chronic obstructive pulmonary disease) ([name])    Christia Reading, M.D. @  05/23/2021, 9:23 AM

## 2021-05-17 NOTE — Progress Notes (Signed)
Bronchoscopy showed large subcarinal mass indenting the carina and bulging and bilaterally with large mainstem lesion obscuring 50% of lumen.  Biopsies taken with cryo and hemostasis achieved. I have called radiation oncology consult  Zaccheaus Storlie V. Elsworth Soho MD

## 2021-05-17 NOTE — Progress Notes (Addendum)
PROGRESS NOTE  Christopher Henry QPY:[id number] DOB: 10/17/1970 DOA: 05/26/2021 PCP: Bary Castilla, NP  HPI/Recap of past 24 hours: Christopher Henry is a 50 y.o. male with medical history significant of HTN, alcohol abuse was brought in to [facility] ED via EMS from pulmonary's office due to acute respiratory distress.  Found to be hypoxic 80% on room air.  Placed on 3L [place] with improvement to 94%.  Not on oxygen supplementation at baseline.  Endorses a nonproductive cough with hemoptysis over the past 2 months.  Associated with burning sensation across his chest, and greater than 50 pound weight loss over the last 12 months.  He has a history of tobacco abuse 1 pack/day for the past 35 years, quit a few days ago.  Consumes 1 pint of alcohol daily, last alcoholic drink was about a week ago.  Complains of painless rectal bleeding.  No prior history of colonoscopy or EGD.  He is not aware of any family history of GI cancer.   Work up in the ED revealed: Hemoglobin 6.9, WBC 30.8, D-dimer 3.52, initial troponin 49, lactic acid 2.4, BUN 31, creatinine 1.58 Chest x-ray shows right lower lobe pneumonia. CTA: No PE.  Filtrated mass of the mediastinum with markedly limited evaluation of its borders due to timing of contrast.  Measures up to at least 5.8 cm question extension of the mass into the carina.  Complete collapse of the right lower lobe with associated endobronchial debris/lesion.  Associated left to right midline shift due to decreased lung volume.  Concern for malignancy.  05/10/2021: Patient was seen and examined at his bedside.  His wife was present at bedside.  He denies having any rectal pain, stated that the had rectal bleeding this morning.  No abdominal pain.  Admitted to pain across his chest which he states is positional.   Assessment/Plan: Active Problems:   Acute hypoxemic respiratory failure ([name])   Symptomatic anemia   Mediastinal mass   AKI (acute kidney injury) ([place])   BRBPR (bright red  blood per rectum)   Sepsis due to pneumonia ([name])   COPD (chronic obstructive pulmonary disease) ([name])   Acute blood loss anemia   Lung mass  Acute hypoxic respiratory failure secondary to large mediastinal mass, right lower lobe pneumonia and small to moderate pericardial effusion. Not on oxygen supplementation at baseline Currently requiring 2 L to maintain O2 saturation greater than 92%. Wean off oxygen supplementation at baseline Continue IV antibiotics Incentive spirometer Plan for bronchoscopy by Dr. Elsworth Soho, PCCM, this morning for possible biopsies or brushings.  Sepsis secondary to right lower lobe pneumonia Presented with cough, hypoxia, leukocytosis 30.8, tachycardia, tachypnea and findings of right lower lobe pneumonia on chest x-ray, personally reviewed. Febrile this morning with T-max 100.7 Monitor fever curve and WBC. He is currently on Rocephin and IV azithromycin, continue Continue bronchodilators as recommended by pulmonary.  Acute blood loss anemia in the setting of painless rectal bleeding/microcytic anemia Baseline hemoglobin appears to be 12.0 from 04/15/2021 He presented with hemoglobin of 6.9 on 05/11/2021. Received 1 unit PRBC in the ED Hemoglobin dropped earlier this morning, down to 6.6 with rectal bleeding reported this morning. Received another unit PRBC on 05/13/2021 Repeated hemoglobin 7.8 with MCV 77 after blood transfusion GI consulted Plan for EGD and colonoscopy on 05/18/2021 Currently on clear liquid diet, n.p.o. after midnight. Has received total of 2 unit PRBC transfusion during this admission Trend H&H every 6 hours. Obtain iron studies  Mediastinal mass, mediastinal lymphadenopathy, malignancy suspected Plan  for bronchoscopy for possible biopsies or brushings Medical oncology consult  AKI, suspect prerenal in the setting of dehydration Baseline creatinine appears to be 1.2 with GFR greater than 60 Presented with creatinine of 1.60 with GFR  52. Avoid nephrotoxic agents, dehydration and hypotension Monitor urine output Repeat renal panel in the morning.  Anion gap metabolic acidosis in the setting of acute renal insufficiency Serum bicarb 19, anion gap 12 from 14 Continue gentle IV fluid hydration Repeat BMP in the morning  Sinus tachycardia, suspect multifactorial secondary to lung physiology, dehydration, and possible component of alcohol withdrawal CIWA protocol in place IV antibiotics for pneumonia Gentle IV fluid hydration  Alcohol abuse Reported drinks more than 1 pint of alcohol per day, last alcoholic drink was almost a week ago. CIWA protocol in place Continue multivitamins, thiamine and folic acid supplements. TOC consulted to provide resources for alcohol cessation  Tobacco abuse He has a history of tobacco abuse 1 pack/day for the past 35 years, quit a few days ago.  Tobacco cessation counseling at bedside.    Critical care time spent: 75 minutes.   Code Status: Full code  Family Communication: Wife at bedside  Disposition Plan: Likely will discharge to home once pulmonary, GI, and medical oncology sign off.   Consultants: Pulmonary GI Medical oncology  Procedures: Bronchoscopy on 05/04/2021 by Dr. Elsworth Soho  Antimicrobials: Rocephin IV azithromycin.  DVT prophylaxis: SCDs  Status is: Inpatient          Objective: Vitals:   05/18/2021 0814 05/12/2021 0848 05/30/2021 0914 05/03/2021 1045  BP: 97/66  111/62 111/84  Pulse:   (!) 119 (!) 110  Resp:   (!) 21 20  Temp:   98.7 F (37.1 C) 98.2 F (36.8 C)  TempSrc:   Temporal   SpO2:  99% 94% 100%  Weight:        Intake/Output Summary (Last 24 hours) at 05/26/2021 1101 Last data filed at 05/23/2021 1040 Gross per 24 hour  Intake 2346.52 ml  Output 3 ml  Net 2343.52 ml   Filed Weights   05/10/2021 1948  Weight: 61.3 kg    Exam:  General: 50 y.o. year-old male well developed well nourished in no acute distress.  Alert and  oriented x3. Cardiovascular: Tachycardic with no rubs or gallops.  No thyromegaly or JVD noted.   Respiratory: Clear to auscultation with no wheezes or rales. Good inspiratory effort. Abdomen: Soft nontender nondistended with normal bowel sounds x4 quadrants. Musculoskeletal: No lower extremity edema. 2/4 pulses in all 4 extremities. Skin: No ulcerative lesions noted or rashes, Psychiatry: Mood is anxious for condition and setting   Data Reviewed: CBC: Recent Labs  Lab 05/18/2021 1341 05/21/2021 2050 05/25/2021 0014 05/24/2021 0743  WBC 30.8*  --   --  25.3*  NEUTROABS 26.8*  --   --   --   HGB 6.9* 7.2* 6.6* 7.8*  HCT 21.6* 22.1* 20.2* 23.0*  MCV 79.1*  --   --  77.2*  PLT 534*  --   --  867   Basic Metabolic Panel: Recent Labs  Lab 05/09/2021 1341 05/22/2021 1604 05/30/2021 0743  NA 133* 134* 136  K 3.9 4.2 4.0  CL 101 101 105  CO2 20* 19* 19*  GLUCOSE 147* 111* 104*  BUN 29* 31* 27*  CREATININE 1.60* 1.58* 1.22  CALCIUM 9.0 8.8* 8.4*   GFR: Estimated Creatinine Clearance: 62.8 mL/min (by C-G formula based on SCr of 1.22 mg/dL). Liver Function Tests: Recent Labs  Lab 05/18/2021  1604  AST 15  ALT 8  ALKPHOS 61  BILITOT 0.5  PROT 7.6  ALBUMIN 3.3*   Recent Labs  Lab 05/04/2021 1604  LIPASE 29   Recent Labs  Lab 05/10/2021 1604  AMMONIA <10   Coagulation Profile: Recent Labs  Lab 05/09/2021 1604  INR 1.4*   Cardiac Enzymes: No results for input(s): CKTOTAL, CKMB, CKMBINDEX, TROPONINI in the last 168 hours. BNP (last 3 results) No results for input(s): PROBNP in the last 8760 hours. HbA1C: No results for input(s): HGBA1C in the last 72 hours. CBG: No results for input(s): GLUCAP in the last 168 hours. Lipid Profile: No results for input(s): CHOL, HDL, LDLCALC, TRIG, CHOLHDL, LDLDIRECT in the last 72 hours. Thyroid Function Tests: No results for input(s): TSH, T4TOTAL, FREET4, T3FREE, THYROIDAB in the last 72 hours. Anemia Panel: No results for input(s):  VITAMINB12, FOLATE, FERRITIN, TIBC, IRON, RETICCTPCT in the last 72 hours. Urine analysis:    Component Value Date/Time   COLORURINE YELLOW 08/24/2012 2043   APPEARANCEUR HAZY (A) 08/24/2012 2043   LABSPEC 1.022 08/24/2012 2043   PHURINE 5.5 08/24/2012 2043   GLUCOSEU NEGATIVE 08/24/2012 2043   HGBUR NEGATIVE 08/24/2012 2043   BILIRUBINUR negative 03/27/2021 1728   KETONESUR 15 (A) 08/24/2012 2043   PROTEINUR Positive (A) 03/27/2021 1728   PROTEINUR 30 (A) 08/24/2012 2043   UROBILINOGEN 0.2 03/27/2021 1728   UROBILINOGEN 1.0 08/24/2012 2043   NITRITE negative 03/27/2021 1728   NITRITE NEGATIVE 08/24/2012 2043   LEUKOCYTESUR Negative 03/27/2021 1728   Sepsis Labs: '@LABRCNTIP' (procalcitonin:4,lacticidven:4)  ) Recent Results (from the past 240 hour(s))  Resp Panel by RT-PCR (Flu A&B, Covid) Nasopharyngeal Swab     Status: None   Collection Time: 05/13/2021  3:38 PM   Specimen: Nasopharyngeal Swab; Nasopharyngeal(NP) swabs in vial transport medium  Result Value Ref Range Status   SARS Coronavirus 2 by RT PCR NEGATIVE NEGATIVE Final    Comment: (NOTE) SARS-CoV-2 target nucleic acids are NOT DETECTED.  The SARS-CoV-2 RNA is generally detectable in upper respiratory specimens during the acute phase of infection. The lowest concentration of SARS-CoV-2 viral copies this assay can detect is 138 copies/mL. A negative result does not preclude SARS-Cov-2 infection and should not be used as the sole basis for treatment or other patient management decisions. A negative result may occur with  improper specimen collection/handling, submission of specimen other than nasopharyngeal swab, presence of viral mutation(s) within the areas targeted by this assay, and inadequate number of viral copies(<138 copies/mL). A negative result must be combined with clinical observations, patient history, and epidemiological information. The expected result is Negative.  Fact Sheet for Patients:   EntrepreneurPulse.com.au  Fact Sheet for Healthcare Providers:  IncredibleEmployment.be  This test is no t yet approved or cleared by the [place] FDA and  has been authorized for detection and/or diagnosis of SARS-CoV-2 by FDA under an Emergency Use Authorization (EUA). This EUA will remain  in effect (meaning this test can be used) for the duration of the COVID-19 declaration under Section 564(b)(1) of the Act, 21 U.S.C.section 360bbb-3(b)(1), unless the authorization is terminated  or revoked sooner.       Influenza A by PCR NEGATIVE NEGATIVE Final   Influenza B by PCR NEGATIVE NEGATIVE Final    Comment: (NOTE) The Xpert Xpress SARS-CoV-2/FLU/RSV plus assay is intended as an aid in the diagnosis of influenza from Nasopharyngeal swab specimens and should not be used as a sole basis for treatment. Nasal washings and aspirates are unacceptable  for Xpert Xpress SARS-CoV-2/FLU/RSV testing.  Fact Sheet for Patients: EntrepreneurPulse.com.au  Fact Sheet for Healthcare Providers: IncredibleEmployment.be  This test is not yet approved or cleared by the [place] FDA and has been authorized for detection and/or diagnosis of SARS-CoV-2 by FDA under an Emergency Use Authorization (EUA). This EUA will remain in effect (meaning this test can be used) for the duration of the COVID-19 declaration under Section 564(b)(1) of the Act, 21 U.S.C. section 360bbb-3(b)(1), unless the authorization is terminated or revoked.  Performed at [facility], [place] [address] [place], [place] [address]   Blood culture (routine single)     Status: None (Preliminary result)   Collection Time: 05/05/2021  4:04 PM   Specimen: BLOOD  Result Value Ref Range Status   Specimen Description BLOOD LEFT ANTECUBITAL  Final   Special Requests   Final    BOTTLES DRAWN AEROBIC AND ANAEROBIC Blood Culture results may not be optimal  due to an inadequate volume of blood received in culture bottles   Culture   Final    NO GROWTH < 24 HOURS Performed at [facility], [place] [address] [place], [place] [address]    Report Status PENDING  Incomplete      Studies: DG Chest 2 View  Result Date: 05/22/2021 CLINICAL DATA:  Shortness of breath EXAM: CHEST - 2 VIEW COMPARISON:  Radiograph 04/15/2021 FINDINGS: Unchanged cardiomediastinal silhouette. New right lower lobe airspace disease. No large pleural effusion or visible pneumothorax. There is prominence of the azygous vein and right paratracheal stripe. There is an unchanged compression deformity in midthoracic spine. No acute osseous abnormality. IMPRESSION: Right lower lobe pneumonia. Recommend follow-up chest radiograph in 6-12 weeks to ensure resolution. Electronically Signed   By: Maurine Simmering M.D.   On: 05/08/2021 14:21   CT Angio Chest PE W and/or Wo Contrast  Result Date: 06/01/2021 CLINICAL DATA:  Pt here via EMS with c/o SOB with exertion. History of chronic bronchitis. Pt at PCP with SpO2 of 80. Placed on 2 liters with improvement of 100%. Chest tingling. Pt reports rectal bleeding X1 week EXAM: CT ANGIOGRAPHY CHEST WITH CONTRAST TECHNIQUE: Multidetector CT imaging of the chest was performed using the standard protocol during bolus administration of intravenous contrast. Multiplanar CT image reconstructions and MIPs were obtained to evaluate the vascular anatomy. CONTRAST:  58m OMNIPAQUE IOHEXOL 350 MG/ML SOLN COMPARISON:  Chest x-ray 05/25/2021, chest x-ray 04/15/2021 FINDINGS: Cardiovascular: Satisfactory opacification of the pulmonary arteries to the segmental level. No evidence of pulmonary embolism. The main pulmonary artery is normal in caliber. There is narrowing but no definite significant stenosis of the right main pulmonary artery due to external mass effect. Normal heart size. Small to moderate volume pericardial effusion. Left-to-right midline shift.  Mediastinum/Nodes: Infiltrative mass of the mediastinum with markedly limited evaluation of its borders due to timing of contrast: Measures up to at least 5.8 cm on axial imaging (10:229). Mediastinal lymphadenopathy with as an example a 2.7 cm right paratracheal lymph node (2:58). No definite left hilar lymphadenopathy. No axillary nodes. Question extension of the mass into the carina (11:61). Thyroid gland demonstrate no significant findings. The esophagus is not well visualized due to abutment to the mass. Lungs/Pleura: Complete collapse of the right lower lobe with associated endobronchial debris/lesion. A 1.7 cm ground-glass airspace opacity at the right apex. Associated vague peripheral ground-glass airspace opacities within the right upper lobe. At least mild to moderate paraseptal and centrilobular emphysematous changes. No focal consolidation, pulmonary nodule, mass  of the left lung. No left pleural effusion. No right pleural effusion. No pneumothorax. Upper Abdomen: No acute abnormality. Musculoskeletal: Minimally displaced subacute to chronic fracture of the mid sternal body. Associated underlying cirrhosis may represent pathologic fracture versus healing. Otherwise no suspicious lytic or blastic osseous lesions. No acute displaced fracture. Multilevel degenerative changes of the spine. Slight vertebral body height loss anteriorly of the T9 vertebral body. Review of the MIP images confirms the above findings. IMPRESSION: 1. No pulmonary embolus; however, mild narrowing with no significant stenosis of the right main pulmonary artery due to external mass effect from the mass described below. 2. Infiltrative mass of the mediastinum with markedly limited evaluation of its borders due to timing of contrast. Associated complete collapse of the right lower lobe with associated complete right lower lobe bronchi occlusion due to endobronchial debris/lesion. Question extension of the mass into the carina.Associated  left-to-right midline shift due to decreased lung volume. Concern for underlying malignancy - likely of the mediastinum with extent into or around the right mainstem bronchi leading to collapse the right lower lobe. Additional imaging evaluation or consultation with Pulmonology or Thoracic Surgery recommended. 3. Mediastinal lymphadenopathy. 4. A 1.7 cm ground-glass airspace opacity at the right apex. Associated vague peripheral ground-glass airspace opacities within the right upper lobe. 5. Small to moderate volume pericardial effusion. 6. Subacute to chronic fracture of the mid sternal body. Underlying sclerosis likely due to healing; however, pathologic fracture is not fully excluded. 7. Emphysema (ICD10-J43.9). Electronically Signed   By: Iven Finn M.D.   On: 05/07/2021 18:12    Scheduled Meds:  [MAR Hold] folic acid  1 mg Oral Daily   lidocaine  1 application Topical Once   [MAR Hold] mometasone-formoterol  2 puff Inhalation BID   And   [MAR Hold] umeclidinium bromide  1 puff Inhalation Daily   [MAR Hold] multivitamin with minerals  1 tablet Oral Daily   pantoprazole  40 mg Oral BID   peg 3350 powder  1 kit Oral Once   [MAR Hold] thiamine  100 mg Oral Daily   Or   [MAR Hold] thiamine  100 mg Intravenous Daily    Continuous Infusions:  [MAR Hold] sodium chloride     sodium chloride     [MAR Hold] azithromycin 500 mg (05/27/2021 2137)   [MAR Hold] cefTRIAXone (ROCEPHIN)  IV 1 g (06/02/2021 2140)   lactated ringers 75 mL/hr at 05/14/2021 2135     LOS: 1 day     Kayleen Memos, MD [facility] Pager [phone]  If 7PM-7AM, please contact night-coverage [email or web address] Password [email or web address] 06/01/2021, 11:01 AM

## 2021-05-17 NOTE — Op Note (Signed)
  Name:  [name] MRN:  [id number] DOB:  17-Dec-1970  PROCEDURE NOTE  Procedure(s): Flexible bronchoscopy [phone]) Brushing [phone] of the right mainstem mass Bronchial alveolar lavage (19166) of the right mainstem Endobronchial biopsy (06004) of the right mainstem mass using cryotherapy   Indications:  Hilar / mediastinal lymphadenopathy.  Consent:  Written informed consent was obtained prior to the procedure. The risks of the procedure including coughing, bleeding and the small chance of lung puncture requiring chest tube were discussed in great detail. The benefits & alternatives including serial follow up were also discussed.  Anesthesia:  General endotracheal.  Procedure summary:  Appropriate equipment was assembled.  The patient was  identified as Christopher Henry. Interim history obtained and brought to the endoscopy room. Safety timeout was performed. The patient was placed supine on the operating table, airway established and general anesthesia administered by Anesthesia team.   After the appropriate level of anesthesia was assured, flexible video bronchoscope was lubricated and inserted through the endotracheal tube.    Airway examination was performed bilaterally to subsegmental level.   Large mass was noted bulging from the subcarinal region and indenting both sides of the carina.  Left mainstem could be traversed and distal airways were patent.  Right mainstem had a large endobronchial lesion prior to takeoff of right upper lobe which appeared very vascular and could not be traversed BAL and brushings were obtained from the right mainstem mass. Using cryotherapy, 2-3 biopsies were obtained from this mass.  There was bleeding and hemostasis was achieved with cryo, cold saline and 1 in 10,000 epinephrine   After ensuring hemostasis and suctioning out blood that had spillover into left-sided airways, the bronchoscope was withdrawn.  The patient was extubated in endoscopy room and  transferred to PACU.   Specimens sent: Bronchial alveolar lavage specimen of the right mainstem for  cytology. Brushings for cytology Endobronchial biopsy for pathology  Complications:  No immediate complications were noted.  Hemodynamic parameters and oxygenation remained stable throughout the procedure.  Estimated blood loss:  Less then 15 mL.   Kara Mead MD. [facility]. [facility] Pager [phone] If no response call [phone]   05/31/2021 10:33 AM

## 2021-05-17 NOTE — Progress Notes (Addendum)
Returned from PACU.  Alert & Oriented.  Denies pain.  CCMD notified.  Vital signs per protocol.  Fiance at bedside. Pt noted coughing- productive yellow/white sputum.

## 2021-05-17 NOTE — Anesthesia Preprocedure Evaluation (Addendum)
Anesthesia Evaluation  Patient identified by MRN, date of birth, ID band Patient awake    Reviewed: Allergy & Precautions, NPO status , Patient's Chart, lab work & pertinent test results  Airway Mallampati: II  TM Distance: >3 FB     Dental   Pulmonary pneumonia, COPD,  COPD inhaler, former smoker,    breath sounds clear to auscultation       Cardiovascular hypertension, Pt. on medications  Rhythm:Regular Rate:Normal  05/05/2021 IMPRESSIONS    1. Left ventricular ejection fraction, by estimation, is 60 to 65%. The  left ventricle has normal function. The left ventricle has no regional  wall motion abnormalities. Left ventricular diastolic parameters were  normal.  2. Right ventricular systolic function is normal. The right ventricular  size is normal.  3. Large circumferential pericardial effusion largest in posterior and  lateral aspects of LV IVC not well seen no respiratory variation in mitral  inflow and only RA diastolic collapse not RV. Given likely diagnosis of  metastatic lung cancer consider  CVTS consult for pericardial window and for both Rx and diagnostic reasons  . Large pericardial effusion. The pericardial effusion is circumferential.  4. The mitral valve is normal in structure. Trivial mitral valve  regurgitation. No evidence of mitral stenosis.  5. The aortic valve is tricuspid. Aortic valve regurgitation is not  visualized. Mild to moderate aortic valve sclerosis/calcification is  present, without any evidence of aortic stenosis.  6. The inferior vena cava is normal in size with greater than 50%  respiratory variability, suggesting right atrial pressure of 3 mmHg.    Neuro/Psych PSYCHIATRIC DISORDERS Anxiety negative neurological ROS     GI/Hepatic negative GI ROS, Neg liver ROS, (+)     substance abuse  alcohol use,   Endo/Other  negative endocrine ROS  Renal/GU Renal disease      Musculoskeletal negative musculoskeletal ROS (+)   Abdominal   Peds  Hematology negative hematology ROS (+) anemia ,   Anesthesia Other Findings   Reproductive/Obstetrics                            Anesthesia Physical Anesthesia Plan  ASA: 4  Anesthesia Plan: MAC   Post-op Pain Management:    Induction: Intravenous  PONV Risk Score and Plan: 1 and Propofol infusion  Airway Management Planned: Nasal Cannula and Simple Face Mask  Additional Equipment:   Intra-op Plan:   Post-operative Plan:   Informed Consent: I have reviewed the patients History and Physical, chart, labs and discussed the procedure including the risks, benefits and alternatives for the proposed anesthesia with the patient or authorized representative who has indicated his/her understanding and acceptance.     Dental advisory given  Plan Discussed with: Anesthesiologist and CRNA  Anesthesia Plan Comments:        Anesthesia Quick Evaluation

## 2021-05-17 NOTE — Progress Notes (Addendum)
This RN and Herbert Pun NT offered to help patient with pericare and personal hygiene, PT refused, he stated that his rectal bleeding stinks and that ''you all will need to put me to sleep first before helping me to clean up''. PT encouraged to inform this RN whenever he is ready to clean up and that we will help him set up the materials needed to perform his hygiene activities if he chooses to have it done by himself, will continue to monitor.

## 2021-05-17 NOTE — Anesthesia Postprocedure Evaluation (Signed)
Anesthesia Post Note  Patient: Christopher Henry  Procedure(s) Performed: VIDEO BRONCHOSCOPY WITH  BIOPSY (Bilateral) BRONCHIAL WASHINGS BRONCHIAL BRUSHINGS CRYOTHERAPY HEMOSTASIS CONTROL     Patient location during evaluation: PACU Anesthesia Type: General Level of consciousness: awake Pain management: pain level controlled Vital Signs Assessment: post-procedure vital signs reviewed and stable Respiratory status: spontaneous breathing Cardiovascular status: stable Postop Assessment: no apparent nausea or vomiting Anesthetic complications: no   No notable events documented.  Last Vitals:  Vitals:   05/28/2021 1115 05/12/2021 1200  BP: 114/85 108/78  Pulse: (!) 110 (!) 109  Resp: 20 (!) 22  Temp: 36.7 C 37 C  SpO2: 99% 99%    Last Pain:  Vitals:   05/10/2021 1200  TempSrc: Axillary  PainSc:                  Ayren Zumbro

## 2021-05-17 NOTE — Anesthesia Preprocedure Evaluation (Signed)
Anesthesia Evaluation  Patient identified by MRN, date of birth, ID band Patient awake    Reviewed: Allergy & Precautions, NPO status , Patient's Chart, lab work & pertinent test results  Airway Mallampati: II  TM Distance: >3 FB     Dental   Pulmonary pneumonia, COPD, former smoker,    breath sounds clear to auscultation       Cardiovascular hypertension,  Rhythm:Regular Rate:Normal     Neuro/Psych    GI/Hepatic negative GI ROS, Neg liver ROS,   Endo/Other  negative endocrine ROS  Renal/GU Renal disease     Musculoskeletal   Abdominal   Peds  Hematology   Anesthesia Other Findings   Reproductive/Obstetrics                             Anesthesia Physical Anesthesia Plan  ASA: 3  Anesthesia Plan: General   Post-op Pain Management:    Induction: Intravenous  PONV Risk Score and Plan: 2 and Ondansetron, Dexamethasone and Midazolam  Airway Management Planned: Oral ETT  Additional Equipment:   Intra-op Plan:   Post-operative Plan: Possible Post-op intubation/ventilation  Informed Consent: I have reviewed the patients History and Physical, chart, labs and discussed the procedure including the risks, benefits and alternatives for the proposed anesthesia with the patient or authorized representative who has indicated his/her understanding and acceptance.     Dental advisory given  Plan Discussed with: CRNA and Anesthesiologist  Anesthesia Plan Comments:         Anesthesia Quick Evaluation

## 2021-05-17 NOTE — Progress Notes (Signed)
  Echocardiogram 2D Echocardiogram has been performed.  Christopher Henry 05/31/2021, 3:49 PM

## 2021-05-17 NOTE — H&P (View-Only) (Signed)
Referring Provider:  Irene Pap, DO         Primary Care Physician:  Bary Castilla, NP Primary Gastroenterologist: Althia Forts            Reason for Consultation:  Hematochezia                 ASSESSMENT /  PLAN    Hematochezia, occasional melena Anemia Weight loss Mediastinal mass Alcohol use Patient presents with hematochezia over the last 1.5 weeks and occasional melena prior to that.  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  Hemoglobin dropped from 12 on 04/15/21 to 6.9 upon presentation. Unclear whether he has a brisk upper GI bleed or lower GI bleed. No prior scopes. With normal platelet count, I am not concerned about variceal bleed despite his significant alcohol use history. He saturating well on room air this morning despite his CT findings of a mediastinal mass with some lung collapse and possible pneumonia. We will plan to proceed with EGD and colonoscopy to determine the source of his hematochezia and melena - Trend Hb - Check ferritin, iron/TIBC - PPI BID - Plan for EGD/colonoscopy tomorrow. CLD after his bronch. Moviprep this evening. NPO after MN except for prep.   HPI:     Christopher Henry is a 50 y.o. male with history of alcohol use, tobacco use, HTN, and anxiety presents with SOB, weakness, and rectal bleeding.  He states that for about a week and a half, he has had issues with shortness of breath, lightheadedness, generalized weakness, and rectal bleeding.  He first noted the rectal bleeding when he woke up and saw that his bed sheets have been soiled with blood.  He has continued to have rectal bleeding since then.  Describes the bleeding as both bright red and dark red.  He has had hemorrhoids since he was a teenager, but this is more substantial bleeding than he has seen previously.  Also states that he has occasionally seen melena.  Denies use of blood thinners.  He does take ibuprofen every day.  He drinks about 5 tall  beers per day and sometimes drinks hard liquor.  He smokes 1.5 packs/day and has for about the last 35 years.  He estimates that since he moved to [place] 20 years ago, he has lost over 40 pounds.  About 6 to 7 pounds of the weight loss was over the last 1.5 weeks.  Denies family history of GI cancers.  He has never had an EGD or colonoscopy in the past.  Denies dysphagia, odynophagia, abdominal pain.  He typically has loose stools when he drinks beers.  Denies constipation or diarrhea.  States that he typically eats 1-2 meals per day.   Past Medical History:  Diagnosis Date   Anxiety state, unspecified 11/09/2012   Hypertension     Past Surgical History:  Procedure Laterality Date   FINGER SURGERY     HIP SURGERY      Prior to Admission medications   Medication Sig Start Date End Date Taking? Authorizing Provider  amLODipine (NORVASC) 5 MG tablet Take 1 tablet (5 mg total) by mouth daily. 03/27/21  Yes Ghumman, Ramandeep, NP  betamethasone dipropionate (DIPROLENE) 0.05 % ointment Apply topically 2 (two) times daily. 03/27/21  Yes Ghumman, Ramandeep, NP  Budeson-Glycopyrrol-Formoterol (BREZTRI AEROSPHERE) 160-9-4.8 MCG/ACT AERO Inhale 160 mcg into the lungs daily. Patient taking differently: Inhale 160 mcg into the lungs in the morning and at bedtime. 04/24/21  Yes Ghumman, Ramandeep, NP  IBUPROFEN PO Take 1 tablet by mouth daily as needed (headache, pain).   Yes [provider]    Current Facility-Administered Medications  Medication Dose Route Frequency Provider Last Rate Last Admin   [MAR Hold] 0.9 %  sodium chloride infusion  10 mL/hr Intravenous Once Coralee Pesa, MD       [facility] Hold] acetaminophen (TYLENOL) tablet 650 mg  650 mg Oral Q6H PRN Shela Leff, MD   650 mg at 05/18/2021 0244   [MAR Hold] albuterol (PROVENTIL) (2.5 MG/3ML) 0.083% nebulizer solution 2.5 mg  2.5 mg Nebulization Q4H PRN Orma Flaming, MD       [facility] Hold] azithromycin (ZITHROMAX) 500 mg  in sodium chloride 0.9 % 250 mL IVPB  500 mg Intravenous Q24H Orma Flaming, MD 250 mL/hr at 05/09/2021 2137 500 mg at 05/06/2021 2137   [MAR Hold] cefTRIAXone (ROCEPHIN) 1 g in sodium chloride 0.9 % 100 mL IVPB  1 g Intravenous Q24H Orma Flaming, MD 200 mL/hr at 05/11/2021 2140 1 g at 06/01/2021 6222   [MAR Hold] folic acid (FOLVITE) tablet 1 mg  1 mg Oral Daily Irene Pap N, DO       lactated ringers infusion   Intravenous Continuous Orma Flaming, MD 75 mL/hr at 05/18/2021 2135 New Bag at 05/14/2021 2135   [MAR Hold] LORazepam (ATIVAN) tablet 1-4 mg  1-4 mg Oral Q1H PRN Kayleen Memos, DO       Or   [MAR Hold] LORazepam (ATIVAN) injection 1-4 mg  1-4 mg Intravenous Q1H PRN Hall, Carole N, DO       [MAR Hold] mometasone-formoterol (DULERA) 200-5 MCG/ACT inhaler 2 puff  2 puff Inhalation BID Orma Flaming, MD   2 puff at 05/03/2021 0847   And   [MAR Hold] umeclidinium bromide (INCRUSE ELLIPTA) 62.5 MCG/INH 1 puff  1 puff Inhalation Daily Orma Flaming, MD       [facility] Hold] multivitamin with minerals tablet 1 tablet  1 tablet Oral Daily Kayleen Memos, DO       [MAR Hold] thiamine tablet 100 mg  100 mg Oral Daily Hall, Carole N, DO       Or   [MAR Hold] thiamine (B-1) injection 100 mg  100 mg Intravenous Daily Irene Pap N, DO        Allergies as of 05/24/2021   (No Known Allergies)    Family History  Problem Relation Age of Onset   Hypertension Brother    Cancer Maternal Grandmother        unknown to pt    Social History   Tobacco Use   Smoking status: Former    Packs/day: 2.00    Years: 35.00    Pack years: 70.00    Types: Cigarettes    Quit date: 05/12/2021    Years since quitting: 0.0   Smokeless tobacco: Never  Substance Use Topics   Alcohol use: Yes    Comment: 1 pint a day   Drug use: No    Review of Systems: All systems reviewed and negative except where noted in HPI.  Physical Exam: Vital signs in last 24 hours: Temp:  [98 F (36.7 C)-100.7 F (38.2 C)] 98.7 F  (37.1 C) (10/15 0914) Pulse Rate:  [54-134] 119 (10/15 0914) Resp:  [16-29] 21 (10/15 0914) BP: (92-152)/(62-100) 111/62 (10/15 0914) SpO2:  [80 %-100 %] 94 % (10/15 0914) Weight:  [59.4 kg-61.3 kg] 61.3 kg (10/14 1948) Last BM Date: 05/13/21 General:  Awake, alert, NAD, cachectic appearing Psych:  Pleasant, cooperative. Normal mood and affect. Eyes:  Pupils equal, sclera clear, no icterus.    Neck:  Supple; no masses Lungs:  Clear anteriorly Heart:  Regular rate and rhythm; no murmurs Abdomen:  Soft, non-distended, mildly tender in the LLQ  Extremities: No BLE Neurologic:  Alert. Some shaking of his hands when raised. Skin:  Intact without significant lesions or rashes.   Intake/Output from previous day: 10/14 0701 - 10/15 0700 In: 1496.5 [I.V.:381.5; Blood:665; IV Piggyback:450] Out: -  Intake/Output this shift: No intake/output data recorded.  Lab Results: Recent Labs    05/28/2021 1341 05/18/2021 2050 05/03/2021 0014 05/12/2021 0743  WBC 30.8*  --   --  25.3*  HGB 6.9* 7.2* 6.6* 7.8*  HCT 21.6* 22.1* 20.2* 23.0*  PLT 534*  --   --  393   BMET Recent Labs    05/23/2021 1341 05/07/2021 1604  NA 133* 134*  K 3.9 4.2  CL 101 101  CO2 20* 19*  GLUCOSE 147* 111*  BUN 29* 31*  CREATININE 1.60* 1.58*  CALCIUM 9.0 8.8*   LFT Recent Labs    05/08/2021 1604  PROT 7.6  ALBUMIN 3.3*  AST 15  ALT 8  ALKPHOS 61  BILITOT 0.5   PT/INR Recent Labs    05/25/2021 1604  LABPROT 16.9*  INR 1.4*   Hepatitis Panel No results for input(s): HEPBSAG, HCVAB, HEPAIGM, HEPBIGM in the last 72 hours.   . CBC Latest Ref Rng & Units 05/30/2021 05/15/2021 05/07/2021  WBC 4.0 - 10.5 K/uL 25.3(H) - -  Hemoglobin 13.0 - 17.0 g/dL 7.8(L) 6.6(LL) 7.2(L)  Hematocrit 39.0 - 52.0 % 23.0(L) 20.2(L) 22.1(L)  Platelets 150 - 400 K/uL 393 - -    . CMP Latest Ref Rng & Units 05/13/2021 05/09/2021 04/15/2021  Glucose 70 - 99 mg/dL 111(H) 147(H) 87  BUN 6 - 20 mg/dL 31(H) 29(H) 14   Creatinine 0.61 - 1.24 mg/dL 1.58(H) 1.60(H) 0.84  Sodium 135 - 145 mmol/L 134(L) 133(L) 136  Potassium 3.5 - 5.1 mmol/L 4.2 3.9 4.1  Chloride 98 - 111 mmol/L 101 101 102  CO2 22 - 32 mmol/L 19(L) 20(L) 22  Calcium 8.9 - 10.3 mg/dL 8.8(L) 9.0 9.7  Total Protein 6.5 - 8.1 g/dL 7.6 - -  Total Bilirubin 0.3 - 1.2 mg/dL 0.5 - -  Alkaline Phos 38 - 126 U/L 61 - -  AST 15 - 41 U/L 15 - -  ALT 0 - 44 U/L 8 - -   Studies/Results: DG Chest 2 View  Result Date: 05/05/2021 CLINICAL DATA:  Shortness of breath EXAM: CHEST - 2 VIEW COMPARISON:  Radiograph 04/15/2021 FINDINGS: Unchanged cardiomediastinal silhouette. New right lower lobe airspace disease. No large pleural effusion or visible pneumothorax. There is prominence of the azygous vein and right paratracheal stripe. There is an unchanged compression deformity in midthoracic spine. No acute osseous abnormality. IMPRESSION: Right lower lobe pneumonia. Recommend follow-up chest radiograph in 6-12 weeks to ensure resolution. Electronically Signed   By: Maurine Simmering M.D.   On: 05/06/2021 14:21   CT Angio Chest PE W and/or Wo Contrast  Result Date: 05/10/2021 CLINICAL DATA:  Pt here via EMS with c/o SOB with exertion. History of chronic bronchitis. Pt at PCP with SpO2 of 80. Placed on 2 liters with improvement of 100%. Chest tingling. Pt reports rectal bleeding X1 week EXAM: CT ANGIOGRAPHY CHEST WITH CONTRAST TECHNIQUE: Multidetector CT imaging of the chest was performed using the  standard protocol during bolus administration of intravenous contrast. Multiplanar CT image reconstructions and MIPs were obtained to evaluate the vascular anatomy. CONTRAST:  67mL OMNIPAQUE IOHEXOL 350 MG/ML SOLN COMPARISON:  Chest x-ray 05/23/2021, chest x-ray 04/15/2021 FINDINGS: Cardiovascular: Satisfactory opacification of the pulmonary arteries to the segmental level. No evidence of pulmonary embolism. The main pulmonary artery is normal in caliber. There is narrowing but no  definite significant stenosis of the right main pulmonary artery due to external mass effect. Normal heart size. Small to moderate volume pericardial effusion. Left-to-right midline shift. Mediastinum/Nodes: Infiltrative mass of the mediastinum with markedly limited evaluation of its borders due to timing of contrast: Measures up to at least 5.8 cm on axial imaging (10:229). Mediastinal lymphadenopathy with as an example a 2.7 cm right paratracheal lymph node (2:58). No definite left hilar lymphadenopathy. No axillary nodes. Question extension of the mass into the carina (11:61). Thyroid gland demonstrate no significant findings. The esophagus is not well visualized due to abutment to the mass. Lungs/Pleura: Complete collapse of the right lower lobe with associated endobronchial debris/lesion. A 1.7 cm ground-glass airspace opacity at the right apex. Associated vague peripheral ground-glass airspace opacities within the right upper lobe. At least mild to moderate paraseptal and centrilobular emphysematous changes. No focal consolidation, pulmonary nodule, mass of the left lung. No left pleural effusion. No right pleural effusion. No pneumothorax. Upper Abdomen: No acute abnormality. Musculoskeletal: Minimally displaced subacute to chronic fracture of the mid sternal body. Associated underlying cirrhosis may represent pathologic fracture versus healing. Otherwise no suspicious lytic or blastic osseous lesions. No acute displaced fracture. Multilevel degenerative changes of the spine. Slight vertebral body height loss anteriorly of the T9 vertebral body. Review of the MIP images confirms the above findings. IMPRESSION: 1. No pulmonary embolus; however, mild narrowing with no significant stenosis of the right main pulmonary artery due to external mass effect from the mass described below. 2. Infiltrative mass of the mediastinum with markedly limited evaluation of its borders due to timing of contrast. Associated  complete collapse of the right lower lobe with associated complete right lower lobe bronchi occlusion due to endobronchial debris/lesion. Question extension of the mass into the carina.Associated left-to-right midline shift due to decreased lung volume. Concern for underlying malignancy - likely of the mediastinum with extent into or around the right mainstem bronchi leading to collapse the right lower lobe. Additional imaging evaluation or consultation with Pulmonology or Thoracic Surgery recommended. 3. Mediastinal lymphadenopathy. 4. A 1.7 cm ground-glass airspace opacity at the right apex. Associated vague peripheral ground-glass airspace opacities within the right upper lobe. 5. Small to moderate volume pericardial effusion. 6. Subacute to chronic fracture of the mid sternal body. Underlying sclerosis likely due to healing; however, pathologic fracture is not fully excluded. 7. Emphysema (ICD10-J43.9). Electronically Signed   By: Iven Finn M.D.   On: 05/15/2021 18:12    Active Problems:   Acute hypoxemic respiratory failure ([name])   Symptomatic anemia   Mediastinal mass   AKI (acute kidney injury) ([place])   BRBPR (bright red blood per rectum)   Sepsis due to pneumonia ([place])   COPD (chronic obstructive pulmonary disease) ([name])    Christia Reading, M.D. @  05/26/2021, 9:23 AM

## 2021-05-17 NOTE — Progress Notes (Signed)
Patient continues to refuse help with peri care and personal hygiene.

## 2021-05-17 NOTE — Anesthesia Procedure Notes (Signed)
Procedure Name: Intubation Date/Time: 05/11/2021 9:59 AM Performed by: Eligha Bridegroom, CRNA Pre-anesthesia Checklist: Patient identified, Emergency Drugs available, Suction available, Patient being monitored and Timeout performed Patient Re-evaluated:Patient Re-evaluated prior to induction Oxygen Delivery Method: Circle system utilized Preoxygenation: Pre-oxygenation with 100% oxygen Induction Type: Rapid sequence Ventilation: Mask ventilation without difficulty Laryngoscope Size: Mac and 4 Grade View: Grade I Tube type: Oral Tube size: 8.5 mm Number of attempts: 1 Airway Equipment and Method: Stylet Placement Confirmation: ETT inserted through vocal cords under direct vision, positive ETCO2 and breath sounds checked- equal and bilateral Secured at: 23 cm Tube secured with: Tape Dental Injury: Teeth and Oropharynx as per pre-operative assessment

## 2021-05-17 NOTE — Progress Notes (Signed)
Name: Christopher Henry MRN: [id number] DOB: [birth year]    ADMISSION DATE:  06/01/2021 CONSULTATION DATE:  05/22/2021   REFERRING MD :  Tegeler, EDP   CHIEF COMPLAINT: Hemoptysis, bright red blood per rectum   HISTORY OF PRESENT ILLNESS: 50 year old heavy smoker with COPD, seen by my partner Dr. Loanne Drilling in the pulmonary office and sent to the emergency room.  He reports hemoptysis for the past 6 days, increase shortness of breath and wheezing, unable to ambulate short distances.  He was noted to be bradycardic in the 50s in the office with saturation 80% on room air, placed on 3 L nasal cannula and referred to the emergency room.  CT angiogram chest showed no PE but showed mediastinal mass infiltrating into the carina with mediastinal lymphadenopathy including a 2.7 cm right paratracheal lymph node.  There was stenosis of the right main pulmonary artery there was a 1.7 cm groundglass nodule at the right apex and there was complete collapse of the right lower lobe.  There was a small volume pericardial effusion On further questioning, he reports shortness of breath ongoing for 3 months, 50 pound weight loss over the past 2 years. He also reported bright red blood per rectum, painless and sometimes without his knowledge which is not characteristic of his known hemorrhoids   Labs in the ED showed mild hyponatremia, leukocytosis 30 1K, hemoglobin of 6.9   PAST MEDICAL HISTORY :  Past Medical History:  Diagnosis Date   Anxiety state, unspecified 11/09/2012   Hypertension    SUBJECTIVE:  Low-grade fever 100 overnight On room air, denies chest pain. No further episodes of hemoptysis. Transfused 2 units of PRBCs , last hemoglobin 6.6, repeat pending  VITAL SIGNS: Temp:  [98 F (36.7 C)-100.7 F (38.2 C)] 98.7 F (37.1 C) (10/15 0756) Pulse Rate:  [54-134] 106 (10/15 0756) Resp:  [16-29] 20 (10/15 0756) BP: (92-152)/(63-100) 108/63 (10/15 0756) SpO2:  [80 %-100 %] 100 % (10/15  0756) Weight:  [59.4 kg-61.3 kg] 61.3 kg (10/14 1948)  PHYSICAL EXAMINATION: Gen. thin man, in no distress, normal affect, able to lie supine ENT - no lesions, no post nasal drip, pallor +, no icterus Neck: No JVD, no thyromegaly, no carotid bruits Lungs: No accessory muscle use, decreased breath sounds right base Cardiovascular: S1-S2 regular, no murmur no edema Abdomen: soft and non-tender, no hepatosplenomegaly, BS normal. Musculoskeletal: No deformities, no cyanosis , clubbing2+ Neuro:  alert, non focal Skin:  Warm, no lesions/ rash   Recent Labs  Lab 05/04/2021 1341 05/07/2021 1604  NA 133* 134*  K 3.9 4.2  CL 101 101  CO2 20* 19*  BUN 29* 31*  CREATININE 1.60* 1.58*  GLUCOSE 147* 111*    Recent Labs  Lab 05/07/2021 1341 05/23/2021 2050 05/31/2021 0014  HGB 6.9* 7.2* 6.6*  HCT 21.6* 22.1* 20.2*  WBC 30.8*  --   --   PLT 534*  --   --     DG Chest 2 View  Result Date: 05/18/2021 CLINICAL DATA:  Shortness of breath EXAM: CHEST - 2 VIEW COMPARISON:  Radiograph 04/15/2021 FINDINGS: Unchanged cardiomediastinal silhouette. New right lower lobe airspace disease. No large pleural effusion or visible pneumothorax. There is prominence of the azygous vein and right paratracheal stripe. There is an unchanged compression deformity in midthoracic spine. No acute osseous abnormality. IMPRESSION: Right lower lobe pneumonia. Recommend follow-up chest radiograph in 6-12 weeks to ensure resolution. Electronically Signed   By: Maurine Simmering M.D.   On: 05/08/2021 14:21  CT Angio Chest PE W and/or Wo Contrast  Result Date: 05/15/2021 CLINICAL DATA:  Pt here via EMS with c/o SOB with exertion. History of chronic bronchitis. Pt at PCP with SpO2 of 80. Placed on 2 liters with improvement of 100%. Chest tingling. Pt reports rectal bleeding X1 week EXAM: CT ANGIOGRAPHY CHEST WITH CONTRAST TECHNIQUE: Multidetector CT imaging of the chest was performed using the standard protocol during bolus  administration of intravenous contrast. Multiplanar CT image reconstructions and MIPs were obtained to evaluate the vascular anatomy. CONTRAST:  32mL OMNIPAQUE IOHEXOL 350 MG/ML SOLN COMPARISON:  Chest x-ray 05/21/2021, chest x-ray 04/15/2021 FINDINGS: Cardiovascular: Satisfactory opacification of the pulmonary arteries to the segmental level. No evidence of pulmonary embolism. The main pulmonary artery is normal in caliber. There is narrowing but no definite significant stenosis of the right main pulmonary artery due to external mass effect. Normal heart size. Small to moderate volume pericardial effusion. Left-to-right midline shift. Mediastinum/Nodes: Infiltrative mass of the mediastinum with markedly limited evaluation of its borders due to timing of contrast: Measures up to at least 5.8 cm on axial imaging (10:229). Mediastinal lymphadenopathy with as an example a 2.7 cm right paratracheal lymph node (2:58). No definite left hilar lymphadenopathy. No axillary nodes. Question extension of the mass into the carina (11:61). Thyroid gland demonstrate no significant findings. The esophagus is not well visualized due to abutment to the mass. Lungs/Pleura: Complete collapse of the right lower lobe with associated endobronchial debris/lesion. A 1.7 cm ground-glass airspace opacity at the right apex. Associated vague peripheral ground-glass airspace opacities within the right upper lobe. At least mild to moderate paraseptal and centrilobular emphysematous changes. No focal consolidation, pulmonary nodule, mass of the left lung. No left pleural effusion. No right pleural effusion. No pneumothorax. Upper Abdomen: No acute abnormality. Musculoskeletal: Minimally displaced subacute to chronic fracture of the mid sternal body. Associated underlying cirrhosis may represent pathologic fracture versus healing. Otherwise no suspicious lytic or blastic osseous lesions. No acute displaced fracture. Multilevel degenerative changes  of the spine. Slight vertebral body height loss anteriorly of the T9 vertebral body. Review of the MIP images confirms the above findings. IMPRESSION: 1. No pulmonary embolus; however, mild narrowing with no significant stenosis of the right main pulmonary artery due to external mass effect from the mass described below. 2. Infiltrative mass of the mediastinum with markedly limited evaluation of its borders due to timing of contrast. Associated complete collapse of the right lower lobe with associated complete right lower lobe bronchi occlusion due to endobronchial debris/lesion. Question extension of the mass into the carina.Associated left-to-right midline shift due to decreased lung volume. Concern for underlying malignancy - likely of the mediastinum with extent into or around the right mainstem bronchi leading to collapse the right lower lobe. Additional imaging evaluation or consultation with Pulmonology or Thoracic Surgery recommended. 3. Mediastinal lymphadenopathy. 4. A 1.7 cm ground-glass airspace opacity at the right apex. Associated vague peripheral ground-glass airspace opacities within the right upper lobe. 5. Small to moderate volume pericardial effusion. 6. Subacute to chronic fracture of the mid sternal body. Underlying sclerosis likely due to healing; however, pathologic fracture is not fully excluded. 7. Emphysema (ICD10-J43.9). Electronically Signed   By: Iven Finn M.D.   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  I am concerned about carinal extension which is likely causing his incessant coughing and hemoptysis in which has the potential to cause complete airway obstruction.    -Discussed risks and benefits of  bronchoscopy including potential for bleeding and respiratory failure requiring mechanical ventilation, he is agreeable to proceed -Based on results, will call radiation oncology  Pericardial effusion -Obtain echocardiogram  Postobstructive/right lower lobe pneumonia -Treat with empiric antibiotics  Acute/chronic hypoxic respiratory failure -oxygen as needed -Bronchodilators for COPD/emphysema  Bleeding per rectum -May need GI evaluation, await repeat hemoglobin after second unit PRBC   Kara Mead MD. FCCP. [facility] Pager [phone] If no response call [phone]    05/31/2021, 8:02 AM

## 2021-05-18 ENCOUNTER — Encounter (HOSPITAL_COMMUNITY): Payer: Self-pay | Admitting: Family Medicine

## 2021-05-18 DIAGNOSIS — I213 ST elevation (STEMI) myocardial infarction of unspecified site: Secondary | ICD-10-CM

## 2021-05-18 DIAGNOSIS — R000 Tachycardia, unspecified: Secondary | ICD-10-CM

## 2021-05-18 DIAGNOSIS — I3131 Malignant pericardial effusion in diseases classified elsewhere: Secondary | ICD-10-CM

## 2021-05-18 DIAGNOSIS — D62 Acute posthemorrhagic anemia: Secondary | ICD-10-CM | POA: Diagnosis not present

## 2021-05-18 DIAGNOSIS — I319 Disease of pericardium, unspecified: Secondary | ICD-10-CM

## 2021-05-18 DIAGNOSIS — D649 Anemia, unspecified: Secondary | ICD-10-CM

## 2021-05-18 DIAGNOSIS — F1090 Alcohol use, unspecified, uncomplicated: Secondary | ICD-10-CM

## 2021-05-18 DIAGNOSIS — J9859 Other diseases of mediastinum, not elsewhere classified: Secondary | ICD-10-CM | POA: Diagnosis not present

## 2021-05-18 DIAGNOSIS — J9601 Acute respiratory failure with hypoxia: Secondary | ICD-10-CM | POA: Diagnosis not present

## 2021-05-18 DIAGNOSIS — R918 Other nonspecific abnormal finding of lung field: Secondary | ICD-10-CM | POA: Diagnosis not present

## 2021-05-18 DIAGNOSIS — K921 Melena: Secondary | ICD-10-CM

## 2021-05-18 LAB — BASIC METABOLIC PANEL
Anion gap: 9 (ref 5–15)
BUN: 20 mg/dL (ref 6–20)
CO2: 20 mmol/L — ABNORMAL LOW (ref 22–32)
Calcium: 8.1 mg/dL — ABNORMAL LOW (ref 8.9–10.3)
Chloride: 105 mmol/L (ref 98–111)
Creatinine, Ser: 1.29 mg/dL — ABNORMAL HIGH (ref 0.61–1.24)
GFR, Estimated: >60 mL/min (ref >60)
Glucose, Bld: 107 mg/dL — ABNORMAL HIGH (ref 70–99)
Potassium: 4 mmol/L (ref 3.5–5.1)
Sodium: 134 mmol/L — ABNORMAL LOW (ref 135–145)

## 2021-05-18 LAB — IRON AND TIBC
Iron: 10 ug/dL — ABNORMAL LOW (ref 45–182)
Saturation Ratios: 4 % — ABNORMAL LOW (ref 17.9–39.5)
TIBC: 280 ug/dL (ref 250–450)
UIBC: 270 ug/dL

## 2021-05-18 LAB — CBC
HCT: 20.9 % — ABNORMAL LOW (ref 39.0–52.0)
Hemoglobin: 6.8 g/dL — CL (ref 13.0–17.0)
MCH: 25.5 pg — ABNORMAL LOW (ref 26.0–34.0)
MCHC: 32.5 g/dL (ref 30.0–36.0)
MCV: 78.3 fL — ABNORMAL LOW (ref 80.0–100.0)
Platelets: 389 10*3/uL (ref 150–400)
RBC: 2.67 MIL/uL — ABNORMAL LOW (ref 4.22–5.81)
RDW: 18.1 % — ABNORMAL HIGH (ref 11.5–15.5)
WBC: 25.5 10*3/uL — ABNORMAL HIGH (ref 4.0–10.5)
nRBC: 0.2 % (ref 0.0–0.2)

## 2021-05-18 LAB — PROCALCITONIN: Procalcitonin: 0.25 ng/mL

## 2021-05-18 LAB — PREPARE RBC (CROSSMATCH)

## 2021-05-18 LAB — RETICULOCYTES
Immature Retic Fract: 37.1 % — ABNORMAL HIGH (ref 2.3–15.9)
RBC.: 2.63 MIL/uL — ABNORMAL LOW (ref 4.22–5.81)
Retic Count, Absolute: 79.2 10*3/uL (ref 19.0–186.0)
Retic Ct Pct: 3 % (ref 0.4–3.1)

## 2021-05-18 LAB — HEMOGLOBIN AND HEMATOCRIT, BLOOD
HCT: 24.2 % — ABNORMAL LOW (ref 39.0–52.0)
HCT: 24.2 % — ABNORMAL LOW (ref 39.0–52.0)
HCT: 23.7 % — ABNORMAL LOW (ref 39.0–52.0)
Hemoglobin: 7.8 g/dL — ABNORMAL LOW (ref 13.0–17.0)
Hemoglobin: 7.9 g/dL — ABNORMAL LOW (ref 13.0–17.0)
Hemoglobin: 7.9 g/dL — ABNORMAL LOW (ref 13.0–17.0)

## 2021-05-18 LAB — FERRITIN: Ferritin: 74 ng/mL (ref 24–336)

## 2021-05-18 MED ORDER — PEG 3350-KCL-NABCB-NACL-NASULF 236 G PO SOLR
4000.0000 mL | Freq: Once | ORAL | Status: DC
  Filled 2021-05-18: qty 4000

## 2021-05-18 MED ORDER — PEG-KCL-NACL-NASULF-NA ASC-C 100 G PO SOLR: 0.5000 | Freq: Once | ORAL | Status: DC

## 2021-05-18 MED ORDER — SODIUM CHLORIDE 0.9% IV SOLUTION: Freq: Once | INTRAVENOUS | Status: AC

## 2021-05-18 MED ORDER — ACETAMINOPHEN 160 MG/5ML PO SOLN
650.0000 mg | Freq: Four times a day (QID) | ORAL | Status: DC | PRN
  Filled 2021-05-18: qty 20.3

## 2021-05-18 MED ORDER — PEG-KCL-NACL-NASULF-NA ASC-C 100 G PO SOLR
0.5000 | Freq: Once | ORAL | Status: AC
  Administered 2021-05-18: 100 g via ORAL
  Filled 2021-05-18: qty 1

## 2021-05-18 MED ORDER — PEG-KCL-NACL-NASULF-NA ASC-C 100 G PO SOLR
0.5000 | Freq: Once | ORAL | Status: AC
  Administered 2021-05-19: 100 g via ORAL

## 2021-05-18 MED ORDER — PROSOURCE PLUS PO LIQD
30.0000 mL | Freq: Two times a day (BID) | ORAL | Status: DC
  Administered 2021-05-20 – 2021-06-06 (×21): 30 mL via ORAL
  Filled 2021-05-18 (×25): qty 30

## 2021-05-18 MED ORDER — BOOST / RESOURCE BREEZE PO LIQD CUSTOM
1.0000 | Freq: Three times a day (TID) | ORAL | Status: DC
  Administered 2021-05-18 – 2021-05-21 (×4): 1 via ORAL

## 2021-05-18 NOTE — Progress Notes (Signed)
Initial Nutrition Assessment  DOCUMENTATION CODES:   Underweight  INTERVENTION:  -Boost Breeze po TID, each supplement provides 250 kcal and 9 grams of protein -PROSource PLUS PO 61mls BID, each supplement provides 100 kcals and 15 grams of protein -Continue MVI with minerals daily  NUTRITION DIAGNOSIS:   Inadequate oral intake related to poor appetite as evidenced by per patient/family report.  GOAL:   Patient will meet greater than or equal to 90% of their needs  MONITOR:   PO intake, Supplement acceptance, Diet advancement, Weight trends, Labs, I & O's  REASON FOR ASSESSMENT:   Consult Poor PO  ASSESSMENT:   Pt with PMH significant for EtOH abuse, tobacco use, HTN, and anxiety admitted with hematochezia, occasional melena, anemia, weight loss, and mediastinal mass concerning for malignancy (biopsied via bronchoscopy, results pending).  GI is following. Pt was supposed to have EGD/colonoscopy today but was only able to complete about 1/4 of his prep. Pt willing to drink more prep, so GI rescheduled procedures for tomorrow.  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   Pt currently having echo and unable to provide history at this time. Per H&P, pt reported poor po intake for 5 days PTA.   He states he was on vacation last week in [place] and started to have light headedness and dizziness. On Sunday he started to get short of breath with progressive weakness and fatigue starting on Tuesday that has continued to get worse.  He states he has had hot flashes and a 50 pound+ weight loss over the last year or more. Suspect pt meets criteria for malnutrition; however, unable to diagnose at this time without detailed diet history and/or nutrition-focused physical exam. Pt is underweight.   No PO Intake documented.    UOP: 1x unmeasured occurrence documented x24 hours I/O: +3168ml since  admit  Medications: folvite, MVI with minerals, protonix, thiamine, IV abx Labs: Recent Labs  Lab 05/12/2021 1604 05/14/2021 0743 05/18/21 0221  NA 134* 136 134*  K 4.2 4.0 4.0  CL 101 105 105  CO2 19* 19* 20*  BUN 31* 27* 20  CREATININE 1.58* 1.22 1.29*  CALCIUM 8.8* 8.4* 8.1*  GLUCOSE 111* 104* 107*  Iron 10 (L), hgb 6.8 (L)  NUTRITION - FOCUSED PHYSICAL EXAM: Unable to perform at this time. Will attempt at follow-up.  Diet Order:   Diet Order             Diet NPO time specified  Diet effective midnight           Diet clear liquid Room service appropriate? Yes; Fluid consistency: Thin  Diet effective now                   EDUCATION NEEDS:   No education needs have been identified at this time  Skin:  Skin Assessment: Reviewed RN Assessment  Last BM:  10/16  Height:   Ht Readings from Last 1 Encounters:  05/15/2021 6\' 4"  (1.93 m)    Weight:   Wt Readings from Last 10 Encounters:  05/26/2021 61.3 kg  06/01/2021 59.4 kg  04/24/21 60.3 kg  04/15/21 72.6 kg  03/27/21 63 kg  10/24/19 72.6 kg  11/09/12 68.5 kg     BMI:  Body mass index is 16.45 kg/m.  Estimated Nutritional Needs:   Kcal:  2150-2350  Protein:  105-120 grams  Fluid:  >2L/d    Larkin Ina, MS, RD,  LDN (she/her/hers) RD pager number and weekend/on-call pager number located in [place].

## 2021-05-18 NOTE — Consult Note (Signed)
Cardiology Consultation:   Patient ID: Dona Klemann MRN: [id number]; DOB: Jan 27, 1971  Admit date: 06/02/2021 Date of Consult: 05/18/2021  PCP:  Bary Castilla, NP   [facility] Providers Cardiologist:  Evalina Field, MD - new   Patient Profile:   Karandeep Resende is a 50 y.o. male with a hx of tobacco abuse, COPD, HTN, heavy alcohol use, and anxiety who is being seen 05/18/2021 for the evaluation of pericardial effusion in the setting of suspected metastatic lung cancer at the request of Dr. Lupita Leash.  History of Present Illness:   Mr. Crochet has a significant history of  35+ pack year history and COPD.   He recounts pulmonary issues started in July 2022 when he was diagnosed with bronchitis and treated with ABX and steroids. CE negative. He was seen back in the ER 03/15/21 for chest pain felt to be costochondritis, he was discharged without admission. CE again negative. He was seen 04/15/10 for COPD exacerbation, CE not obtained. COPD exacerbation treated with steroids, ABX, and albuterol. He presented to pulmonology office on 05/14/2021 to establish care for COPD. He was unable to ambulate short distances and was hypoxic to 80%, improved with 3L [place]. EMS transported to [facility]. He reported dyspnea that started last week and fatigue starting Tues. He had a dry cough and tightness in his chest. He also reports greater than 50lb weight loss over the last year. He reported BRBPR that started last Wed and hemoptysis for the prior 6 days.  Workup included: Hb of 6.9 WBC 30.8 D-dimer 3.52 Lactic acid 2.4 sCr 1.58 HS troponin 49 --> 8  CTA was negative for PE but did show an infiltrated mass of the mediastinum with extension into the carina, complete collapse of RLL, left to right midline shift due to decreased lung volume, overall concern for malignancy.   He was transfused and admitted to medicine service. 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 Carinal extension is concerning for possible airway obstruction. Radiology oncology was recommended with bronchoscopy for definitve diagnosis.   GI was consulted for melena and recommended colonoscopy with intubation. Pt did not have adequate bowel prep yesterday.   Echo was obtained and showed large circumferential pericardial effusion with RA diastolic collapse, no RV collapse.   Given newly recognized cancer, CT surgery was consulted for pericardial window. [name] recommended pericardiocentesis first. Cardiology was consulted.   During my interview, he describes chest burning worse with position and coughing. He denies prior cardiac history and family history of cardiac issues. Symptoms started in July and have worsened over the past few weeks.   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 He has had some chest pain when bending over to pick up heavy boxes.   He is a smoker and drinks alcohol daily - 5 beers on work nights and 1.5 pints of liquor when he doesn't have to work the next day.   He has four children ages 57-34. He denies cardiovascular disease in his parents and 4 siblings.     Past Medical History:  Diagnosis Date   Anxiety state, unspecified 11/09/2012   Hypertension     Past Surgical History:  Procedure Laterality Date   FINGER SURGERY     HIP SURGERY       Home Medications:  Prior to Admission medications  Medication Sig Start Date End Date Taking? Authorizing Provider  amLODipine (NORVASC) 5 MG tablet Take 1 tablet (5 mg total) by mouth daily. 03/27/21  Yes Ghumman, Ramandeep, NP  betamethasone dipropionate (DIPROLENE) 0.05 % ointment Apply topically 2 (two) times daily. 03/27/21  Yes Ghumman, Ramandeep, NP  Budeson-Glycopyrrol-Formoterol (BREZTRI AEROSPHERE) 160-9-4.8 MCG/ACT AERO Inhale 160 mcg into the lungs daily. Patient taking differently:  Inhale 160 mcg into the lungs in the morning and at bedtime. 04/24/21  Yes Ghumman, Ramandeep, NP  IBUPROFEN PO Take 1 tablet by mouth daily as needed (headache, pain).   Yes [provider]    Inpatient Medications: Scheduled Meds:  (feeding supplement) PROSource Plus  30 mL Oral BID BM   feeding supplement  1 Container Oral TID BM   folic acid  1 mg Oral Daily   mometasone-formoterol  2 puff Inhalation BID   And   umeclidinium bromide  1 puff Inhalation Daily   multivitamin with minerals  1 tablet Oral Daily   pantoprazole  40 mg Oral BID   peg 3350 powder  0.5 kit Oral Once   And   [START ON 05/26/2021] peg 3350 powder  0.5 kit Oral Once   thiamine  100 mg Oral Daily   Or   thiamine  100 mg Intravenous Daily   Continuous Infusions:  sodium chloride     azithromycin 500 mg (05/08/2021 2159)   cefTRIAXone (ROCEPHIN)  IV 1 g (06/02/2021 2342)   lactated ringers 75 mL/hr at 05/15/2021 0615   PRN Meds: acetaminophen, albuterol, LORazepam **OR** LORazepam  Allergies:   No Known Allergies  Social History:   Social History   Socioeconomic History   Marital status: Soil scientist    Spouse name: Not on file   Number of children: Not on file   Years of education: Not on file   Highest education level: Not on file  Occupational History   Not on file  Tobacco Use   Smoking status: Former    Packs/day: 2.00    Years: 35.00    Pack years: 70.00    Types: Cigarettes    Quit date: 05/12/2021    Years since quitting: 0.0   Smokeless tobacco: Never  Substance and Sexual Activity   Alcohol use: Yes    Comment: 1 pint a day   Drug use: No   Sexual activity: Not on file  Other Topics Concern   Not on file  Social History Narrative   Not on file   Social Determinants of Health   Financial Resource Strain: Not on file  Food Insecurity: Not on file  Transportation Needs: Not on file  Physical Activity: Not on file  Stress: Not on file  Social Connections: Not on  file  Intimate Partner Violence: Not on file    Family History:    Family History  Problem Relation Age of Onset   Hypertension Brother    Cancer Maternal Grandmother        unknown to pt     ROS:  Please see the history of present illness.   All other ROS reviewed and negative.     Physical Exam/Data:   Vitals:   05/18/21 0445 05/18/21 0745 05/18/21 0905 05/18/21 0906  BP: 136/86 124/88    Pulse: (!) 119 (!) 107    Resp: 20 (!) 23    Temp:  98.4 F (36.9 C)    TempSrc:  Oral    SpO2: 95% 95% 97% 97%  Weight:  Intake/Output Summary (Last 24 hours) at 05/18/2021 1225 Last data filed at 05/18/2021 0745 Gross per 24 hour  Intake 1142.19 ml  Output --  Net 1142.19 ml   Last 3 Weights 05/21/2021 05/30/2021 04/24/2021  Weight (lbs) 135 lb 2.3 oz 131 lb 133 lb  Weight (kg) 61.3 kg 59.421 kg 60.328 kg     Body mass index is 16.45 kg/m.  General:  tall thin male in NAD HEENT: normal Neck: no JVD Vascular: No carotid bruits; Distal pulses 2+ bilaterally Cardiac:  normal S1, S2; RRR; rub noted Lungs:  clear to auscultation bilaterally, no wheezing, rhonchi or rales  Abd: soft, nontender, no hepatomegaly  Ext: no edema Musculoskeletal:  No deformities, BUE and BLE strength normal and equal Skin: warm and dry  Neuro:  CNs 2-12 intact, no focal abnormalities noted Psych:  Normal affect   EKG:  The EKG was personally reviewed and demonstrates:  sinus  tachycardia HR 137 with diffuse ST elevation  Telemetry:  Telemetry was personally reviewed and demonstrates:  sinus tachycardia with RH 100-120  Relevant CV Studies:  Echo 05/03/2021: 1. Left ventricular ejection fraction, by estimation, is 60 to 65%. The  left ventricle has normal function. The left ventricle has no regional  wall motion abnormalities. Left ventricular diastolic parameters were  normal.   2. Right ventricular systolic function is normal. The right ventricular  size is normal.   3. Large  circumferential pericardial effusion largest in posterior and  lateral aspects of LV IVC not well seen no respiratory variation in mitral  inflow and only RA diastolic collapse not RV. Given likely diagnosis of  metastatic lung cancer consider  CVTS consult for pericardial window and for both Rx and diagnostic reasons  . Large pericardial effusion. The pericardial effusion is circumferential.   4. The mitral valve is normal in structure. Trivial mitral valve  regurgitation. No evidence of mitral stenosis.   5. The aortic valve is tricuspid. Aortic valve regurgitation is not  visualized. Mild to moderate aortic valve sclerosis/calcification is  present, without any evidence of aortic stenosis.   6. The inferior vena cava is normal in size with greater than 50%  respiratory variability, suggesting right atrial pressure of 3 mmHg.   Laboratory Data:  High Sensitivity Troponin:   Recent Labs  Lab 05/09/2021 1604 05/15/2021 1755  TROPONINIHS 49* 8     Chemistry Recent Labs  Lab 05/15/2021 1604 05/29/2021 0743 05/18/21 0221  NA 134* 136 134*  K 4.2 4.0 4.0  CL 101 105 105  CO2 19* 19* 20*  GLUCOSE 111* 104* 107*  BUN 31* 27* 20  CREATININE 1.58* 1.22 1.29*  CALCIUM 8.8* 8.4* 8.1*  GFRNONAA 53* >60 >60  ANIONGAP '14 12 9    ' Recent Labs  Lab 05/09/2021 1604  PROT 7.6  ALBUMIN 3.3*  AST 15  ALT 8  ALKPHOS 61  BILITOT 0.5   Lipids No results for input(s): CHOL, TRIG, HDL, LABVLDL, LDLCALC, CHOLHDL in the last 168 hours.  Hematology Recent Labs  Lab 05/13/2021 1341 05/12/2021 2050 05/28/2021 0743 05/13/2021 1413 05/30/2021 2000 05/18/21 0221  WBC 30.8*  --  25.3*  --   --  25.5*  RBC 2.73*  --  2.98*  --   --  2.67*  2.63*  HGB 6.9*   < > 7.8* 7.1* 7.1* 6.8*  HCT 21.6*   < > 23.0* 21.8* 21.4* 20.9*  MCV 79.1*  --  77.2*  --   --  78.3*  MCH  25.3*  --  26.2  --   --  25.5*  MCHC 31.9  --  33.9  --   --  32.5  RDW 20.0*  --  18.3*  --   --  18.1*  PLT 534*  --  393  --   --  389    < > = values in this interval not displayed.   Thyroid No results for input(s): TSH, FREET4 in the last 168 hours.  BNP Recent Labs  Lab 05/12/2021 1341  BNP 68.6    DDimer  Recent Labs  Lab 05/29/2021 1415  DDIMER 3.52*     Radiology/Studies:  DG Chest 2 View  Result Date: 05/11/2021 CLINICAL DATA:  Shortness of breath EXAM: CHEST - 2 VIEW COMPARISON:  Radiograph 04/15/2021 FINDINGS: Unchanged cardiomediastinal silhouette. New right lower lobe airspace disease. No large pleural effusion or visible pneumothorax. There is prominence of the azygous vein and right paratracheal stripe. There is an unchanged compression deformity in midthoracic spine. No acute osseous abnormality. IMPRESSION: Right lower lobe pneumonia. Recommend follow-up chest radiograph in 6-12 weeks to ensure resolution. Electronically Signed   By: Maurine Simmering M.D.   On: 05/09/2021 14:21   CT Angio Chest PE W and/or Wo Contrast  Result Date: 05/27/2021 CLINICAL DATA:  Pt here via EMS with c/o SOB with exertion. History of chronic bronchitis. Pt at PCP with SpO2 of 80. Placed on 2 liters with improvement of 100%. Chest tingling. Pt reports rectal bleeding X1 week EXAM: CT ANGIOGRAPHY CHEST WITH CONTRAST TECHNIQUE: Multidetector CT imaging of the chest was performed using the standard protocol during bolus administration of intravenous contrast. Multiplanar CT image reconstructions and MIPs were obtained to evaluate the vascular anatomy. CONTRAST:  68m OMNIPAQUE IOHEXOL 350 MG/ML SOLN COMPARISON:  Chest x-ray 05/23/2021, chest x-ray 04/15/2021 FINDINGS: Cardiovascular: Satisfactory opacification of the pulmonary arteries to the segmental level. No evidence of pulmonary embolism. The main pulmonary artery is normal in caliber. There is narrowing but no definite significant stenosis of the right main pulmonary artery due to external mass effect. Normal heart size. Small to moderate volume pericardial effusion. Left-to-right midline  shift. Mediastinum/Nodes: Infiltrative mass of the mediastinum with markedly limited evaluation of its borders due to timing of contrast: Measures up to at least 5.8 cm on axial imaging (10:229). Mediastinal lymphadenopathy with as an example a 2.7 cm right paratracheal lymph node (2:58). No definite left hilar lymphadenopathy. No axillary nodes. Question extension of the mass into the carina (11:61). Thyroid gland demonstrate no significant findings. The esophagus is not well visualized due to abutment to the mass. Lungs/Pleura: Complete collapse of the right lower lobe with associated endobronchial debris/lesion. A 1.7 cm ground-glass airspace opacity at the right apex. Associated vague peripheral ground-glass airspace opacities within the right upper lobe. At least mild to moderate paraseptal and centrilobular emphysematous changes. No focal consolidation, pulmonary nodule, mass of the left lung. No left pleural effusion. No right pleural effusion. No pneumothorax. Upper Abdomen: No acute abnormality. Musculoskeletal: Minimally displaced subacute to chronic fracture of the mid sternal body. Associated underlying cirrhosis may represent pathologic fracture versus healing. Otherwise no suspicious lytic or blastic osseous lesions. No acute displaced fracture. Multilevel degenerative changes of the spine. Slight vertebral body height loss anteriorly of the T9 vertebral body. Review of the MIP images confirms the above findings. IMPRESSION: 1. No pulmonary embolus; however, mild narrowing with no significant stenosis of the right main pulmonary artery due to external mass effect from the mass described  below. 2. Infiltrative mass of the mediastinum with markedly limited evaluation of its borders due to timing of contrast. Associated complete collapse of the right lower lobe with associated complete right lower lobe bronchi occlusion due to endobronchial debris/lesion. Question extension of the mass into the  carina.Associated left-to-right midline shift due to decreased lung volume. Concern for underlying malignancy - likely of the mediastinum with extent into or around the right mainstem bronchi leading to collapse the right lower lobe. Additional imaging evaluation or consultation with Pulmonology or Thoracic Surgery recommended. 3. Mediastinal lymphadenopathy. 4. A 1.7 cm ground-glass airspace opacity at the right apex. Associated vague peripheral ground-glass airspace opacities within the right upper lobe. 5. Small to moderate volume pericardial effusion. 6. Subacute to chronic fracture of the mid sternal body. Underlying sclerosis likely due to healing; however, pathologic fracture is not fully excluded. 7. Emphysema (ICD10-J43.9). Electronically Signed   By: Iven Finn M.D.   On: 05/24/2021 18:12   ECHOCARDIOGRAM COMPLETE  Result Date: 05/15/2021    ECHOCARDIOGRAM REPORT   Patient Name:   SUSUMU HACKLER Date of Exam: 05/22/2021 Medical Rec #:  [id number]    Height:       76.0 in Accession #:    [id number]   Weight:       135.1 lb Date of Birth:  27-Oct-1970     BSA:          1.875 m Patient Age:    47 years     BP:           108/78 mmHg Patient Gender: M            HR:           107 bpm. Exam Location:  Inpatient Procedure: 2D Echo and 3D Echo Indications:    Pericardial effusion  History:        Patient has no prior history of Echocardiogram examinations.                 COPD; Risk Factors:Current Smoker. ETOH abuse.  Sonographer:    Clayton Lefort RDCS (AE) Referring Phys: [phone] RAKESH V ALVA  Sonographer Comments: Suboptimal subcostal window. IMPRESSIONS  1. Left ventricular ejection fraction, by estimation, is 60 to 65%. The left ventricle has normal function. The left ventricle has no regional wall motion abnormalities. Left ventricular diastolic parameters were normal.  2. Right ventricular systolic function is normal. The right ventricular size is normal.  3. Large circumferential pericardial effusion  largest in posterior and lateral aspects of LV IVC not well seen no respiratory variation in mitral inflow and only RA diastolic collapse not RV. Given likely diagnosis of metastatic lung cancer consider CVTS consult for pericardial window and for both Rx and diagnostic reasons . Large pericardial effusion. The pericardial effusion is circumferential.  4. The mitral valve is normal in structure. Trivial mitral valve regurgitation. No evidence of mitral stenosis.  5. The aortic valve is tricuspid. Aortic valve regurgitation is not visualized. Mild to moderate aortic valve sclerosis/calcification is present, without any evidence of aortic stenosis.  6. The inferior vena cava is normal in size with greater than 50% respiratory variability, suggesting right atrial pressure of 3 mmHg. FINDINGS  Left Ventricle: Left ventricular ejection fraction, by estimation, is 60 to 65%. The left ventricle has normal function. The left ventricle has no regional wall motion abnormalities. The left ventricular internal cavity size was normal in size. There is  no left ventricular hypertrophy. Left ventricular diastolic parameters were normal.  Right Ventricle: The right ventricular size is normal. No increase in right ventricular wall thickness. Right ventricular systolic function is normal. Left Atrium: Left atrial size was normal in size. Right Atrium: Right atrial size was normal in size. Pericardium: Large circumferential pericardial effusion largest in posterior and lateral aspects of LV IVC not well seen no respiratory variation in mitral inflow and only RA diastolic collapse not RV. Given likely diagnosis of metastatic lung cancer consider CVTS consult for pericardial window and for both Rx and diagnostic reasons. A large pericardial effusion is present. The pericardial effusion is circumferential. Mitral Valve: The mitral valve is normal in structure. There is mild thickening of the mitral valve leaflet(s). There is mild  calcification of the mitral valve leaflet(s). Trivial mitral valve regurgitation. No evidence of mitral valve stenosis. Tricuspid Valve: The tricuspid valve is normal in structure. Tricuspid valve regurgitation is not demonstrated. No evidence of tricuspid stenosis. Aortic Valve: The aortic valve is tricuspid. Aortic valve regurgitation is not visualized. Mild to moderate aortic valve sclerosis/calcification is present, without any evidence of aortic stenosis. Aortic valve mean gradient measures 4.0 mmHg. Aortic valve peak gradient measures 5.9 mmHg. Aortic valve area, by VTI measures 2.93 cm. Pulmonic Valve: The pulmonic valve was normal in structure. Pulmonic valve regurgitation is not visualized. No evidence of pulmonic stenosis. Aorta: The aortic root is normal in size and structure. Venous: The inferior vena cava is normal in size with greater than 50% respiratory variability, suggesting right atrial pressure of 3 mmHg. IAS/Shunts: No atrial level shunt detected by color flow Doppler.  LEFT VENTRICLE PLAX 2D LVIDd:         4.10 cm   Diastology LVIDs:         2.60 cm   LV e' medial:    8.55 cm/s LV PW:         1.00 cm   LV E/e' medial:  10.2 LV IVS:        0.90 cm   LV e' lateral:   8.78 cm/s LVOT diam:     2.20 cm   LV E/e' lateral: 10.0 LV SV:         44 LV SV Index:   23 LVOT Area:     3.80 cm  RIGHT VENTRICLE RV Basal diam:  2.70 cm RV S prime:     25.50 cm/s TAPSE (M-mode): 2.8 cm LEFT ATRIUM             Index        RIGHT ATRIUM           Index LA diam:        3.00 cm 1.60 cm/m   RA Area:     13.20 cm LA Vol (A2C):   21.3 ml 11.36 ml/m  RA Volume:   32.30 ml  17.22 ml/m LA Vol (A4C):   51.5 ml 27.46 ml/m LA Biplane Vol: 33.5 ml 17.86 ml/m  AORTIC VALVE AV Area (Vmax):    2.87 cm AV Area (Vmean):   2.66 cm AV Area (VTI):     2.93 cm AV Vmax:           121.00 cm/s AV Vmean:          93.200 cm/s AV VTI:            0.149 m AV Peak Grad:      5.9 mmHg AV Mean Grad:      4.0 mmHg LVOT Vmax:  91.30 cm/s LVOT Vmean:        65.100 cm/s LVOT VTI:          0.115 m LVOT/AV VTI ratio: 0.77  AORTA Ao Root diam: 2.90 cm Ao Asc diam:  3.10 cm MITRAL VALVE               TRICUSPID VALVE MV Area (PHT): 5.34 cm    TR Peak grad:   21.5 mmHg MV Decel Time: 142 msec    TR Vmax:        232.00 cm/s MV E velocity: 87.50 cm/s MV A velocity: 78.30 cm/s  SHUNTS MV E/A ratio:  1.12        Systemic VTI:  0.12 m                            Systemic Diam: 2.20 cm Jenkins Rouge MD Electronically signed by Jenkins Rouge MD Signature Date/Time: 05/18/2021/3:57:03 PM    Final      Assessment and Plan:   Pericardial effusion - echo with large pericardial effusion with RA collapse in late systole - pt is satting well on room air - will continue to monitor overnight for decompensation - will discuss case with interventional team tomorrow to consider pericardiocentesis    Chest burning Pericarditis  - chest discomfort feels like a "rub" to the patient and changes in intensity with position changes, consistent with pericarditis - EKG with diffuse ST elevation concerning for pericarditis - will start colchicine 0.6 mg daily - avoid ibuprofen given bleeding   Sinus tachycardia - likely due to pericardial effusion and anemia - no BB with pericardial effusion   Hypertension - maintained on 5 mg amlodipine at home - holding this for now   Suspected lung cancer  PNA - per pulmonology, on ABX   Melena Anemia - per GI - hopefully will get scop tomorrow if prep is adequate   Daily alcohol use - denies history of alcohol withdrawal - now on CIWA    Risk Assessment/Risk Scores:          For questions or updates, please contact [place] Please consult [email or web address] for contact info under    Signed, Ledora Bottcher, PA  05/18/2021 12:25 PM

## 2021-05-18 NOTE — Interval H&P Note (Signed)
History and Physical Interval Note:  05/18/2021 7:40 AM  [name]  has presented today for surgery, with the diagnosis of Hematochezia, melena.  The various methods of treatment have been discussed with the patient and family. After consideration of risks, benefits and other options for treatment, the patient has consented to  Procedure(s): ESOPHAGOGASTRODUODENOSCOPY (EGD) WITH PROPOFOL (N/A) COLONOSCOPY WITH PROPOFOL (N/A) as a surgical intervention.  The patient's history has been reviewed, patient examined, no change in status, stable for surgery.  I have reviewed the patient's chart and labs.  Questions were answered to the patient's satisfaction.     Christopher Henry

## 2021-05-18 NOTE — Progress Notes (Signed)
     [address]       [place] [address]             [phone]       Patient's chart and images reviewed.  Case was discussed with primary attending.  50 year old gentleman with a large circumferential pericardial effusion in the setting of an underlying lung malignancy with mediastinal invasion.  On review of the echocardiogram there does appear to be some early tamponade physiology thus going directly to surgical decompression with general anesthesia will put this patient at high risk for cardiovascular collapse during induction.  I would recommend starting first with pericardial drain placement given that it is circumferential.  With this does not completely resolve the effusion, then we can consider surgical window.  Zygmund Passero Bary Leriche

## 2021-05-18 NOTE — Progress Notes (Signed)
Name: Christopher Henry MRN: [id number] DOB: 05/09/1971    ADMISSION DATE:  05/21/2021 CONSULTATION DATE:  05/18/2021   REFERRING MD :  Tegeler, EDP   CHIEF COMPLAINT: Hemoptysis, bright red blood per rectum   HISTORY OF PRESENT ILLNESS: 50 year old heavy smoker with COPD, seen by my partner Dr. Loanne Drilling in the pulmonary office and sent to the emergency room.  He reports hemoptysis for the past 6 days, increase shortness of breath and wheezing, unable to ambulate short distances.  He was noted to be bradycardic in the 50s in the office with saturation 80% on room air, placed on 3 L nasal cannula and referred to the emergency room.  CT angiogram chest showed no PE but showed mediastinal mass infiltrating into the carina with mediastinal lymphadenopathy including a 2.7 cm right paratracheal lymph node.  There was stenosis of the right main pulmonary artery there was a 1.7 cm groundglass nodule at the right apex and there was complete collapse of the right lower lobe.  There was a small volume pericardial effusion On further questioning, he reports shortness of breath ongoing for 3 months, 50 pound weight loss over the past 2 years. He also reported bright red blood per rectum, painless and sometimes without his knowledge which is not characteristic of his known hemorrhoids   Labs in the ED showed mild hyponatremia, leukocytosis 30 1K, hemoglobin of 6.9   Significant tests/ events reviewed 10/14 transfused 2 units PRBC 10/15 Bronchoscopy showed large subcarinal mass indenting the carina and bulging and bilaterally with large mainstem lesion obscuring 50% of lumen.  Biopsies taken with cryo and hemostasis achieved >>radiation oncology consult  PAST MEDICAL HISTORY :  Past Medical History:  Diagnosis Date   Anxiety state, unspecified 11/09/2012   Hypertension    SUBJECTIVE:  Low-grade febrile 100.6 Continues to have dark red blood per rectum Saturation 94% on room air Denies  hemoptysis  VITAL SIGNS: Temp:  [98 F (36.7 C)-100.6 F (38.1 C)] 98.4 F (36.9 C) (10/16 0745) Pulse Rate:  [107-122] 107 (10/16 0745) Resp:  [15-25] 23 (10/16 0745) BP: (106-136)/(76-88) 124/88 (10/16 0745) SpO2:  [92 %-99 %] 95 % (10/16 0745)  PHYSICAL EXAMINATION: Gen. thin man, in no distress, normal affect, able to lie supine ENT - no lesions, no post nasal drip, pallor +, no icterus Neck: No JVD, no thyromegaly, no carotid bruits Lungs: Decreased breath sounds right base, no accessory muscle use Cardiovascular: S1-S2 regular, no murmur Abdomen: soft and non-tender, no hepatosplenomegaly, BS normal. Musculoskeletal: No deformities, no cyanosis , clubbing2+ Neuro:  alert, non focal Skin:  Warm, no lesions/ rash   Recent Labs  Lab 05/23/2021 1604 05/07/2021 0743 05/18/21 0221  NA 134* 136 134*  K 4.2 4.0 4.0  CL 101 105 105  CO2 19* 19* 20*  BUN 31* 27* 20  CREATININE 1.58* 1.22 1.29*  GLUCOSE 111* 104* 107*    Recent Labs  Lab 05/13/2021 1341 05/10/2021 2050 05/28/2021 0743 05/14/2021 1413 05/23/2021 2000 05/18/21 0221  HGB 6.9*   < > 7.8* 7.1* 7.1* 6.8*  HCT 21.6*   < > 23.0* 21.8* 21.4* 20.9*  WBC 30.8*  --  25.3*  --   --  25.5*  PLT 534*  --  393  --   --  389   < > = values in this interval not displayed.    DG Chest 2 View  Result Date: 05/18/2021 CLINICAL DATA:  Shortness of breath EXAM: CHEST - 2 VIEW COMPARISON:  Radiograph 04/15/2021  FINDINGS: Unchanged cardiomediastinal silhouette. New right lower lobe airspace disease. No large pleural effusion or visible pneumothorax. There is prominence of the azygous vein and right paratracheal stripe. There is an unchanged compression deformity in midthoracic spine. No acute osseous abnormality. IMPRESSION: Right lower lobe pneumonia. Recommend follow-up chest radiograph in 6-12 weeks to ensure resolution. Electronically Signed   By: Maurine Simmering M.D.   On: 05/22/2021 14:21   CT Angio Chest PE W and/or Wo  Contrast  Result Date: 05/14/2021 CLINICAL DATA:  Pt here via EMS with c/o SOB with exertion. History of chronic bronchitis. Pt at PCP with SpO2 of 80. Placed on 2 liters with improvement of 100%. Chest tingling. Pt reports rectal bleeding X1 week EXAM: CT ANGIOGRAPHY CHEST WITH CONTRAST TECHNIQUE: Multidetector CT imaging of the chest was performed using the standard protocol during bolus administration of intravenous contrast. Multiplanar CT image reconstructions and MIPs were obtained to evaluate the vascular anatomy. CONTRAST:  58mL OMNIPAQUE IOHEXOL 350 MG/ML SOLN COMPARISON:  Chest x-ray 06/01/2021, chest x-ray 04/15/2021 FINDINGS: Cardiovascular: Satisfactory opacification of the pulmonary arteries to the segmental level. No evidence of pulmonary embolism. The main pulmonary artery is normal in caliber. There is narrowing but no definite significant stenosis of the right main pulmonary artery due to external mass effect. Normal heart size. Small to moderate volume pericardial effusion. Left-to-right midline shift. Mediastinum/Nodes: Infiltrative mass of the mediastinum with markedly limited evaluation of its borders due to timing of contrast: Measures up to at least 5.8 cm on axial imaging (10:229). Mediastinal lymphadenopathy with as an example a 2.7 cm right paratracheal lymph node (2:58). No definite left hilar lymphadenopathy. No axillary nodes. Question extension of the mass into the carina (11:61). Thyroid gland demonstrate no significant findings. The esophagus is not well visualized due to abutment to the mass. Lungs/Pleura: Complete collapse of the right lower lobe with associated endobronchial debris/lesion. A 1.7 cm ground-glass airspace opacity at the right apex. Associated vague peripheral ground-glass airspace opacities within the right upper lobe. At least mild to moderate paraseptal and centrilobular emphysematous changes. No focal consolidation, pulmonary nodule, mass of the left lung. No  left pleural effusion. No right pleural effusion. No pneumothorax. Upper Abdomen: No acute abnormality. Musculoskeletal: Minimally displaced subacute to chronic fracture of the mid sternal body. Associated underlying cirrhosis may represent pathologic fracture versus healing. Otherwise no suspicious lytic or blastic osseous lesions. No acute displaced fracture. Multilevel degenerative changes of the spine. Slight vertebral body height loss anteriorly of the T9 vertebral body. Review of the MIP images confirms the above findings. IMPRESSION: 1. No pulmonary embolus; however, mild narrowing with no significant stenosis of the right main pulmonary artery due to external mass effect from the mass described below. 2. Infiltrative mass of the mediastinum with markedly limited evaluation of its borders due to timing of contrast. Associated complete collapse of the right lower lobe with associated complete right lower lobe bronchi occlusion due to endobronchial debris/lesion. Question extension of the mass into the carina.Associated left-to-right midline shift due to decreased lung volume. Concern for underlying malignancy - likely of the mediastinum with extent into or around the right mainstem bronchi leading to collapse the right lower lobe. Additional imaging evaluation or consultation with Pulmonology or Thoracic Surgery recommended. 3. Mediastinal lymphadenopathy. 4. A 1.7 cm ground-glass airspace opacity at the right apex. Associated vague peripheral ground-glass airspace opacities within the right upper lobe. 5. Small to moderate volume pericardial effusion. 6. Subacute to chronic fracture of the mid sternal body.  Underlying sclerosis likely due to healing; however, pathologic fracture is not fully excluded. 7. Emphysema (ICD10-J43.9). Electronically Signed   By: Iven Finn M.D.   On: 05/30/2021 18:12   ECHOCARDIOGRAM COMPLETE  Result Date: 05/03/2021    ECHOCARDIOGRAM REPORT   Patient Name:   Christopher Henry  Date of Exam: 05/03/2021 Medical Rec #:  [id number]    Height:       76.0 in Accession #:    [id number]   Weight:       135.1 lb Date of Birth:  [birth year]     BSA:          1.875 m Patient Age:    37 years     BP:           108/78 mmHg Patient Gender: M            HR:           107 bpm. Exam Location:  Inpatient Procedure: 2D Echo and 3D Echo Indications:    Pericardial effusion  History:        Patient has no prior history of Echocardiogram examinations.                 COPD; Risk Factors:Current Smoker. ETOH abuse.  Sonographer:    Clayton Lefort RDCS (AE) Referring Phys: [phone] Beverlyann Broxterman V Joahan Swatzell  Sonographer Comments: Suboptimal subcostal window. IMPRESSIONS  1. Left ventricular ejection fraction, by estimation, is 60 to 65%. The left ventricle has normal function. The left ventricle has no regional wall motion abnormalities. Left ventricular diastolic parameters were normal.  2. Right ventricular systolic function is normal. The right ventricular size is normal.  3. Large circumferential pericardial effusion largest in posterior and lateral aspects of LV IVC not well seen no respiratory variation in mitral inflow and only RA diastolic collapse not RV. Given likely diagnosis of metastatic lung cancer consider CVTS consult for pericardial window and for both Rx and diagnostic reasons . Large pericardial effusion. The pericardial effusion is circumferential.  4. The mitral valve is normal in structure. Trivial mitral valve regurgitation. No evidence of mitral stenosis.  5. The aortic valve is tricuspid. Aortic valve regurgitation is not visualized. Mild to moderate aortic valve sclerosis/calcification is present, without any evidence of aortic stenosis.  6. The inferior vena cava is normal in size with greater than 50% respiratory variability, suggesting right atrial pressure of 3 mmHg. FINDINGS  Left Ventricle: Left ventricular ejection fraction, by estimation, is 60 to 65%. The left ventricle has normal function. The left  ventricle has no regional wall motion abnormalities. The left ventricular internal cavity size was normal in size. There is  no left ventricular hypertrophy. Left ventricular diastolic parameters were normal. Right Ventricle: The right ventricular size is normal. No increase in right ventricular wall thickness. Right ventricular systolic function is normal. Left Atrium: Left atrial size was normal in size. Right Atrium: Right atrial size was normal in size. Pericardium: Large circumferential pericardial effusion largest in posterior and lateral aspects of LV IVC not well seen no respiratory variation in mitral inflow and only RA diastolic collapse not RV. Given likely diagnosis of metastatic lung cancer consider CVTS consult for pericardial window and for both Rx and diagnostic reasons. A large pericardial effusion is present. The pericardial effusion is circumferential. Mitral Valve: The mitral valve is normal in structure. There is mild thickening of the mitral valve leaflet(s). There is mild calcification of the mitral valve leaflet(s). Trivial mitral valve regurgitation. No evidence  of mitral valve stenosis. Tricuspid Valve: The tricuspid valve is normal in structure. Tricuspid valve regurgitation is not demonstrated. No evidence of tricuspid stenosis. Aortic Valve: The aortic valve is tricuspid. Aortic valve regurgitation is not visualized. Mild to moderate aortic valve sclerosis/calcification is present, without any evidence of aortic stenosis. Aortic valve mean gradient measures 4.0 mmHg. Aortic valve peak gradient measures 5.9 mmHg. Aortic valve area, by VTI measures 2.93 cm. Pulmonic Valve: The pulmonic valve was normal in structure. Pulmonic valve regurgitation is not visualized. No evidence of pulmonic stenosis. Aorta: The aortic root is normal in size and structure. Venous: The inferior vena cava is normal in size with greater than 50% respiratory variability, suggesting right atrial pressure of 3 mmHg.  IAS/Shunts: No atrial level shunt detected by color flow Doppler.  LEFT VENTRICLE PLAX 2D LVIDd:         4.10 cm   Diastology LVIDs:         2.60 cm   LV e' medial:    8.55 cm/s LV PW:         1.00 cm   LV E/e' medial:  10.2 LV IVS:        0.90 cm   LV e' lateral:   8.78 cm/s LVOT diam:     2.20 cm   LV E/e' lateral: 10.0 LV SV:         44 LV SV Index:   23 LVOT Area:     3.80 cm  RIGHT VENTRICLE RV Basal diam:  2.70 cm RV S prime:     25.50 cm/s TAPSE (M-mode): 2.8 cm LEFT ATRIUM             Index        RIGHT ATRIUM           Index LA diam:        3.00 cm 1.60 cm/m   RA Area:     13.20 cm LA Vol (A2C):   21.3 ml 11.36 ml/m  RA Volume:   32.30 ml  17.22 ml/m LA Vol (A4C):   51.5 ml 27.46 ml/m LA Biplane Vol: 33.5 ml 17.86 ml/m  AORTIC VALVE AV Area (Vmax):    2.87 cm AV Area (Vmean):   2.66 cm AV Area (VTI):     2.93 cm AV Vmax:           121.00 cm/s AV Vmean:          93.200 cm/s AV VTI:            0.149 m AV Peak Grad:      5.9 mmHg AV Mean Grad:      4.0 mmHg LVOT Vmax:         91.30 cm/s LVOT Vmean:        65.100 cm/s LVOT VTI:          0.115 m LVOT/AV VTI ratio: 0.77  AORTA Ao Root diam: 2.90 cm Ao Asc diam:  3.10 cm MITRAL VALVE               TRICUSPID VALVE MV Area (PHT): 5.34 cm    TR Peak grad:   21.5 mmHg MV Decel Time: 142 msec    TR Vmax:        232.00 cm/s MV E velocity: 87.50 cm/s MV A velocity: 78.30 cm/s  SHUNTS MV E/A ratio:  1.12        Systemic VTI:  0.12 m  Systemic Diam: 2.20 cm Jenkins Rouge MD Electronically signed by Jenkins Rouge MD Signature Date/Time: 05/13/2021/3:57:03 PM    Final     ASSESSMENT / PLAN:  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   -Consulted radiation oncology, will need transfer to [place] at some point after procedures completed -Await biopsy  results, will need PET scan as outpatient  Large pericardial effusion confirmed on echo with RA collapse -May need pericardial window vs pericardiocentesis  Postobstructive/right lower lobe pneumonia -Treat with empiric antibiotics  Acute/chronic hypoxic respiratory failure -oxygen as needed -Bronchodilators for COPD/emphysema  Hematochezia/iron deficiency anemia- Colonoscopy planned by GI  PCCM will follow along   Kara Mead MD. FCCP. [facility] Pager [phone] If no response call [phone]    05/18/2021, 10:56 AM

## 2021-05-18 NOTE — Progress Notes (Signed)
PROGRESS NOTE    Christopher Henry  JOI:[id number] DOB: 07/18/1971 DOA: 05/30/2021 PCP: Bary Castilla, NP   Chief Complaint  Patient presents with   Shortness of Breath    Brief Narrative/Hospital Course:  [name], 50 y.o. male with PMH of COPD, HTN, alcohol abuse, tobacco use 1 pack/day x 35 years sent from pulmonary office to ED for acute respiratory distress hypoxic 80% on room air, nonproductive cough and hematemesis over the past 2 months, burning sensation on the chest and greater than 50 pound weight loss x12 months.  In the ED, anemic hemoglobin 6.9 g, leukocytosis, D-dimer 3.5, initial troponin 49 lactic acidosis 2.4 creatinine 1.5 chest x-ray RLL pneumonia. CT 18/14 no PE, infiltrative mass of the mediastinum, complete collapse of the right lower lobe with associated complete right lower lobe bronchi occlusion due to endobronchial debris/lesion,?  Mass extension into the carina, left to right midline shift due to decreased lung volume-concern for malignancy likely of the mediastinum with extension into or around the right mainstem bronchi leading to collapse of the right lower lobe, mediastinal lymphadenopathy, 1.7 cm groundglass opacity right apex, small to moderate volume pericardial effusion, subacute to chronic fracture of the medial sternal body. 10/15 underwent bronchoscopy-showed large subcarinal mass indenting the carina and bulging and bilaterally with large mainstem lesion obscuring 50% of lumen biopsies taken  Subjective: Seen and examined this morning.  Patient is alert awake oriented no new complaints.  Blood pressure is stable. He finished receiving additional unit PRBC H&H pending.  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  Underwent  bronchoscopy 10/15.  Radiation oncology consulted, will need transfer to [facility] at some point after procedure completed, biopsy pending will need PET scan as outpatient.  Large pericardial effusion confirmed on echo with RA collapse: Likely malignant.  I had discussed with Dr. Theodoro Parma from to CTS who advised cardio eval first.I have notified cardiology Dr. Margaretann Loveless  Acute hypoxemic respiratory failure hypoxic 80% on room air with respiratory distress on admission: Due to pneumonia/mediastinal mass: Resolved.  Currently on room air.  Sepsis secondary to pneumonia RLL post obstructive pneumonia: In the setting of most likely malignancy.  Continue ceftriaxone azithromycin.  Pulmonary following.  Monitor WBC count temperature.  WBC slightly downtrending Recent Labs  Lab 05/07/2021 1341 05/13/2021 1604 05/06/2021 1755 05/09/2021 2054 05/18/2021 0743 05/18/21 0221  WBC 30.8*  --   --   --  25.3* 25.5*  LATICACIDVEN  --  2.4* 2.7*  --   --   --   PROCALCITON  --   --   --  0.19 0.25 0.25   Acute blood loss anemia in the setting of painless rectal bleeding BRBPR Microcytic anemia suspect anemia of chronic disease as well History of chronic hemorrhoids-which he was able to reduce at home: Status post multiple PRBC transfusion.  H&H overall stable.  GI on board for colonoscopy but due to poor prep holding today.  Monitor serial H&H.  Continue PPI.  Again status post iron PRBC overnight.  repeat H&H pending. Recent Labs  Lab 05/04/2021 0014 05/24/2021 0743 05/23/2021 1413 05/13/2021 2000 05/18/21 0221  HGB 6.6* 7.8* 7.1* 7.1* 6.8*  HCT 20.2* 23.0* 21.8* 21.4* 20.9*   AKI likely in the setting of dehydration/pneumonia/malignancy.  Creatinine slightly better.  Monitor BMP Recent Labs  Lab 05/29/2021 1341 05/22/2021 1604 05/18/2021 0743 05/18/21  0221  BUN 29* 31* 27* 20  CREATININE 1.60* 1.58* 1.22 1.29*    Anion gap metabolic acidosis due to AKI: Bicarb at 20.  Monitor  COPD: Currently not in  exacerbation.  Continue Dulera, Incruse Ellipta and bronchodilators as needed  Severe protein calorie moderation with significant weight loss and BMI 16.45.  RD consulted  History of alcohol use/tobacco use patient reports he recently quit.  Continue thiamine folic acid multivitamins and watch for withdrawals.  DVT prophylaxis: SCDs Start: 05/30/2021 2105.  No chemeical prophylaxis due to anemia Code Status:   Code Status: Full Code Family Communication: plan of care discussed with patient at bedside. Status is: Inpatient  Remains inpatient appropriate because: Ongoing management of anemia, pericardial effusion, lung mass pneumonia.  Objective: Vitals: Today's Vitals   05/18/21 0430 05/18/21 0445 05/18/21 0745 05/18/21 0801  BP: 132/87 136/86 124/88   Pulse: (!) 117 (!) 119 (!) 107   Resp: 20 20 (!) 23   Temp: (!) 100.6 F (38.1 C)  98.4 F (36.9 C)   TempSrc: Oral  Oral   SpO2: 93% 95% 95%   Weight:      PainSc:    0-No pain   Physical Examination: General exam: AA0x3, weak,older than stated age. HEENT:Oral mucosa moist, Ear/Nose WNL grossly,dentition normal. Respiratory system: B/l clear BS, no use of accessory muscle, non tender. Cardiovascular system: S1 & S2 +,No JVD. Gastrointestinal system: Abdomen soft, NT,ND, BS+. Nervous System:Alert, awake, moving extremities. Extremities: edema none, distal peripheral pulses palpable.  Skin: No rashes, no icterus. MSK: thin muscle bulk, tone, power.  Medications reviewed:  Scheduled Meds:  folic acid  1 mg Oral Daily   mometasone-formoterol  2 puff Inhalation BID   And   umeclidinium bromide  1 puff Inhalation Daily   multivitamin with minerals  1 tablet Oral Daily   pantoprazole  40 mg Oral BID   peg 3350 powder  0.5 kit Oral Once   And   [START ON 05/29/2021] peg 3350 powder  0.5 kit Oral Once   thiamine  100 mg Oral Daily   Or   thiamine  100 mg Intravenous Daily   Continuous Infusions:  sodium chloride      azithromycin 500 mg (05/11/2021 2159)   cefTRIAXone (ROCEPHIN)  IV 1 g (05/13/2021 2342)   lactated ringers 75 mL/hr at 05/25/2021 0615    Diet Order             Diet NPO time specified  Diet effective midnight           Diet clear liquid Room service appropriate? Yes; Fluid consistency: Thin  Diet effective now                          Intake/Output  Intake/Output Summary (Last 24 hours) at 05/18/2021 0916 Last data filed at 05/13/2021 1812 Gross per 24 hour  Intake 1677.19 ml  Output 3 ml  Net 1674.19 ml   Intake/Output from previous day: 10/15 0701 - 10/16 0700 In: 1677.2 [P.O.:480; I.V.:947.2; IV Piggyback:250] Out: 3 [Blood:3] Net IO Since Admission: 3,170.71 mL [05/18/21 0916]   Weight change:   Wt Readings from Last 3 Encounters:  05/22/2021 61.3 kg  05/18/2021 59.4 kg  04/24/21 60.3 kg     Consultants:see note  Procedures:see note Antimicrobials: Anti-infectives (From admission, onward)    Start     Dose/Rate Route Frequency Ordered Stop   05/03/2021 2145  cefTRIAXone (ROCEPHIN) 1 g in sodium  chloride 0.9 % 100 mL IVPB        1 g 200 mL/hr over 30 Minutes Intravenous Every 24 hours 06/01/2021 2053     05/06/2021 2145  azithromycin (ZITHROMAX) 500 mg in sodium chloride 0.9 % 250 mL IVPB        500 mg 250 mL/hr over 60 Minutes Intravenous Every 24 hours 06/02/2021 2053 05/21/21 2144   05/03/2021 1545  ceFEPIme (MAXIPIME) 2 g in sodium chloride 0.9 % 100 mL IVPB        2 g 200 mL/hr over 30 Minutes Intravenous  Once 05/08/2021 1539 05/23/2021 1640      Culture/Microbiology    Component Value Date/Time   SDES BLOOD LEFT ANTECUBITAL 05/26/2021 1604   SPECREQUEST  05/18/2021 1604    BOTTLES DRAWN AEROBIC AND ANAEROBIC Blood Culture results may not be optimal due to an inadequate volume of blood received in culture bottles   CULT  05/11/2021 1604    NO GROWTH 2 DAYS Performed at [facility], [place] [address], [place], [place] [address]    REPTSTATUS PENDING  05/08/2021 1604    Other culture-see note  Unresulted Labs (From admission, onward)     Start     Ordered   05/18/21 1000  Hemoglobin and hematocrit, blood  Once,   TIMED        05/18/21 1000   05/13/2021 0842  Hemoglobin and hematocrit, blood  Now then every 6 hours,   R (with TIMED occurrences)      05/12/2021 0842   06/02/2021 1536  Urinalysis, Routine w reflex microscopic Urine, Clean Catch  (Undifferentiated presentation (screening labs and basic nursing orders))  ONCE - STAT,   STAT        05/27/2021 1538           Data Reviewed: I have personally reviewed following labs and imaging studies CBC: Recent Labs  Lab 05/04/2021 1341 05/09/2021 2050 05/28/2021 0014 05/22/2021 0743 05/10/2021 1413 06/01/2021 2000 05/18/21 0221  WBC 30.8*  --   --  25.3*  --   --  25.5*  NEUTROABS 26.8*  --   --   --   --   --   --   HGB 6.9*   < > 6.6* 7.8* 7.1* 7.1* 6.8*  HCT 21.6*   < > 20.2* 23.0* 21.8* 21.4* 20.9*  MCV 79.1*  --   --  77.2*  --   --  78.3*  PLT 534*  --   --  393  --   --  389   < > = values in this interval not displayed.   Basic Metabolic Panel: Recent Labs  Lab 05/04/2021 1341 05/28/2021 1604 05/22/2021 0743 05/18/21 0221  NA 133* 134* 136 134*  K 3.9 4.2 4.0 4.0  CL 101 101 105 105  CO2 20* 19* 19* 20*  GLUCOSE 147* 111* 104* 107*  BUN 29* 31* 27* 20  CREATININE 1.60* 1.58* 1.22 1.29*  CALCIUM 9.0 8.8* 8.4* 8.1*   GFR: Estimated Creatinine Clearance: 59.4 mL/min (A) (by C-G formula based on SCr of 1.29 mg/dL (H)). Liver Function Tests: Recent Labs  Lab 05/12/2021 1604  AST 15  ALT 8  ALKPHOS 61  BILITOT 0.5  PROT 7.6  ALBUMIN 3.3*   Recent Labs  Lab 05/09/2021 1604  LIPASE 29   Recent Labs  Lab 05/22/2021 1604  AMMONIA <10   Coagulation Profile: Recent Labs  Lab 05/04/2021 1604  INR 1.4*   Cardiac Enzymes: No  results for input(s): CKTOTAL, CKMB, CKMBINDEX, TROPONINI in the last 168 hours. BNP (last 3 results) No results for input(s): PROBNP in the last 8760  hours. HbA1C: No results for input(s): HGBA1C in the last 72 hours. CBG: No results for input(s): GLUCAP in the last 168 hours. Lipid Profile: No results for input(s): CHOL, HDL, LDLCALC, TRIG, CHOLHDL, LDLDIRECT in the last 72 hours. Thyroid Function Tests: No results for input(s): TSH, T4TOTAL, FREET4, T3FREE, THYROIDAB in the last 72 hours. Anemia Panel: Recent Labs    05/18/21 0221  FERRITIN 74  TIBC 280  IRON 10*  RETICCTPCT 3.0   Sepsis Labs: Recent Labs  Lab 05/06/2021 1604 05/10/2021 1755 05/21/2021 2054 05/30/2021 0743 05/18/21 0221  PROCALCITON  --   --  0.19 0.25 0.25  LATICACIDVEN 2.4* 2.7*  --   --   --     Recent Results (from the past 240 hour(s))  Resp Panel by RT-PCR (Flu A&B, Covid) Nasopharyngeal Swab     Status: None   Collection Time: 05/05/2021  3:38 PM   Specimen: Nasopharyngeal Swab; Nasopharyngeal(NP) swabs in vial transport medium  Result Value Ref Range Status   SARS Coronavirus 2 by RT PCR NEGATIVE NEGATIVE Final    Comment: (NOTE) SARS-CoV-2 target nucleic acids are NOT DETECTED.  The SARS-CoV-2 RNA is generally detectable in upper respiratory specimens during the acute phase of infection. The lowest concentration of SARS-CoV-2 viral copies this assay can detect is 138 copies/mL. A negative result does not preclude SARS-Cov-2 infection and should not be used as the sole basis for treatment or other patient management decisions. A negative result may occur with  improper specimen collection/handling, submission of specimen other than nasopharyngeal swab, presence of viral mutation(s) within the areas targeted by this assay, and inadequate number of viral copies(<138 copies/mL). A negative result must be combined with clinical observations, patient history, and epidemiological information. The expected result is Negative.  Fact Sheet for Patients:  EntrepreneurPulse.com.au  Fact Sheet for Healthcare Providers:   IncredibleEmployment.be  This test is no t yet approved or cleared by the [place] FDA and  has been authorized for detection and/or diagnosis of SARS-CoV-2 by FDA under an Emergency Use Authorization (EUA). This EUA will remain  in effect (meaning this test can be used) for the duration of the COVID-19 declaration under Section 564(b)(1) of the Act, 21 U.S.C.section 360bbb-3(b)(1), unless the authorization is terminated  or revoked sooner.       Influenza A by PCR NEGATIVE NEGATIVE Final   Influenza B by PCR NEGATIVE NEGATIVE Final    Comment: (NOTE) The Xpert Xpress SARS-CoV-2/FLU/RSV plus assay is intended as an aid in the diagnosis of influenza from Nasopharyngeal swab specimens and should not be used as a sole basis for treatment. Nasal washings and aspirates are unacceptable for Xpert Xpress SARS-CoV-2/FLU/RSV testing.  Fact Sheet for Patients: EntrepreneurPulse.com.au  Fact Sheet for Healthcare Providers: IncredibleEmployment.be  This test is not yet approved or cleared by the [place] FDA and has been authorized for detection and/or diagnosis of SARS-CoV-2 by FDA under an Emergency Use Authorization (EUA). This EUA will remain in effect (meaning this test can be used) for the duration of the COVID-19 declaration under Section 564(b)(1) of the Act, 21 U.S.C. section 360bbb-3(b)(1), unless the authorization is terminated or revoked.  Performed at [facility], [place] [address], [place], [place] [address]   Blood culture (routine single)     Status: None (Preliminary result)   Collection Time: 05/22/2021  4:04 PM   Specimen: BLOOD  Result Value Ref Range Status   Specimen Description BLOOD LEFT ANTECUBITAL  Final   Special Requests   Final    BOTTLES DRAWN AEROBIC AND ANAEROBIC Blood Culture results may not be optimal due to an inadequate volume of blood received in culture bottles   Culture   Final     NO GROWTH 2 DAYS Performed at [facility] [address] [place], [place] [address]    Report Status PENDING  Incomplete     Radiology Studies: DG Chest 2 View  Result Date: 05/03/2021 CLINICAL DATA:  Shortness of breath EXAM: CHEST - 2 VIEW COMPARISON:  Radiograph 04/15/2021 FINDINGS: Unchanged cardiomediastinal silhouette. New right lower lobe airspace disease. No large pleural effusion or visible pneumothorax. There is prominence of the azygous vein and right paratracheal stripe. There is an unchanged compression deformity in midthoracic spine. No acute osseous abnormality. IMPRESSION: Right lower lobe pneumonia. Recommend follow-up chest radiograph in 6-12 weeks to ensure resolution. Electronically Signed   By: Maurine Simmering M.D.   On: 05/22/2021 14:21   CT Angio Chest PE W and/or Wo Contrast  Result Date: 05/09/2021 CLINICAL DATA:  Pt here via EMS with c/o SOB with exertion. History of chronic bronchitis. Pt at PCP with SpO2 of 80. Placed on 2 liters with improvement of 100%. Chest tingling. Pt reports rectal bleeding X1 week EXAM: CT ANGIOGRAPHY CHEST WITH CONTRAST TECHNIQUE: Multidetector CT imaging of the chest was performed using the standard protocol during bolus administration of intravenous contrast. Multiplanar CT image reconstructions and MIPs were obtained to evaluate the vascular anatomy. CONTRAST:  92m OMNIPAQUE IOHEXOL 350 MG/ML SOLN COMPARISON:  Chest x-ray 05/11/2021, chest x-ray 04/15/2021 FINDINGS: Cardiovascular: Satisfactory opacification of the pulmonary arteries to the segmental level. No evidence of pulmonary embolism. The main pulmonary artery is normal in caliber. There is narrowing but no definite significant stenosis of the right main pulmonary artery due to external mass effect. Normal heart size. Small to moderate volume pericardial effusion. Left-to-right midline shift. Mediastinum/Nodes: Infiltrative mass of the mediastinum with markedly limited  evaluation of its borders due to timing of contrast: Measures up to at least 5.8 cm on axial imaging (10:229). Mediastinal lymphadenopathy with as an example a 2.7 cm right paratracheal lymph node (2:58). No definite left hilar lymphadenopathy. No axillary nodes. Question extension of the mass into the carina (11:61). Thyroid gland demonstrate no significant findings. The esophagus is not well visualized due to abutment to the mass. Lungs/Pleura: Complete collapse of the right lower lobe with associated endobronchial debris/lesion. A 1.7 cm ground-glass airspace opacity at the right apex. Associated vague peripheral ground-glass airspace opacities within the right upper lobe. At least mild to moderate paraseptal and centrilobular emphysematous changes. No focal consolidation, pulmonary nodule, mass of the left lung. No left pleural effusion. No right pleural effusion. No pneumothorax. Upper Abdomen: No acute abnormality. Musculoskeletal: Minimally displaced subacute to chronic fracture of the mid sternal body. Associated underlying cirrhosis may represent pathologic fracture versus healing. Otherwise no suspicious lytic or blastic osseous lesions. No acute displaced fracture. Multilevel degenerative changes of the spine. Slight vertebral body height loss anteriorly of the T9 vertebral body. Review of the MIP images confirms the above findings. IMPRESSION: 1. No pulmonary embolus; however, mild narrowing with no significant stenosis of the right main pulmonary artery due to external mass effect from the mass described below. 2. Infiltrative mass of the mediastinum with markedly limited evaluation of its borders due to  timing of contrast. Associated complete collapse of the right lower lobe with associated complete right lower lobe bronchi occlusion due to endobronchial debris/lesion. Question extension of the mass into the carina.Associated left-to-right midline shift due to decreased lung volume. Concern for  underlying malignancy - likely of the mediastinum with extent into or around the right mainstem bronchi leading to collapse the right lower lobe. Additional imaging evaluation or consultation with Pulmonology or Thoracic Surgery recommended. 3. Mediastinal lymphadenopathy. 4. A 1.7 cm ground-glass airspace opacity at the right apex. Associated vague peripheral ground-glass airspace opacities within the right upper lobe. 5. Small to moderate volume pericardial effusion. 6. Subacute to chronic fracture of the mid sternal body. Underlying sclerosis likely due to healing; however, pathologic fracture is not fully excluded. 7. Emphysema (ICD10-J43.9). Electronically Signed   By: Iven Finn M.D.   On: 05/14/2021 18:12   ECHOCARDIOGRAM COMPLETE  Result Date: 05/08/2021    ECHOCARDIOGRAM REPORT   Patient Name:   MOZELL HARDACRE Date of Exam: 05/24/2021 Medical Rec #:  [id number]    Height:       76.0 in Accession #:    [id number]   Weight:       135.1 lb Date of Birth:  1970/10/20     BSA:          1.875 m Patient Age:    34 years     BP:           108/78 mmHg Patient Gender: M            HR:           107 bpm. Exam Location:  Inpatient Procedure: 2D Echo and 3D Echo Indications:    Pericardial effusion  History:        Patient has no prior history of Echocardiogram examinations.                 COPD; Risk Factors:Current Smoker. ETOH abuse.  Sonographer:    Clayton Lefort RDCS (AE) Referring Phys: [phone] RAKESH V ALVA  Sonographer Comments: Suboptimal subcostal window. IMPRESSIONS  1. Left ventricular ejection fraction, by estimation, is 60 to 65%. The left ventricle has normal function. The left ventricle has no regional wall motion abnormalities. Left ventricular diastolic parameters were normal.  2. Right ventricular systolic function is normal. The right ventricular size is normal.  3. Large circumferential pericardial effusion largest in posterior and lateral aspects of LV IVC not well seen no respiratory variation in  mitral inflow and only RA diastolic collapse not RV. Given likely diagnosis of metastatic lung cancer consider CVTS consult for pericardial window and for both Rx and diagnostic reasons . Large pericardial effusion. The pericardial effusion is circumferential.  4. The mitral valve is normal in structure. Trivial mitral valve regurgitation. No evidence of mitral stenosis.  5. The aortic valve is tricuspid. Aortic valve regurgitation is not visualized. Mild to moderate aortic valve sclerosis/calcification is present, without any evidence of aortic stenosis.  6. The inferior vena cava is normal in size with greater than 50% respiratory variability, suggesting right atrial pressure of 3 mmHg. FINDINGS  Left Ventricle: Left ventricular ejection fraction, by estimation, is 60 to 65%. The left ventricle has normal function. The left ventricle has no regional wall motion abnormalities. The left ventricular internal cavity size was normal in size. There is  no left ventricular hypertrophy. Left ventricular diastolic parameters were normal. Right Ventricle: The right ventricular size is normal. No increase in right ventricular wall thickness. Right  ventricular systolic function is normal. Left Atrium: Left atrial size was normal in size. Right Atrium: Right atrial size was normal in size. Pericardium: Large circumferential pericardial effusion largest in posterior and lateral aspects of LV IVC not well seen no respiratory variation in mitral inflow and only RA diastolic collapse not RV. Given likely diagnosis of metastatic lung cancer consider CVTS consult for pericardial window and for both Rx and diagnostic reasons. A large pericardial effusion is present. The pericardial effusion is circumferential. Mitral Valve: The mitral valve is normal in structure. There is mild thickening of the mitral valve leaflet(s). There is mild calcification of the mitral valve leaflet(s). Trivial mitral valve regurgitation. No evidence of  mitral valve stenosis. Tricuspid Valve: The tricuspid valve is normal in structure. Tricuspid valve regurgitation is not demonstrated. No evidence of tricuspid stenosis. Aortic Valve: The aortic valve is tricuspid. Aortic valve regurgitation is not visualized. Mild to moderate aortic valve sclerosis/calcification is present, without any evidence of aortic stenosis. Aortic valve mean gradient measures 4.0 mmHg. Aortic valve peak gradient measures 5.9 mmHg. Aortic valve area, by VTI measures 2.93 cm. Pulmonic Valve: The pulmonic valve was normal in structure. Pulmonic valve regurgitation is not visualized. No evidence of pulmonic stenosis. Aorta: The aortic root is normal in size and structure. Venous: The inferior vena cava is normal in size with greater than 50% respiratory variability, suggesting right atrial pressure of 3 mmHg. IAS/Shunts: No atrial level shunt detected by color flow Doppler.  LEFT VENTRICLE PLAX 2D LVIDd:         4.10 cm   Diastology LVIDs:         2.60 cm   LV e' medial:    8.55 cm/s LV PW:         1.00 cm   LV E/e' medial:  10.2 LV IVS:        0.90 cm   LV e' lateral:   8.78 cm/s LVOT diam:     2.20 cm   LV E/e' lateral: 10.0 LV SV:         44 LV SV Index:   23 LVOT Area:     3.80 cm  RIGHT VENTRICLE RV Basal diam:  2.70 cm RV S prime:     25.50 cm/s TAPSE (M-mode): 2.8 cm LEFT ATRIUM             Index        RIGHT ATRIUM           Index LA diam:        3.00 cm 1.60 cm/m   RA Area:     13.20 cm LA Vol (A2C):   21.3 ml 11.36 ml/m  RA Volume:   32.30 ml  17.22 ml/m LA Vol (A4C):   51.5 ml 27.46 ml/m LA Biplane Vol: 33.5 ml 17.86 ml/m  AORTIC VALVE AV Area (Vmax):    2.87 cm AV Area (Vmean):   2.66 cm AV Area (VTI):     2.93 cm AV Vmax:           121.00 cm/s AV Vmean:          93.200 cm/s AV VTI:            0.149 m AV Peak Grad:      5.9 mmHg AV Mean Grad:      4.0 mmHg LVOT Vmax:         91.30 cm/s LVOT Vmean:        65.100 cm/s LVOT VTI:  0.115 m LVOT/AV VTI ratio: 0.77  AORTA  Ao Root diam: 2.90 cm Ao Asc diam:  3.10 cm MITRAL VALVE               TRICUSPID VALVE MV Area (PHT): 5.34 cm    TR Peak grad:   21.5 mmHg MV Decel Time: 142 msec    TR Vmax:        232.00 cm/s MV E velocity: 87.50 cm/s MV A velocity: 78.30 cm/s  SHUNTS MV E/A ratio:  1.12        Systemic VTI:  0.12 m                            Systemic Diam: 2.20 cm Jenkins Rouge MD Electronically signed by Jenkins Rouge MD Signature Date/Time: 05/03/2021/3:57:03 PM    Final      LOS: 2 days   Antonieta Pert, MD [facility]  05/18/2021, 9:16 AM

## 2021-05-18 NOTE — Progress Notes (Signed)
  Gastroenterology Inpatient Follow Up    Subjective: Still having brown stools with solid pieces within it. Drank only a quarter of his bowel preparation. Patient is willing to drink more prep.  Objective: Vital signs in last 24 hours: Temp:  [98 F (36.7 C)-100.6 F (38.1 C)] 100.6 F (38.1 C) (10/16 0430) Pulse Rate:  [108-122] 119 (10/16 0445) Resp:  [15-25] 20 (10/16 0445) BP: (106-136)/(62-87) 136/86 (10/16 0445) SpO2:  [92 %-100 %] 95 % (10/16 0445) FiO2 (%):  [21 %] 21 % (10/15 1036) Last BM Date: 05/18/21  Intake/Output from previous day: 10/15 0701 - 10/16 0700 In: 1677.2 [P.O.:480; I.V.:947.2; IV Piggyback:250] Out: 3 [Blood:3] Intake/Output this shift: No intake/output data recorded.  General appearance: alert and cooperative Resp: no increased WOB Cardio: tachycardic GI: soft, nondistended Extremities: no edema  Lab Results: Recent Labs    05/21/2021 1341 05/14/2021 2050 05/11/2021 0743 05/26/2021 1413 05/22/2021 2000 05/18/21 0221  WBC 30.8*  --  25.3*  --   --  25.5*  HGB 6.9*   < > 7.8* 7.1* 7.1* 6.8*  HCT 21.6*   < > 23.0* 21.8* 21.4* 20.9*  PLT 534*  --  393  --   --  389   < > = values in this interval not displayed.   BMET Recent Labs    05/06/2021 1604 05/15/2021 0743 05/18/21 0221  NA 134* 136 134*  K 4.2 4.0 4.0  CL 101 105 105  CO2 19* 19* 20*  GLUCOSE 111* 104* 107*  BUN 31* 27* 20  CREATININE 1.58* 1.22 1.29*  CALCIUM 8.8* 8.4* 8.1*   LFT Recent Labs    06/02/2021 1604  PROT 7.6  ALBUMIN 3.3*  AST 15  ALT 8  ALKPHOS 61  BILITOT 0.5   PT/INR Recent Labs    05/31/2021 1604  LABPROT 16.9*  INR 1.4*   Hepatitis Panel No results for input(s): HEPBSAG, HCVAB, HEPAIGM, HEPBIGM in the last 72 hours. C-Diff No results for input(s): CDIFFTOX in the last 72 hours.  Studies/Results: DG Chest 2 View  Result Date: 05/15/2021 CLINICAL DATA:  Shortness of breath EXAM: CHEST - 2 VIEW COMPARISON:  Radiograph 04/15/2021 FINDINGS:  Unchanged cardiomediastinal silhouette. New right lower lobe airspace disease. No large pleural effusion or visible pneumothorax. There is prominence of the azygous vein and right paratracheal stripe. There is an unchanged compression deformity in midthoracic spine. No acute osseous abnormality. IMPRESSION: Right lower lobe pneumonia. Recommend follow-up chest radiograph in 6-12 weeks to ensure resolution. Electronically Signed   By: Jacob  Kahn M.D.   On: 05/14/2021 14:21   CT Angio Chest PE W and/or Wo Contrast  Result Date: 05/16/2021 CLINICAL DATA:  Pt here via EMS with c/o SOB with exertion. History of chronic bronchitis. Pt at PCP with SpO2 of 80. Placed on 2 liters with improvement of 100%. Chest tingling. Pt reports rectal bleeding X1 week EXAM: CT ANGIOGRAPHY CHEST WITH CONTRAST TECHNIQUE: Multidetector CT imaging of the chest was performed using the standard protocol during bolus administration of intravenous contrast. Multiplanar CT image reconstructions and MIPs were obtained to evaluate the vascular anatomy. CONTRAST:  65mL OMNIPAQUE IOHEXOL 350 MG/ML SOLN COMPARISON:  Chest x-ray 05/16/2021, chest x-ray 04/15/2021 FINDINGS: Cardiovascular: Satisfactory opacification of the pulmonary arteries to the segmental level. No evidence of pulmonary embolism. The main pulmonary artery is normal in caliber. There is narrowing but no definite significant stenosis of the right main pulmonary artery due to external mass effect. Normal heart size. Small to   moderate volume pericardial effusion. Left-to-right midline shift. Mediastinum/Nodes: Infiltrative mass of the mediastinum with markedly limited evaluation of its borders due to timing of contrast: Measures up to at least 5.8 cm on axial imaging (10:229). Mediastinal lymphadenopathy with as an example a 2.7 cm right paratracheal lymph node (2:58). No definite left hilar lymphadenopathy. No axillary nodes. Question extension of the mass into the carina (11:61).  Thyroid gland demonstrate no significant findings. The esophagus is not well visualized due to abutment to the mass. Lungs/Pleura: Complete collapse of the right lower lobe with associated endobronchial debris/lesion. A 1.7 cm ground-glass airspace opacity at the right apex. Associated vague peripheral ground-glass airspace opacities within the right upper lobe. At least mild to moderate paraseptal and centrilobular emphysematous changes. No focal consolidation, pulmonary nodule, mass of the left lung. No left pleural effusion. No right pleural effusion. No pneumothorax. Upper Abdomen: No acute abnormality. Musculoskeletal: Minimally displaced subacute to chronic fracture of the mid sternal body. Associated underlying cirrhosis may represent pathologic fracture versus healing. Otherwise no suspicious lytic or blastic osseous lesions. No acute displaced fracture. Multilevel degenerative changes of the spine. Slight vertebral body height loss anteriorly of the T9 vertebral body. Review of the MIP images confirms the above findings. IMPRESSION: 1. No pulmonary embolus; however, mild narrowing with no significant stenosis of the right main pulmonary artery due to external mass effect from the mass described below. 2. Infiltrative mass of the mediastinum with markedly limited evaluation of its borders due to timing of contrast. Associated complete collapse of the right lower lobe with associated complete right lower lobe bronchi occlusion due to endobronchial debris/lesion. Question extension of the mass into the carina.Associated left-to-right midline shift due to decreased lung volume. Concern for underlying malignancy - likely of the mediastinum with extent into or around the right mainstem bronchi leading to collapse the right lower lobe. Additional imaging evaluation or consultation with Pulmonology or Thoracic Surgery recommended. 3. Mediastinal lymphadenopathy. 4. A 1.7 cm ground-glass airspace opacity at the  right apex. Associated vague peripheral ground-glass airspace opacities within the right upper lobe. 5. Small to moderate volume pericardial effusion. 6. Subacute to chronic fracture of the mid sternal body. Underlying sclerosis likely due to healing; however, pathologic fracture is not fully excluded. 7. Emphysema (ICD10-J43.9). Electronically Signed   By: Morgane  Naveau M.D.   On: 05/15/2021 18:12   ECHOCARDIOGRAM COMPLETE  Result Date: 05/05/2021    ECHOCARDIOGRAM REPORT   Patient Name:   Christopher Henry Date of Exam: 05/05/2021 Medical Rec #:  [id number]    Height:       76.0 in Accession #:    [id number]   Weight:       135.1 lb Date of Birth:  12/07/1970     BSA:          1.875 m Patient Age:    50 years     BP:           108/78 mmHg Patient Gender: M            HR:           107 bpm. Exam Location:  Inpatient Procedure: 2D Echo and 3D Echo Indications:    Pericardial effusion  History:        Patient has no prior history of Echocardiogram examinations.                 COPD; Risk Factors:Current Smoker. ETOH abuse.  Sonographer:    Arthur Guy RDCS (AE) Referring   Phys: 60 RAKESH V ALVA  Sonographer Comments: Suboptimal subcostal window. IMPRESSIONS  1. Left ventricular ejection fraction, by estimation, is 60 to 65%. The left ventricle has normal function. The left ventricle has no regional wall motion abnormalities. Left ventricular diastolic parameters were normal.  2. Right ventricular systolic function is normal. The right ventricular size is normal.  3. Large circumferential pericardial effusion largest in posterior and lateral aspects of LV IVC not well seen no respiratory variation in mitral inflow and only RA diastolic collapse not RV. Given likely diagnosis of metastatic lung cancer consider CVTS consult for pericardial window and for both Rx and diagnostic reasons . Large pericardial effusion. The pericardial effusion is circumferential.  4. The mitral valve is normal in structure. Trivial mitral  valve regurgitation. No evidence of mitral stenosis.  5. The aortic valve is tricuspid. Aortic valve regurgitation is not visualized. Mild to moderate aortic valve sclerosis/calcification is present, without any evidence of aortic stenosis.  6. The inferior vena cava is normal in size with greater than 50% respiratory variability, suggesting right atrial pressure of 3 mmHg. FINDINGS  Left Ventricle: Left ventricular ejection fraction, by estimation, is 60 to 65%. The left ventricle has normal function. The left ventricle has no regional wall motion abnormalities. The left ventricular internal cavity size was normal in size. There is  no left ventricular hypertrophy. Left ventricular diastolic parameters were normal. Right Ventricle: The right ventricular size is normal. No increase in right ventricular wall thickness. Right ventricular systolic function is normal. Left Atrium: Left atrial size was normal in size. Right Atrium: Right atrial size was normal in size. Pericardium: Large circumferential pericardial effusion largest in posterior and lateral aspects of LV IVC not well seen no respiratory variation in mitral inflow and only RA diastolic collapse not RV. Given likely diagnosis of metastatic lung cancer consider CVTS consult for pericardial window and for both Rx and diagnostic reasons. A large pericardial effusion is present. The pericardial effusion is circumferential. Mitral Valve: The mitral valve is normal in structure. There is mild thickening of the mitral valve leaflet(s). There is mild calcification of the mitral valve leaflet(s). Trivial mitral valve regurgitation. No evidence of mitral valve stenosis. Tricuspid Valve: The tricuspid valve is normal in structure. Tricuspid valve regurgitation is not demonstrated. No evidence of tricuspid stenosis. Aortic Valve: The aortic valve is tricuspid. Aortic valve regurgitation is not visualized. Mild to moderate aortic valve sclerosis/calcification is  present, without any evidence of aortic stenosis. Aortic valve mean gradient measures 4.0 mmHg. Aortic valve peak gradient measures 5.9 mmHg. Aortic valve area, by VTI measures 2.93 cm. Pulmonic Valve: The pulmonic valve was normal in structure. Pulmonic valve regurgitation is not visualized. No evidence of pulmonic stenosis. Aorta: The aortic root is normal in size and structure. Venous: The inferior vena cava is normal in size with greater than 50% respiratory variability, suggesting right atrial pressure of 3 mmHg. IAS/Shunts: No atrial level shunt detected by color flow Doppler.  LEFT VENTRICLE PLAX 2D LVIDd:         4.10 cm   Diastology LVIDs:         2.60 cm   LV e' medial:    8.55 cm/s LV PW:         1.00 cm   LV E/e' medial:  10.2 LV IVS:        0.90 cm   LV e' lateral:   8.78 cm/s LVOT diam:     2.20 cm   LV E/e'  lateral: 10.0 LV SV:         44 LV SV Index:   23 LVOT Area:     3.80 cm  RIGHT VENTRICLE RV Basal diam:  2.70 cm RV S prime:     25.50 cm/s TAPSE (M-mode): 2.8 cm LEFT ATRIUM             Index        RIGHT ATRIUM           Index LA diam:        3.00 cm 1.60 cm/m   RA Area:     13.20 cm LA Vol (A2C):   21.3 ml 11.36 ml/m  RA Volume:   32.30 ml  17.22 ml/m LA Vol (A4C):   51.5 ml 27.46 ml/m LA Biplane Vol: 33.5 ml 17.86 ml/m  AORTIC VALVE AV Area (Vmax):    2.87 cm AV Area (Vmean):   2.66 cm AV Area (VTI):     2.93 cm AV Vmax:           121.00 cm/s AV Vmean:          93.200 cm/s AV VTI:            0.149 m AV Peak Grad:      5.9 mmHg AV Mean Grad:      4.0 mmHg LVOT Vmax:         91.30 cm/s LVOT Vmean:        65.100 cm/s LVOT VTI:          0.115 m LVOT/AV VTI ratio: 0.77  AORTA Ao Root diam: 2.90 cm Ao Asc diam:  3.10 cm MITRAL VALVE               TRICUSPID VALVE MV Area (PHT): 5.34 cm    TR Peak grad:   21.5 mmHg MV Decel Time: 142 msec    TR Vmax:        232.00 cm/s MV E velocity: 87.50 cm/s MV A velocity: 78.30 cm/s  SHUNTS MV E/A ratio:  1.12        Systemic VTI:  0.12 m                             Systemic Diam: 2.20 cm Jenkins Rouge MD Electronically signed by Jenkins Rouge MD Signature Date/Time: 05/15/2021/3:57:03 PM    Final     Medications: I have reviewed the patient's current medications. Scheduled:  folic acid  1 mg Oral Daily   mometasone-formoterol  2 puff Inhalation BID   And   umeclidinium bromide  1 puff Inhalation Daily   multivitamin with minerals  1 tablet Oral Daily   pantoprazole  40 mg Oral BID   peg 3350 powder  0.5 kit Oral Once   And   [START ON 06/01/2021] peg 3350 powder  0.5 kit Oral Once   thiamine  100 mg Oral Daily   Or   thiamine  100 mg Intravenous Daily   Continuous:  sodium chloride     azithromycin 500 mg (05/23/2021 2159)   cefTRIAXone (ROCEPHIN)  IV 1 g (05/31/2021 2342)   lactated ringers 75 mL/hr at 05/10/2021 0615   AYO:[id number], albuterol, LORazepam **OR** LORazepam  Assessment/Plan: 50 year old male with history of alcohol use, tobacco use, HTN, and anxiety presents with SOB, weakness, and rectal bleeding.   Hematochezia, occasional melena Anemia Weight loss Mediastinal mass Patient presented with hematochezia and occasional melena.  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  Hemoglobin dropped from 12 on 04/15/21 to 6.9 upon presentation. Unclear whether he has a brisk upper GI bleed or lower GI bleed. No prior scopes. Patient was not able to adequately prep today. Because he is higher risk for sedation due to his invasive lung mass and would need to be intubated for his procedure, I would prefer for his colon to be fully cleaned out before proceeding with his scopes. Will push his colonoscopy to tomorrow - Trend Hb - PPI BID - Plan for EGD/colonoscopy tomorrow. CLD today. Moviprep this evening. NPO after MN except for prep.    LOS: 2 days   Sharyn Creamer 05/18/2021, 8:27 AM

## 2021-05-19 ENCOUNTER — Inpatient Hospital Stay (HOSPITAL_COMMUNITY): Payer: 59 | Admitting: Anesthesiology

## 2021-05-19 ENCOUNTER — Encounter (HOSPITAL_COMMUNITY): Admission: EM | Disposition: E | Payer: Self-pay | Source: Ambulatory Visit | Attending: Family Medicine

## 2021-05-19 ENCOUNTER — Ambulatory Visit
Admit: 2021-05-19 | Discharge: 2021-05-19 | Disposition: A | Discharge status: Home / Self Care | Payer: 59 | Attending: Radiation Oncology | Admitting: Radiation Oncology

## 2021-05-19 ENCOUNTER — Encounter (HOSPITAL_COMMUNITY): Payer: Self-pay | Admitting: Family Medicine

## 2021-05-19 ENCOUNTER — Inpatient Hospital Stay (HOSPITAL_COMMUNITY): Payer: 59

## 2021-05-19 DIAGNOSIS — I3139 Other pericardial effusion (noninflammatory): Secondary | ICD-10-CM | POA: Diagnosis not present

## 2021-05-19 DIAGNOSIS — I3131 Malignant pericardial effusion in diseases classified elsewhere: Secondary | ICD-10-CM | POA: Diagnosis not present

## 2021-05-19 DIAGNOSIS — J9859 Other diseases of mediastinum, not elsewhere classified: Secondary | ICD-10-CM | POA: Diagnosis not present

## 2021-05-19 DIAGNOSIS — R918 Other nonspecific abnormal finding of lung field: Secondary | ICD-10-CM | POA: Diagnosis not present

## 2021-05-19 DIAGNOSIS — A403 Sepsis due to Streptococcus pneumoniae: Secondary | ICD-10-CM

## 2021-05-19 DIAGNOSIS — D62 Acute posthemorrhagic anemia: Secondary | ICD-10-CM

## 2021-05-19 DIAGNOSIS — J9601 Acute respiratory failure with hypoxia: Secondary | ICD-10-CM | POA: Diagnosis not present

## 2021-05-19 DIAGNOSIS — D649 Anemia, unspecified: Secondary | ICD-10-CM

## 2021-05-19 DIAGNOSIS — C3491 Malignant neoplasm of unspecified part of right bronchus or lung: Secondary | ICD-10-CM | POA: Diagnosis not present

## 2021-05-19 DIAGNOSIS — K922 Gastrointestinal hemorrhage, unspecified: Secondary | ICD-10-CM | POA: Diagnosis not present

## 2021-05-19 DIAGNOSIS — F17210 Nicotine dependence, cigarettes, uncomplicated: Secondary | ICD-10-CM | POA: Diagnosis not present

## 2021-05-19 DIAGNOSIS — Z720 Tobacco use: Secondary | ICD-10-CM

## 2021-05-19 DIAGNOSIS — J90 Pleural effusion, not elsewhere classified: Secondary | ICD-10-CM | POA: Diagnosis not present

## 2021-05-19 DIAGNOSIS — J9811 Atelectasis: Secondary | ICD-10-CM

## 2021-05-19 HISTORY — PX: XI ROBOTIC ASSISTED PERICARDIAL WINDOW: SHX6870

## 2021-05-19 LAB — TYPE AND SCREEN
ABO/RH(D): B POS
Antibody Screen: NEGATIVE
Unit division: 0
Unit division: 0
Unit division: 0

## 2021-05-19 LAB — CBC
HCT: 26.1 % — ABNORMAL LOW (ref 39.0–52.0)
Hemoglobin: 8.4 g/dL — ABNORMAL LOW (ref 13.0–17.0)
MCH: 26.1 pg (ref 26.0–34.0)
MCHC: 32.2 g/dL (ref 30.0–36.0)
MCV: 81.1 fL (ref 80.0–100.0)
Platelets: 455 10*3/uL — ABNORMAL HIGH (ref 150–400)
RBC: 3.22 MIL/uL — ABNORMAL LOW (ref 4.22–5.81)
RDW: 18.5 % — ABNORMAL HIGH (ref 11.5–15.5)
WBC: 30.4 10*3/uL — ABNORMAL HIGH (ref 4.0–10.5)
nRBC: 0.1 % (ref 0.0–0.2)

## 2021-05-19 LAB — BASIC METABOLIC PANEL
Anion gap: 12 (ref 5–15)
BUN: 12 mg/dL (ref 6–20)
CO2: 17 mmol/L — ABNORMAL LOW (ref 22–32)
Calcium: 8.4 mg/dL — ABNORMAL LOW (ref 8.9–10.3)
Chloride: 106 mmol/L (ref 98–111)
Creatinine, Ser: 1.27 mg/dL — ABNORMAL HIGH (ref 0.61–1.24)
GFR, Estimated: >60 mL/min (ref >60)
Glucose, Bld: 101 mg/dL — ABNORMAL HIGH (ref 70–99)
Potassium: 3.4 mmol/L — ABNORMAL LOW (ref 3.5–5.1)
Sodium: 135 mmol/L (ref 135–145)

## 2021-05-19 LAB — BPAM RBC
Blood Product Expiration Date: 202210252359
Blood Product Expiration Date: 202211062359
Blood Product Expiration Date: 202211122359
ISSUE DATE / TIME: 202210141611
ISSUE DATE / TIME: 202210150216
ISSUE DATE / TIME: 202210160358
Unit Type and Rh: 7300
Unit Type and Rh: 7300
Unit Type and Rh: 7300

## 2021-05-19 LAB — HEMOGLOBIN AND HEMATOCRIT, BLOOD
HCT: 24.3 % — ABNORMAL LOW (ref 39.0–52.0)
Hemoglobin: 8 g/dL — ABNORMAL LOW (ref 13.0–17.0)

## 2021-05-19 SURGERY — CREATION, PERICARDIAL WINDOW, ROBOT-ASSISTED
Anesthesia: General | Laterality: Left

## 2021-05-19 MED ORDER — PHENYLEPHRINE 40 MCG/ML (10ML) SYRINGE FOR IV PUSH (FOR BLOOD PRESSURE SUPPORT)
PREFILLED_SYRINGE | INTRAVENOUS | Status: DC | PRN
  Administered 2021-05-19: 80 ug via INTRAVENOUS

## 2021-05-19 MED ORDER — BUPIVACAINE HCL (PF) 0.5 % IJ SOLN
INTRAMUSCULAR | Status: AC
  Filled 2021-05-19: qty 30

## 2021-05-19 MED ORDER — ROCURONIUM BROMIDE 10 MG/ML (PF) SYRINGE
PREFILLED_SYRINGE | INTRAVENOUS | Status: AC
  Filled 2021-05-19: qty 10

## 2021-05-19 MED ORDER — PHENYLEPHRINE HCL-NACL 20-0.9 MG/250ML-% IV SOLN
INTRAVENOUS | Status: DC | PRN
  Administered 2021-05-19: 40 ug/min via INTRAVENOUS

## 2021-05-19 MED ORDER — SUGAMMADEX SODIUM 200 MG/2ML IV SOLN
INTRAVENOUS | Status: DC | PRN
  Administered 2021-05-19: 200 mg via INTRAVENOUS

## 2021-05-19 MED ORDER — 0.9 % SODIUM CHLORIDE (POUR BTL) OPTIME
TOPICAL | Status: DC | PRN
  Administered 2021-05-19: 2000 mL

## 2021-05-19 MED ORDER — MIDAZOLAM HCL 2 MG/2ML IJ SOLN
INTRAMUSCULAR | Status: AC
  Filled 2021-05-19: qty 2

## 2021-05-19 MED ORDER — BUPIVACAINE LIPOSOME 1.3 % IJ SUSP
INTRAMUSCULAR | Status: AC
  Filled 2021-05-19: qty 20

## 2021-05-19 MED ORDER — PROPOFOL 10 MG/ML IV BOLUS
INTRAVENOUS | Status: DC | PRN
  Administered 2021-05-19 (×2): 50 mg via INTRAVENOUS

## 2021-05-19 MED ORDER — MELATONIN 5 MG PO TABS
5.0000 mg | ORAL_TABLET | Freq: Every evening | ORAL | Status: DC | PRN
  Administered 2021-05-19: 5 mg via ORAL
  Filled 2021-05-19: qty 1

## 2021-05-19 MED ORDER — POLYETHYLENE GLYCOL 3350 17 GM/SCOOP PO POWD
0.5000 | Freq: Once | ORAL | Status: DC
  Filled 2021-05-19: qty 255

## 2021-05-19 MED ORDER — ROCURONIUM BROMIDE 10 MG/ML (PF) SYRINGE
PREFILLED_SYRINGE | INTRAVENOUS | Status: DC | PRN
  Administered 2021-05-19: 10 mg via INTRAVENOUS
  Administered 2021-05-19: 60 mg via INTRAVENOUS

## 2021-05-19 MED ORDER — FENTANYL CITRATE (PF) 250 MCG/5ML IJ SOLN
INTRAMUSCULAR | Status: AC
  Filled 2021-05-19: qty 5

## 2021-05-19 MED ORDER — AMISULPRIDE (ANTIEMETIC) 5 MG/2ML IV SOLN: 5.0000 mg | Freq: Once | INTRAVENOUS | Status: DC | PRN

## 2021-05-19 MED ORDER — OXYCODONE HCL 5 MG/5ML PO SOLN: 5.0000 mg | Freq: Once | ORAL | Status: DC | PRN

## 2021-05-19 MED ORDER — DEXAMETHASONE SODIUM PHOSPHATE 10 MG/ML IJ SOLN
INTRAMUSCULAR | Status: AC
  Filled 2021-05-19: qty 1

## 2021-05-19 MED ORDER — CHLORHEXIDINE GLUCONATE 0.12 % MT SOLN: 15.0000 mL | Freq: Once | OROMUCOSAL | Status: AC

## 2021-05-19 MED ORDER — COLCHICINE 0.6 MG PO TABS
0.6000 mg | ORAL_TABLET | Freq: Every day | ORAL | Status: DC
  Administered 2021-05-19 – 2021-05-23 (×5): 0.6 mg via ORAL
  Filled 2021-05-19 (×5): qty 1

## 2021-05-19 MED ORDER — ORAL CARE MOUTH RINSE: 15.0000 mL | Freq: Once | OROMUCOSAL | Status: AC

## 2021-05-19 MED ORDER — CHLORHEXIDINE GLUCONATE 0.12 % MT SOLN
OROMUCOSAL | Status: AC
  Administered 2021-05-19: 15 mL via OROMUCOSAL
  Filled 2021-05-19: qty 15

## 2021-05-19 MED ORDER — TRAMADOL HCL 50 MG PO TABS
50.0000 mg | ORAL_TABLET | Freq: Four times a day (QID) | ORAL | Status: DC
  Administered 2021-05-19 – 2021-05-31 (×39): 50 mg via ORAL
  Filled 2021-05-19 (×41): qty 1

## 2021-05-19 MED ORDER — FENTANYL CITRATE (PF) 100 MCG/2ML IJ SOLN: 25.0000 ug | INTRAMUSCULAR | Status: DC | PRN

## 2021-05-19 MED ORDER — LIDOCAINE 2% (20 MG/ML) 5 ML SYRINGE
INTRAMUSCULAR | Status: DC | PRN
Start: 2021-05-19 — End: 2021-05-19
  Administered 2021-05-19: 60 mg via INTRAVENOUS

## 2021-05-19 MED ORDER — ONDANSETRON HCL 4 MG/2ML IJ SOLN
INTRAMUSCULAR | Status: AC
  Filled 2021-05-19: qty 2

## 2021-05-19 MED ORDER — POTASSIUM CHLORIDE CRYS ER 20 MEQ PO TBCR
60.0000 meq | EXTENDED_RELEASE_TABLET | Freq: Once | ORAL | Status: AC
  Administered 2021-05-19: 60 meq via ORAL
  Filled 2021-05-19: qty 3

## 2021-05-19 MED ORDER — DEXAMETHASONE SODIUM PHOSPHATE 10 MG/ML IJ SOLN
INTRAMUSCULAR | Status: DC | PRN
Start: 2021-05-19 — End: 2021-05-19
  Administered 2021-05-19: 10 mg via INTRAVENOUS

## 2021-05-19 MED ORDER — PROMETHAZINE HCL 25 MG/ML IJ SOLN: 6.2500 mg | INTRAMUSCULAR | Status: DC | PRN

## 2021-05-19 MED ORDER — VASOPRESSIN 20 UNIT/ML IV SOLN
INTRAVENOUS | Status: AC
  Filled 2021-05-19: qty 1

## 2021-05-19 MED ORDER — MORPHINE SULFATE (PF) 2 MG/ML IV SOLN
2.0000 mg | INTRAVENOUS | Status: DC | PRN
  Administered 2021-05-19 – 2021-06-06 (×8): 2 mg via INTRAVENOUS
  Filled 2021-05-19 (×8): qty 1

## 2021-05-19 MED ORDER — LIDOCAINE 2% (20 MG/ML) 5 ML SYRINGE
INTRAMUSCULAR | Status: AC
  Filled 2021-05-19: qty 5

## 2021-05-19 MED ORDER — ACETAMINOPHEN 10 MG/ML IV SOLN: 1000.0000 mg | Freq: Once | INTRAVENOUS | Status: DC | PRN

## 2021-05-19 MED ORDER — FENTANYL CITRATE (PF) 250 MCG/5ML IJ SOLN
INTRAMUSCULAR | Status: DC | PRN
  Administered 2021-05-19: 150 ug via INTRAVENOUS
  Administered 2021-05-19 (×2): 100 ug via INTRAVENOUS

## 2021-05-19 MED ORDER — PROPOFOL 10 MG/ML IV BOLUS
INTRAVENOUS | Status: AC
  Filled 2021-05-19: qty 20

## 2021-05-19 MED ORDER — AMISULPRIDE (ANTIEMETIC) 5 MG/2ML IV SOLN: 10.0000 mg | Freq: Once | INTRAVENOUS | Status: DC | PRN

## 2021-05-19 MED ORDER — OXYCODONE HCL 5 MG PO TABS: 5.0000 mg | ORAL_TABLET | Freq: Once | ORAL | Status: DC | PRN

## 2021-05-19 MED ORDER — LACTATED RINGERS IV SOLN: INTRAVENOUS | Status: DC

## 2021-05-19 MED ORDER — ONDANSETRON HCL 4 MG/2ML IJ SOLN
INTRAMUSCULAR | Status: DC | PRN
  Administered 2021-05-19: 4 mg via INTRAVENOUS

## 2021-05-19 MED ORDER — MIDAZOLAM HCL 2 MG/2ML IJ SOLN
INTRAMUSCULAR | Status: DC | PRN
  Administered 2021-05-19: 2 mg via INTRAVENOUS

## 2021-05-19 MED ORDER — BUPIVACAINE LIPOSOME 1.3 % IJ SUSP
INTRAMUSCULAR | Status: DC | PRN
  Administered 2021-05-19: 50 mL

## 2021-05-19 SURGICAL SUPPLY — 57 items
BLADE STERNUM SYSTEM 6 (BLADE) IMPLANT
CONNECTOR BLAKE 2:1 CARIO BLK (MISCELLANEOUS) IMPLANT
DEFOGGER SCOPE WARMER CLEARIFY (MISCELLANEOUS) ×2 IMPLANT
DERMABOND ADVANCED (GAUZE/BANDAGES/DRESSINGS) ×1
DERMABOND ADVANCED .7 DNX12 (GAUZE/BANDAGES/DRESSINGS) ×1 IMPLANT
DRAIN CHANNEL 19F RND (DRAIN) IMPLANT
DRAPE ARM DVNC X/XI (DISPOSABLE) ×4 IMPLANT
DRAPE COLUMN DVNC XI (DISPOSABLE) ×1 IMPLANT
DRAPE CV SPLIT W-CLR ANES SCRN (DRAPES) ×2 IMPLANT
DRAPE DA VINCI XI ARM (DISPOSABLE) ×4
DRAPE DA VINCI XI COLUMN (DISPOSABLE) ×1
DRAPE ORTHO SPLIT 77X108 STRL (DRAPES) ×1
DRAPE SURG ORHT 6 SPLT 77X108 (DRAPES) ×1 IMPLANT
DRSG AQUACEL AG ADV 3.5X14 (GAUZE/BANDAGES/DRESSINGS) IMPLANT
ELECT REM PT RETURN 9FT ADLT (ELECTROSURGICAL)
ELECTRODE REM PT RTRN 9FT ADLT (ELECTROSURGICAL) IMPLANT
EVACUATOR SILICONE 100CC (DRAIN) IMPLANT
FELT TEFLON 1X6 (MISCELLANEOUS) IMPLANT
GAUZE 4X4 16PLY ~~LOC~~+RFID DBL (SPONGE) ×2 IMPLANT
GAUZE SPONGE 4X4 12PLY STRL (GAUZE/BANDAGES/DRESSINGS) IMPLANT
GLOVE SURG ENC MOIS LTX SZ7 (GLOVE) ×4 IMPLANT
GOWN STRL REUS W/ TWL LRG LVL3 (GOWN DISPOSABLE) ×1 IMPLANT
GOWN STRL REUS W/ TWL XL LVL3 (GOWN DISPOSABLE) ×1 IMPLANT
GOWN STRL REUS W/TWL LRG LVL3 (GOWN DISPOSABLE) ×1
GOWN STRL REUS W/TWL XL LVL3 (GOWN DISPOSABLE) ×1
HEMOSTAT POWDER SURGIFOAM 1G (HEMOSTASIS) IMPLANT
KIT SUCTION CATH 14FR (SUCTIONS) IMPLANT
NEEDLE HYPO 25GX1X1/2 BEV (NEEDLE) IMPLANT
PACK CHEST (CUSTOM PROCEDURE TRAY) IMPLANT
PAD ARMBOARD 7.5X6 YLW CONV (MISCELLANEOUS) ×4 IMPLANT
PAD ELECT DEFIB RADIOL ZOLL (MISCELLANEOUS) ×2 IMPLANT
RETRACTOR WOUND ALXS 19CM XSML (INSTRUMENTS) ×1 IMPLANT
RTRCTR WOUND ALEXIS 19CM XSML (INSTRUMENTS) ×2
SEAL CANN UNIV 5-8 DVNC XI (MISCELLANEOUS) ×3 IMPLANT
SEAL XI 5MM-8MM UNIVERSAL (MISCELLANEOUS) ×3
SET TRI-LUMEN FLTR TB AIRSEAL (TUBING) ×2 IMPLANT
SPONGE TONSIL 1 RF SGL (DISPOSABLE) ×2 IMPLANT
STOPCOCK 4 WAY LG BORE MALE ST (IV SETS) IMPLANT
SUT BONE WAX W31G (SUTURE) IMPLANT
SUT MNCRL AB 3-0 PS2 18 (SUTURE) IMPLANT
SUT PDS AB 1 CTX 36 (SUTURE) IMPLANT
SUT SILK  1 MH (SUTURE) ×2
SUT SILK 1 MH (SUTURE) ×2 IMPLANT
SUT SILK 2 0 SH CR/8 (SUTURE) IMPLANT
SUT STEEL 6MS V (SUTURE) IMPLANT
SUT STEEL SZ 6 DBL 3X14 BALL (SUTURE) IMPLANT
SUT VIC AB 2-0 CTX 36 (SUTURE) IMPLANT
SUT VIC AB 3-0 SH 27 (SUTURE) ×1
SUT VIC AB 3-0 SH 27X BRD (SUTURE) ×1 IMPLANT
SUT VICRYL 0 UR6 27IN ABS (SUTURE) ×4 IMPLANT
SYR 50ML LL SCALE MARK (SYRINGE) IMPLANT
SYSTEM RETRIEVAL ANCHOR 8 (MISCELLANEOUS) IMPLANT
SYSTEM SAHARA CHEST DRAIN ATS (WOUND CARE) IMPLANT
TOWEL GREEN STERILE (TOWEL DISPOSABLE) IMPLANT
TRAP SPECIMEN MUCUS 40CC (MISCELLANEOUS) ×4 IMPLANT
TRAY FOLEY SLVR 16FR TEMP STAT (SET/KITS/TRAYS/PACK) IMPLANT
TUBING EXTENTION W/L.L. (IV SETS) IMPLANT

## 2021-05-19 NOTE — Anesthesia Procedure Notes (Signed)
Procedure Name: Intubation Date/Time: 05/24/2021 1:56 PM Performed by: Ardyth Harps, CRNA Pre-anesthesia Checklist: Patient identified, Emergency Drugs available, Suction available and Patient being monitored Patient Re-evaluated:Patient Re-evaluated prior to induction Oxygen Delivery Method: Circle System Utilized Preoxygenation: Pre-oxygenation with 100% oxygen Induction Type: IV induction Ventilation: Mask ventilation without difficulty Laryngoscope Size: Mac and 4 Grade View: Grade I Tube type: Oral Tube size: 7.5 mm Number of attempts: 1 Airway Equipment and Method: Stylet Placement Confirmation: ETT inserted through vocal cords under direct vision, positive ETCO2 and breath sounds checked- equal and bilateral Secured at: 24 cm Tube secured with: Tape Dental Injury: Teeth and Oropharynx as per pre-operative assessment

## 2021-05-19 NOTE — Progress Notes (Addendum)
Name: Christopher Henry MRN: [id number] DOB: January 12, 1971    ADMISSION DATE:  05/14/2021 CONSULTATION DATE:  05/27/2021   REFERRING MD :  Tegeler, EDP   CHIEF COMPLAINT: Hemoptysis, bright red blood per rectum   HISTORY OF PRESENT ILLNESS: 50 year old heavy smoker with COPD, seen by my partner Dr. Loanne Drilling in the pulmonary office and sent to the emergency room.  He reports hemoptysis for the past 6 days, increase shortness of breath and wheezing, unable to ambulate short distances.  He was noted to be bradycardic in the 50s in the office with saturation 80% on room air, placed on 3 L nasal cannula and referred to the emergency room.  CT angiogram chest showed no PE but showed mediastinal mass infiltrating into the carina with mediastinal lymphadenopathy including a 2.7 cm right paratracheal lymph node.  There was stenosis of the right main pulmonary artery there was a 1.7 cm groundglass nodule at the right apex and there was complete collapse of the right lower lobe.  There was a small volume pericardial effusion On further questioning, he reports shortness of breath ongoing for 3 months, 50 pound weight loss over the past 2 years. He also reported bright red blood per rectum, painless and sometimes without his knowledge which is not characteristic of his known hemorrhoids   Labs in the ED showed mild hyponatremia, leukocytosis 30 1K, hemoglobin of 6.9   Significant tests/ events reviewed 10/14 transfused 2 units PRBC 10/15 Bronchoscopy showed large subcarinal mass indenting the carina and bulging and bilaterally with large mainstem lesion obscuring 50% of lumen.  Biopsies taken with cryo and hemostasis achieved >>radiation oncology consult  PAST MEDICAL HISTORY :  Past Medical History:  Diagnosis Date   Anxiety state, unspecified 11/09/2012   Hypertension    SUBJECTIVE:  Currently going for drainage of pericardial effusion VITAL SIGNS: Temp:  [98 F (36.7 C)-101.1 F (38.4 C)] 98.7 F  (37.1 C) (10/17 1115) Pulse Rate:  [100-120] 113 (10/17 1123) Resp:  [19-20] 19 (10/17 1123) BP: (112-142)/(70-94) 112/93 (10/17 1123) SpO2:  [92 %-99 %] 96 % (10/17 1123)  PHYSICAL EXAMINATION: General: Thin male no acute distress HEENT: MM pink/moist no ymphadenopathy is appreciated Neuro: Dull effect but grossly intact CV: Heart sounds are regular PULM: Throughout  GI: soft, bsx4 active  GU: Voids Extremities: warm/dry,  edema  Skin: no rashes or lesions  Skin:  Warm, no lesions/ rash   Recent Labs  Lab 05/18/2021 0743 05/18/21 0221 05/28/2021 0430  NA 136 134* 135  K 4.0 4.0 3.4*  CL 105 105 106  CO2 19* 20* 17*  BUN 27* 20 12  CREATININE 1.22 1.29* 1.27*  GLUCOSE 104* 107* 101*   Recent Labs  Lab 05/07/2021 0743 06/01/2021 1413 05/18/21 0221 05/18/21 0947 05/18/21 1958 05/07/2021 0430 05/10/2021 0832  HGB 7.8*   < > 6.8*   < > 7.9* 8.4* 8.0*  HCT 23.0*   < > 20.9*   < > 23.7* 26.1* 24.3*  WBC 25.3*  --  25.5*  --   --  30.4*  --   PLT 393  --  389  --   --  455*  --    < > = values in this interval not displayed.   ECHOCARDIOGRAM COMPLETE  Result Date: 05/31/2021    ECHOCARDIOGRAM REPORT   Patient Name:   Christopher Henry Date of Exam: 05/05/2021 Medical Rec #:  [id number]    Height:       76.0 in Accession #:  [id number]   Weight:       135.1 lb Date of Birth:  [birth year]     BSA:          1.875 m Patient Age:    14 years     BP:           108/78 mmHg Patient Gender: M            HR:           107 bpm. Exam Location:  Inpatient Procedure: 2D Echo and 3D Echo Indications:    Pericardial effusion  History:        Patient has no prior history of Echocardiogram examinations.                 COPD; Risk Factors:Current Smoker. ETOH abuse.  Sonographer:    Clayton Lefort RDCS (AE) Referring Phys: [phone] RAKESH V ALVA  Sonographer Comments: Suboptimal subcostal window. IMPRESSIONS  1. Left ventricular ejection fraction, by estimation, is 60 to 65%. The left ventricle has normal function.  The left ventricle has no regional wall motion abnormalities. Left ventricular diastolic parameters were normal.  2. Right ventricular systolic function is normal. The right ventricular size is normal.  3. Large circumferential pericardial effusion largest in posterior and lateral aspects of LV IVC not well seen no respiratory variation in mitral inflow and only RA diastolic collapse not RV. Given likely diagnosis of metastatic lung cancer consider CVTS consult for pericardial window and for both Rx and diagnostic reasons . Large pericardial effusion. The pericardial effusion is circumferential.  4. The mitral valve is normal in structure. Trivial mitral valve regurgitation. No evidence of mitral stenosis.  5. The aortic valve is tricuspid. Aortic valve regurgitation is not visualized. Mild to moderate aortic valve sclerosis/calcification is present, without any evidence of aortic stenosis.  6. The inferior vena cava is normal in size with greater than 50% respiratory variability, suggesting right atrial pressure of 3 mmHg. FINDINGS  Left Ventricle: Left ventricular ejection fraction, by estimation, is 60 to 65%. The left ventricle has normal function. The left ventricle has no regional wall motion abnormalities. The left ventricular internal cavity size was normal in size. There is  no left ventricular hypertrophy. Left ventricular diastolic parameters were normal. Right Ventricle: The right ventricular size is normal. No increase in right ventricular wall thickness. Right ventricular systolic function is normal. Left Atrium: Left atrial size was normal in size. Right Atrium: Right atrial size was normal in size. Pericardium: Large circumferential pericardial effusion largest in posterior and lateral aspects of LV IVC not well seen no respiratory variation in mitral inflow and only RA diastolic collapse not RV. Given likely diagnosis of metastatic lung cancer consider CVTS consult for pericardial window and for  both Rx and diagnostic reasons. A large pericardial effusion is present. The pericardial effusion is circumferential. Mitral Valve: The mitral valve is normal in structure. There is mild thickening of the mitral valve leaflet(s). There is mild calcification of the mitral valve leaflet(s). Trivial mitral valve regurgitation. No evidence of mitral valve stenosis. Tricuspid Valve: The tricuspid valve is normal in structure. Tricuspid valve regurgitation is not demonstrated. No evidence of tricuspid stenosis. Aortic Valve: The aortic valve is tricuspid. Aortic valve regurgitation is not visualized. Mild to moderate aortic valve sclerosis/calcification is present, without any evidence of aortic stenosis. Aortic valve mean gradient measures 4.0 mmHg. Aortic valve peak gradient measures 5.9 mmHg. Aortic valve area, by VTI measures 2.93 cm. Pulmonic Valve: The pulmonic  valve was normal in structure. Pulmonic valve regurgitation is not visualized. No evidence of pulmonic stenosis. Aorta: The aortic root is normal in size and structure. Venous: The inferior vena cava is normal in size with greater than 50% respiratory variability, suggesting right atrial pressure of 3 mmHg. IAS/Shunts: No atrial level shunt detected by color flow Doppler.  LEFT VENTRICLE PLAX 2D LVIDd:         4.10 cm   Diastology LVIDs:         2.60 cm   LV e' medial:    8.55 cm/s LV PW:         1.00 cm   LV E/e' medial:  10.2 LV IVS:        0.90 cm   LV e' lateral:   8.78 cm/s LVOT diam:     2.20 cm   LV E/e' lateral: 10.0 LV SV:         44 LV SV Index:   23 LVOT Area:     3.80 cm  RIGHT VENTRICLE RV Basal diam:  2.70 cm RV S prime:     25.50 cm/s TAPSE (M-mode): 2.8 cm LEFT ATRIUM             Index        RIGHT ATRIUM           Index LA diam:        3.00 cm 1.60 cm/m   RA Area:     13.20 cm LA Vol (A2C):   21.3 ml 11.36 ml/m  RA Volume:   32.30 ml  17.22 ml/m LA Vol (A4C):   51.5 ml 27.46 ml/m LA Biplane Vol: 33.5 ml 17.86 ml/m  AORTIC VALVE AV  Area (Vmax):    2.87 cm AV Area (Vmean):   2.66 cm AV Area (VTI):     2.93 cm AV Vmax:           121.00 cm/s AV Vmean:          93.200 cm/s AV VTI:            0.149 m AV Peak Grad:      5.9 mmHg AV Mean Grad:      4.0 mmHg LVOT Vmax:         91.30 cm/s LVOT Vmean:        65.100 cm/s LVOT VTI:          0.115 m LVOT/AV VTI ratio: 0.77  AORTA Ao Root diam: 2.90 cm Ao Asc diam:  3.10 cm MITRAL VALVE               TRICUSPID VALVE MV Area (PHT): 5.34 cm    TR Peak grad:   21.5 mmHg MV Decel Time: 142 msec    TR Vmax:        232.00 cm/s MV E velocity: 87.50 cm/s MV A velocity: 78.30 cm/s  SHUNTS MV E/A ratio:  1.12        Systemic VTI:  0.12 m                            Systemic Diam: 2.20 cm Jenkins Rouge MD Electronically signed by Jenkins Rouge MD Signature Date/Time: 05/11/2021/3:57:03 PM    Final     ASSESSMENT / PLAN:  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 At some point you need to transfer to [facility] for radiation therapy Status post fiberoptic bronchoscopy with cultures and pathology still pending at this time Will need PET scan as outpatient   Large pericardial effusion confirmed on echo with RA collapse 05/07/2021 1100 hrs. currently having pericardial effusion drained  Postobstructive/right lower lobe pneumonia -Treat with empiric antibiotics  Acute/chronic hypoxic respiratory failure -oxygen as needed Bronchodilators  Hematochezia/iron deficiency anemia- Per GI PCCM will follow along   Petronila Acute Care Nurse Practitioner Maryanna Shape Pulmonary/Critical Care Please consult Amion 05/23/2021, 11:52 AM

## 2021-05-19 NOTE — Anesthesia Preprocedure Evaluation (Addendum)
Anesthesia Evaluation  Patient identified by MRN, date of birth, ID band Patient awake    Reviewed: Allergy & Precautions, NPO status , Patient's Chart, lab work & pertinent test results  Airway Mallampati: III  TM Distance: >3 FB Neck ROM: Full    Dental  (+) Missing, Chipped   Pulmonary COPD,  COPD inhaler, former smoker,    Pulmonary exam normal breath sounds clear to auscultation       Cardiovascular hypertension, Pt. on medications Normal cardiovascular exam Rhythm:Regular Rate:Normal  ECHO: Left ventricular ejection fraction, by estimation, is 60 to 65%. The left ventricle has normal function. The left ventricle has no regional wall motion abnormalities. Left ventricular diastolic parameters were normal. Right ventricular systolic function is normal. The right ventricular size is normal. Large circumferential pericardial effusion largest in posterior and lateral aspects of LV IVC not well seen no respiratory variation in mitral inflow and only RA diastolic collapse not RV. Given likely diagnosis of metastatic lung cancer consider CVTS consult for pericardial window and for both Rx and diagnostic reasons . Large pericardial effusion. The pericardial effusion is circumferential. 3. The mitral valve is normal in structure. Trivial mitral valve regurgitation. No evidence of mitral stenosis. The aortic valve is tricuspid. Aortic valve regurgitation is not visualized. Mild to moderate aortic valve sclerosis/calcification is present, without any evidence of aortic stenosis. The inferior vena cava is normal in size with greater than 50% respiratory variability, suggesting right atrial pressure of 3 mmHg.   Neuro/Psych Anxiety negative neurological ROS     GI/Hepatic negative GI ROS, Neg liver ROS,   Endo/Other  negative endocrine ROS  Renal/GU Renal disease     Musculoskeletal negative musculoskeletal ROS (+)    Abdominal   Peds  Hematology  (+) anemia ,   Anesthesia Other Findings Pericardial effusion  Reproductive/Obstetrics                           Anesthesia Physical Anesthesia Plan  ASA: 4  Anesthesia Plan: General   Post-op Pain Management:    Induction: Intravenous  PONV Risk Score and Plan: 2 and Ondansetron, Dexamethasone, Midazolam and Treatment may vary due to age or medical condition  Airway Management Planned: Oral ETT and Double Lumen EBT  Additional Equipment: Arterial line  Intra-op Plan:   Post-operative Plan: Extubation in OR  Informed Consent: I have reviewed the patients History and Physical, chart, labs and discussed the procedure including the risks, benefits and alternatives for the proposed anesthesia with the patient or authorized representative who has indicated his/her understanding and acceptance.     Dental advisory given  Plan Discussed with: CRNA  Anesthesia Plan Comments: (Potential central line placement discussed)      Anesthesia Quick Evaluation

## 2021-05-19 NOTE — Consult Note (Signed)
[address]       [place] [address]             [phone]                    Deontra Wyre Vilas Medical Record #[id number] Date of Birth: [birth year]  Referring: No ref. provider found Primary Care: Bary Castilla, NP Primary Cardiologist: Evalina Field, MD  Chief Complaint:    Chief Complaint  Patient presents with   Shortness of Breath    History of Present Illness:    Christopher Henry 50 y.o. male admitted with shortness of breath.  Cross-sectional imaging showed is a right-sided potential lung cancer with mediastinal involvement and adenopathy.  He was also noted to have a circumferential pericardial effusion.  An echocardiogram was performed which did show some compression of the right atrium but no obvious tamponade physiology.  Cardiology was consulted, but there was no apparent window for pericardial drain placement.  CTS was consulted to assist with care for this patient.         Past Medical History:  Diagnosis Date   Anxiety state, unspecified 11/09/2012   Hypertension     Past Surgical History:  Procedure Laterality Date   BRONCHIAL BRUSHINGS  05/18/2021   Procedure: BRONCHIAL BRUSHINGS;  Surgeon: Rigoberto Noel, MD;  Location: [facility] ENDOSCOPY;  Service: Cardiopulmonary;;   BRONCHIAL WASHINGS  05/15/2021   Procedure: BRONCHIAL WASHINGS;  Surgeon: Rigoberto Noel, MD;  Location: [facility] ENDOSCOPY;  Service: Cardiopulmonary;;   CRYOTHERAPY  05/13/2021   Procedure: CRYOTHERAPY;  Surgeon: Rigoberto Noel, MD;  Location: [facility] ENDOSCOPY;  Service: Cardiopulmonary;;   FINGER SURGERY     HEMOSTASIS CONTROL  05/18/2021   Procedure: HEMOSTASIS CONTROL;  Surgeon: Rigoberto Noel, MD;  Location: [facility];  Service: Cardiopulmonary;;   HIP SURGERY     VIDEO BRONCHOSCOPY Bilateral 05/08/2021   Procedure: VIDEO BRONCHOSCOPY WITH  BIOPSY;  Surgeon: Rigoberto Noel, MD;  Location: [facility];  Service: Cardiopulmonary;  Laterality: Bilateral;     Family History  Problem Relation Age of Onset   Hypertension Brother    Cancer Maternal Grandmother        unknown to pt     Social History   Tobacco Use  Smoking Status Former   Packs/day: 2.00   Years: 35.00   Pack years: 70.00   Types: Cigarettes   Quit date: 05/12/2021   Years since quitting: 0.0  Smokeless Tobacco Never    Social History   Substance and Sexual Activity  Alcohol Use Yes   Comment: 1 pint a day     No Known Allergies  Current Facility-Administered Medications  Medication Dose Route Frequency Provider Last Rate Last Admin   (feeding supplement) PROSource Plus liquid 30 mL  30 mL Oral BID BM Kc, Ramesh, MD       0.9 %  sodium chloride infusion  10 mL/hr Intravenous Once Coralee Pesa, MD       acetaminophen (TYLENOL) 160 MG/5ML solution 650 mg  650 mg Oral Q6H PRN Kc, Ramesh, MD       albuterol (PROVENTIL) (2.5 MG/3ML) 0.083% nebulizer solution 2.5 mg  2.5 mg Nebulization Q4H PRN Orma Flaming, MD       azithromycin (ZITHROMAX) 500 mg in sodium chloride 0.9 % 250 mL IVPB  500 mg Intravenous Q24H Orma Flaming, MD 250 mL/hr at 05/18/21 2230 500 mg at 05/18/21 2230   cefTRIAXone (ROCEPHIN)  1 g in sodium chloride 0.9 % 100 mL IVPB  1 g Intravenous Q24H Orma Flaming, MD 200 mL/hr at 05/18/21 2215 1 g at 05/18/21 2215   feeding supplement (BOOST / RESOURCE BREEZE) liquid 1 Container  1 Container Oral TID BM Antonieta Pert, MD   1 Container at 50/27/74 1287   folic acid (FOLVITE) tablet 1 mg  1 mg Oral Daily Irene Pap N, DO   1 mg at 05/18/21 8676   lactated ringers infusion   Intravenous Continuous Orma Flaming, MD 75 mL/hr at 05/23/2021 0615 Restarted at 05/06/2021 0615   LORazepam (ATIVAN) tablet 1-4 mg  1-4 mg Oral Q1H PRN Kayleen Memos, DO       Or   LORazepam (ATIVAN) injection 1-4 mg  1-4 mg Intravenous Q1H PRN Irene Pap N, DO       mometasone-formoterol (DULERA) 200-5 MCG/ACT inhaler 2 puff  2 puff Inhalation BID Orma Flaming, MD   2  puff at 05/18/21 2007   And   umeclidinium bromide (INCRUSE ELLIPTA) 62.5 MCG/INH 1 puff  1 puff Inhalation Daily Orma Flaming, MD   1 puff at 05/18/21 0905   multivitamin with minerals tablet 1 tablet  1 tablet Oral Daily Kayleen Memos, DO   1 tablet at 05/18/21 0902   pantoprazole (PROTONIX) EC tablet 40 mg  40 mg Oral BID Sharyn Creamer, MD   40 mg at 05/18/21 2212   thiamine tablet 100 mg  100 mg Oral Daily Kayleen Memos, DO   100 mg at 05/18/21 7209   Or   thiamine (B-1) injection 100 mg  100 mg Intravenous Daily Kayleen Memos, DO        Review of Systems  Respiratory:  Positive for cough and shortness of breath.   Cardiovascular:  Positive for palpitations.    PHYSICAL EXAMINATION: BP (!) 142/92 (BP Location: Right Arm)   Pulse 100   Temp 98.7 F (37.1 C) (Oral)   Resp 20   Wt 61.3 kg   SpO2 99%   BMI 16.45 kg/m  Physical Exam Constitutional:      General: He is not in acute distress.    Appearance: He is ill-appearing. He is not toxic-appearing or diaphoretic.  Eyes:     Extraocular Movements: Extraocular movements intact.  Cardiovascular:     Rate and Rhythm: Tachycardia present.  Pulmonary:     Effort: Pulmonary effort is normal. No tachypnea.  Skin:    Nails: There is clubbing.  Neurological:     General: No focal deficit present.     Mental Status: He is alert.    Diagnostic Studies & Laboratory data:     Recent Radiology Findings:   DG Chest 2 View  Result Date: 05/26/2021 CLINICAL DATA:  Shortness of breath EXAM: CHEST - 2 VIEW COMPARISON:  Radiograph 04/15/2021 FINDINGS: Unchanged cardiomediastinal silhouette. New right lower lobe airspace disease. No large pleural effusion or visible pneumothorax. There is prominence of the azygous vein and right paratracheal stripe. There is an unchanged compression deformity in midthoracic spine. No acute osseous abnormality. IMPRESSION: Right lower lobe pneumonia. Recommend follow-up chest radiograph in 6-12  weeks to ensure resolution. Electronically Signed   By: Maurine Simmering M.D.   On: 05/26/2021 14:21   CT Angio Chest PE W and/or Wo Contrast  Result Date: 05/18/2021 CLINICAL DATA:  Pt here via EMS with c/o SOB with exertion. History of chronic bronchitis. Pt at PCP with SpO2 of 80. Placed on 2  liters with improvement of 100%. Chest tingling. Pt reports rectal bleeding X1 week EXAM: CT ANGIOGRAPHY CHEST WITH CONTRAST TECHNIQUE: Multidetector CT imaging of the chest was performed using the standard protocol during bolus administration of intravenous contrast. Multiplanar CT image reconstructions and MIPs were obtained to evaluate the vascular anatomy. CONTRAST:  78mL OMNIPAQUE IOHEXOL 350 MG/ML SOLN COMPARISON:  Chest x-ray 05/21/2021, chest x-ray 04/15/2021 FINDINGS: Cardiovascular: Satisfactory opacification of the pulmonary arteries to the segmental level. No evidence of pulmonary embolism. The main pulmonary artery is normal in caliber. There is narrowing but no definite significant stenosis of the right main pulmonary artery due to external mass effect. Normal heart size. Small to moderate volume pericardial effusion. Left-to-right midline shift. Mediastinum/Nodes: Infiltrative mass of the mediastinum with markedly limited evaluation of its borders due to timing of contrast: Measures up to at least 5.8 cm on axial imaging (10:229). Mediastinal lymphadenopathy with as an example a 2.7 cm right paratracheal lymph node (2:58). No definite left hilar lymphadenopathy. No axillary nodes. Question extension of the mass into the carina (11:61). Thyroid gland demonstrate no significant findings. The esophagus is not well visualized due to abutment to the mass. Lungs/Pleura: Complete collapse of the right lower lobe with associated endobronchial debris/lesion. A 1.7 cm ground-glass airspace opacity at the right apex. Associated vague peripheral ground-glass airspace opacities within the right upper lobe. At least mild to  moderate paraseptal and centrilobular emphysematous changes. No focal consolidation, pulmonary nodule, mass of the left lung. No left pleural effusion. No right pleural effusion. No pneumothorax. Upper Abdomen: No acute abnormality. Musculoskeletal: Minimally displaced subacute to chronic fracture of the mid sternal body. Associated underlying cirrhosis may represent pathologic fracture versus healing. Otherwise no suspicious lytic or blastic osseous lesions. No acute displaced fracture. Multilevel degenerative changes of the spine. Slight vertebral body height loss anteriorly of the T9 vertebral body. Review of the MIP images confirms the above findings. IMPRESSION: 1. No pulmonary embolus; however, mild narrowing with no significant stenosis of the right main pulmonary artery due to external mass effect from the mass described below. 2. Infiltrative mass of the mediastinum with markedly limited evaluation of its borders due to timing of contrast. Associated complete collapse of the right lower lobe with associated complete right lower lobe bronchi occlusion due to endobronchial debris/lesion. Question extension of the mass into the carina.Associated left-to-right midline shift due to decreased lung volume. Concern for underlying malignancy - likely of the mediastinum with extent into or around the right mainstem bronchi leading to collapse the right lower lobe. Additional imaging evaluation or consultation with Pulmonology or Thoracic Surgery recommended. 3. Mediastinal lymphadenopathy. 4. A 1.7 cm ground-glass airspace opacity at the right apex. Associated vague peripheral ground-glass airspace opacities within the right upper lobe. 5. Small to moderate volume pericardial effusion. 6. Subacute to chronic fracture of the mid sternal body. Underlying sclerosis likely due to healing; however, pathologic fracture is not fully excluded. 7. Emphysema (ICD10-J43.9). Electronically Signed   By: Iven Finn M.D.   On:  05/15/2021 18:12   ECHOCARDIOGRAM COMPLETE  Result Date: 05/10/2021    ECHOCARDIOGRAM REPORT   Patient Name:   Christopher Henry Date of Exam: 05/06/2021 Medical Rec #:  [id number]    Height:       76.0 in Accession #:    [id number]   Weight:       135.1 lb Date of Birth:  [birth year]     BSA:          1.875 m  Patient Age:    31 years     BP:           108/78 mmHg Patient Gender: M            HR:           107 bpm. Exam Location:  Inpatient Procedure: 2D Echo and 3D Echo Indications:    Pericardial effusion  History:        Patient has no prior history of Echocardiogram examinations.                 COPD; Risk Factors:Current Smoker. ETOH abuse.  Sonographer:    Clayton Lefort RDCS (AE) Referring Phys: [phone] RAKESH V ALVA  Sonographer Comments: Suboptimal subcostal window. IMPRESSIONS  1. Left ventricular ejection fraction, by estimation, is 60 to 65%. The left ventricle has normal function. The left ventricle has no regional wall motion abnormalities. Left ventricular diastolic parameters were normal.  2. Right ventricular systolic function is normal. The right ventricular size is normal.  3. Large circumferential pericardial effusion largest in posterior and lateral aspects of LV IVC not well seen no respiratory variation in mitral inflow and only RA diastolic collapse not RV. Given likely diagnosis of metastatic lung cancer consider CVTS consult for pericardial window and for both Rx and diagnostic reasons . Large pericardial effusion. The pericardial effusion is circumferential.  4. The mitral valve is normal in structure. Trivial mitral valve regurgitation. No evidence of mitral stenosis.  5. The aortic valve is tricuspid. Aortic valve regurgitation is not visualized. Mild to moderate aortic valve sclerosis/calcification is present, without any evidence of aortic stenosis.  6. The inferior vena cava is normal in size with greater than 50% respiratory variability, suggesting right atrial pressure of 3 mmHg. FINDINGS   Left Ventricle: Left ventricular ejection fraction, by estimation, is 60 to 65%. The left ventricle has normal function. The left ventricle has no regional wall motion abnormalities. The left ventricular internal cavity size was normal in size. There is  no left ventricular hypertrophy. Left ventricular diastolic parameters were normal. Right Ventricle: The right ventricular size is normal. No increase in right ventricular wall thickness. Right ventricular systolic function is normal. Left Atrium: Left atrial size was normal in size. Right Atrium: Right atrial size was normal in size. Pericardium: Large circumferential pericardial effusion largest in posterior and lateral aspects of LV IVC not well seen no respiratory variation in mitral inflow and only RA diastolic collapse not RV. Given likely diagnosis of metastatic lung cancer consider CVTS consult for pericardial window and for both Rx and diagnostic reasons. A large pericardial effusion is present. The pericardial effusion is circumferential. Mitral Valve: The mitral valve is normal in structure. There is mild thickening of the mitral valve leaflet(s). There is mild calcification of the mitral valve leaflet(s). Trivial mitral valve regurgitation. No evidence of mitral valve stenosis. Tricuspid Valve: The tricuspid valve is normal in structure. Tricuspid valve regurgitation is not demonstrated. No evidence of tricuspid stenosis. Aortic Valve: The aortic valve is tricuspid. Aortic valve regurgitation is not visualized. Mild to moderate aortic valve sclerosis/calcification is present, without any evidence of aortic stenosis. Aortic valve mean gradient measures 4.0 mmHg. Aortic valve peak gradient measures 5.9 mmHg. Aortic valve area, by VTI measures 2.93 cm. Pulmonic Valve: The pulmonic valve was normal in structure. Pulmonic valve regurgitation is not visualized. No evidence of pulmonic stenosis. Aorta: The aortic root is normal in size and structure. Venous:  The inferior vena cava is normal  in size with greater than 50% respiratory variability, suggesting right atrial pressure of 3 mmHg. IAS/Shunts: No atrial level shunt detected by color flow Doppler.  LEFT VENTRICLE PLAX 2D LVIDd:         4.10 cm   Diastology LVIDs:         2.60 cm   LV e' medial:    8.55 cm/s LV PW:         1.00 cm   LV E/e' medial:  10.2 LV IVS:        0.90 cm   LV e' lateral:   8.78 cm/s LVOT diam:     2.20 cm   LV E/e' lateral: 10.0 LV SV:         44 LV SV Index:   23 LVOT Area:     3.80 cm  RIGHT VENTRICLE RV Basal diam:  2.70 cm RV S prime:     25.50 cm/s TAPSE (M-mode): 2.8 cm LEFT ATRIUM             Index        RIGHT ATRIUM           Index LA diam:        3.00 cm 1.60 cm/m   RA Area:     13.20 cm LA Vol (A2C):   21.3 ml 11.36 ml/m  RA Volume:   32.30 ml  17.22 ml/m LA Vol (A4C):   51.5 ml 27.46 ml/m LA Biplane Vol: 33.5 ml 17.86 ml/m  AORTIC VALVE AV Area (Vmax):    2.87 cm AV Area (Vmean):   2.66 cm AV Area (VTI):     2.93 cm AV Vmax:           121.00 cm/s AV Vmean:          93.200 cm/s AV VTI:            0.149 m AV Peak Grad:      5.9 mmHg AV Mean Grad:      4.0 mmHg LVOT Vmax:         91.30 cm/s LVOT Vmean:        65.100 cm/s LVOT VTI:          0.115 m LVOT/AV VTI ratio: 0.77  AORTA Ao Root diam: 2.90 cm Ao Asc diam:  3.10 cm MITRAL VALVE               TRICUSPID VALVE MV Area (PHT): 5.34 cm    TR Peak grad:   21.5 mmHg MV Decel Time: 142 msec    TR Vmax:        232.00 cm/s MV E velocity: 87.50 cm/s MV A velocity: 78.30 cm/s  SHUNTS MV E/A ratio:  1.12        Systemic VTI:  0.12 m                            Systemic Diam: 2.20 cm Jenkins Rouge MD Electronically signed by Jenkins Rouge MD Signature Date/Time: 05/23/2021/3:57:03 PM    Final        I have independently reviewed the above radiology studies  and reviewed the findings with the patient.   Recent Lab Findings: Lab Results  Component Value Date   WBC 30.4 (H) 05/24/2021   HGB 8.4 (L) 05/10/2021   HCT 26.1 (L)  05/22/2021   PLT 455 (H) 05/07/2021   GLUCOSE 101 (H) 05/29/2021   ALT 8 05/04/2021  AST 15 05/15/2021   NA 135 05/18/2021   K 3.4 (L) 05/24/2021   CL 106 06/02/2021   CREATININE 1.27 (H) 05/18/2021   BUN 12 05/12/2021   CO2 17 (L) 05/05/2021   INR 1.4 (H) 05/21/2021      Assessment / Plan:   50 year old male with pericardial effusion in the setting of locally advanced lung cancer.  Pericardial effusion is likely malignant.  We will plan for a left robotic assisted thoracoscopy with pericardial.     I  spent 40 minutes with  the patient face to face in counseling and coordination of care.    Lajuana Matte 05/22/2021 7:56 AM

## 2021-05-19 NOTE — Op Note (Signed)
      [address]       [place] [address]             [phone]        05/30/2021  Patient:  Christopher Henry Pre-Op Dx: Right lung malignancy.   Mediastinal and subcarinal invasion   Pericardial effusion Post-op Dx: Same Procedure: -Aborted left robotic assisted thoracoscopy -Left anterior minithoracotomy - Pericardial Window    Surgeon and Role:      * Ahmiya Abee, Lucile Crater, MD - Primary    *E. Barrett, PA-C- assisting  Anesthesia  general EBL: Minimal  Blood Administration: None Specimen: Pericardium.  Pericardial fluid for cytology  Drains: 45 [place] Blake in the pericardium, 19 [place] in the left pleural space Counts: correct   Indications: 50 year old male with pericardial effusion in the setting of locally advanced lung cancer.  Pericardial effusion is likely malignant.  We will plan for a left robotic assisted thoracoscopy with pericardial.    Findings: We were unable to position the double-lumen endotracheal tube due to significant edema and tumor involvement at the carina.  I then elected to proceed with a anterior minithoracotomy on the left.  There was a bloody effusion that was released.  Operative Technique: After the risks, benefits and alternatives were thoroughly discussed, the patient was brought to the operative theatre.  Anesthesia was induced, and the patient was then placed in a lazy lateral decubitus position and was prepped and draped in normal sterile fashion.  An appropriate surgical pause was performed, and pre-operative antibiotics were dosed accordingly.  We began with 4cm incision in the anterior axillary line at the 4th intercostal space.  This was carried down with a combination of Bovie cautery and blunt dissection into the left pleural space was entered.  The lung was then retracted laterally and the pericardium was evident.  We opened the pericardium with Bovie cautery.  We then performed a 3 x 3 cm pericardial  window.  The specimen was passed off.  The bloody effusion was also collected and sent off for specimen.  Two19 [place] Blake drains were then passed into the pleural space.  The most medial one was passed into the pericardium.  And the other was left in the pleural space.  The incision was then injected with Exparel.  The skin and soft tissues were then closed with several layers of absorbable suture.    The patient tolerated the procedure without any immediate complications, and was transferred to the PACU in stable condition.  Christopher Henry

## 2021-05-19 NOTE — Anesthesia Procedure Notes (Signed)
Arterial Line Insertion Start/End10/01/2021 12:30 PM, 05/07/2021 12:40 PM Performed by: Murvin Natal, MD, anesthesiologist  Patient location: Pre-op. Preanesthetic checklist: patient identified, IV checked, site marked, risks and benefits discussed, surgical consent, monitors and equipment checked, pre-op evaluation, timeout performed and anesthesia consent Lidocaine 1% used for infiltration Left, radial was placed Catheter size: 20 G Hand hygiene performed , maximum sterile barriers used  and Seldinger technique used  Attempts: 1 (Previous attempts by CRNAs) Procedure performed using ultrasound guided technique. Following insertion, dressing applied and Biopatch. Post procedure assessment: normal and unchanged  Post procedure complications: second provider assisted. Patient tolerated the procedure well with no immediate complications.

## 2021-05-19 NOTE — H&P (View-Only) (Signed)
S/p pericardial window this afternoon.  Currently w chest pain after chest tubes placed and pericardial window.   Pt's colonoscopy, EGD are rescheduled for tmrw at 0730. For investigation melena, anemia.   Pt saw blood w last nights bowel prep.  No nausea,  no abd pain. Also has mediastinal mass/adenopathy, hypoxic resp failure, weight loss  Hgb is 8.  Iron 10, TIBC 280, iron sat 4%, ferritin 74.  B12 and folate not assayed.     Scheduled Meds:  [MAR Hold] (feeding supplement) PROSource Plus  30 mL Oral BID BM   [MAR Hold] colchicine  0.6 mg Oral Daily   [MAR Hold] feeding supplement  1 Container Oral TID BM   [MAR Hold] folic acid  1 mg Oral Daily   [MAR Hold] mometasone-formoterol  2 puff Inhalation BID   And   [MAR Hold] umeclidinium bromide  1 puff Inhalation Daily   [MAR Hold] multivitamin with minerals  1 tablet Oral Daily   [MAR Hold] pantoprazole  40 mg Oral BID   [MAR Hold] potassium chloride  60 mEq Oral Once   [MAR Hold] thiamine  100 mg Oral Daily   Or   [MAR Hold] thiamine  100 mg Intravenous Daily   Continuous Infusions:  [MAR Hold] sodium chloride     acetaminophen     [MAR Hold] azithromycin 500 mg (05/18/21 2230)   [MAR Hold] cefTRIAXone (ROCEPHIN)  IV 1 g (05/18/21 2215)   lactated ringers 75 mL/hr at 05/24/2021 0615   lactated ringers 10 mL/hr at 05/04/2021 1129   PRN Meds:.acetaminophen, [MAR Hold] acetaminophen (TYLENOL) oral liquid 160 mg/5 mL, [MAR Hold] albuterol, amisulpride, fentaNYL (SUBLIMAZE) injection, [MAR Hold] LORazepam **OR** [MAR Hold] LORazepam, oxyCODONE **OR** oxyCODONE, promethazine  Keep on clears, NPO after MN.  Give additional prep w 32 ox gatorade/Miralax.    Azucena Freed PA-C    Attending physician's note   I have taken an interval history, reviewed the chart and examined the patient. I agree with the Advanced Practitioner's note, impression and recommendations.   S/P pericardial window today Hence, EGD/colon has been rescheduled for  tomorrow. I have confirmed that it is okay with Dr Kipp Brood thru secure chat  Trend CBC.  Carmell Austria, MD [facility] [phone]

## 2021-05-19 NOTE — Progress Notes (Signed)
Pt arrived to unit from  PACU , A/O x 4,  CCMD called ,CHG given, pt oriented to unit,Will continue to monitor.  Pt has JP drain x2 water seal.   Albin Felling Farrell Pantaleo, RN    05/24/2021 1600  Vitals  Temp 97.9 F (36.6 C)  Temp Source Oral  BP (!) 143/84  MAP (mmHg) 101  BP Location Left Arm  BP Method Automatic  Patient Position (if appropriate) Lying  Pulse Rate (!) 112  Pulse Rate Source Monitor  ECG Heart Rate (!) 109  Resp 19  Level of Consciousness  Level of Consciousness Alert  Oxygen Therapy  SpO2 92 %  O2 Device Nasal Cannula  O2 Flow Rate (L/min) 2 L/min  Art Line  Arterial Line Location Left radial  Pain Assessment  Pain Scale 0-10  Pain Score 3  MEWS Score  MEWS Temp 0  MEWS Systolic 0  MEWS Pulse 1  MEWS RR 0  MEWS LOC 0  MEWS Score 1  MEWS Score Color Green

## 2021-05-19 NOTE — Progress Notes (Addendum)
S/p pericardial window this afternoon.  Currently w chest pain after chest tubes placed and pericardial window.   Pt's colonoscopy, EGD are rescheduled for tmrw at 0730. For investigation melena, anemia.   Pt saw blood w last nights bowel prep.  No nausea,  no abd pain. Also has mediastinal mass/adenopathy, hypoxic resp failure, weight loss  Hgb is 8.  Iron 10, TIBC 280, iron sat 4%, ferritin 74.  B12 and folate not assayed.     Scheduled Meds:  [MAR Hold] (feeding supplement) PROSource Plus  30 mL Oral BID BM   [MAR Hold] colchicine  0.6 mg Oral Daily   [MAR Hold] feeding supplement  1 Container Oral TID BM   [MAR Hold] folic acid  1 mg Oral Daily   [MAR Hold] mometasone-formoterol  2 puff Inhalation BID   And   [MAR Hold] umeclidinium bromide  1 puff Inhalation Daily   [MAR Hold] multivitamin with minerals  1 tablet Oral Daily   [MAR Hold] pantoprazole  40 mg Oral BID   [MAR Hold] potassium chloride  60 mEq Oral Once   [MAR Hold] thiamine  100 mg Oral Daily   Or   [MAR Hold] thiamine  100 mg Intravenous Daily   Continuous Infusions:  [MAR Hold] sodium chloride     acetaminophen     [MAR Hold] azithromycin 500 mg (05/18/21 2230)   [MAR Hold] cefTRIAXone (ROCEPHIN)  IV 1 g (05/18/21 2215)   lactated ringers 75 mL/hr at 05/05/2021 0615   lactated ringers 10 mL/hr at 05/25/2021 1129   PRN Meds:.acetaminophen, [MAR Hold] acetaminophen (TYLENOL) oral liquid 160 mg/5 mL, [MAR Hold] albuterol, amisulpride, fentaNYL (SUBLIMAZE) injection, [MAR Hold] LORazepam **OR** [MAR Hold] LORazepam, oxyCODONE **OR** oxyCODONE, promethazine  Keep on clears, NPO after MN.  Give additional prep w 32 ox gatorade/Miralax.    Azucena Freed PA-C    Attending physician's note   I have taken an interval history, reviewed the chart and examined the patient. I agree with the Advanced Practitioner's note, impression and recommendations.   S/P pericardial window today Hence, EGD/colon has been rescheduled for  tomorrow. I have confirmed that it is okay with Dr Kipp Brood thru secure chat  Trend CBC.  Carmell Austria, MD [facility] [phone]

## 2021-05-19 NOTE — Transfer of Care (Signed)
Immediate Anesthesia Transfer of Care Note  Patient: Christopher Henry  Procedure(s) Performed: PERICARDIAL WINDOW (Left)  Patient Location: PACU  Anesthesia Type:General  Level of Consciousness: drowsy and patient cooperative  Airway & Oxygen Therapy: Patient Spontanous Breathing and Patient connected to nasal cannula oxygen  Post-op Assessment: Report given to RN and Post -op Vital signs reviewed and stable  Post vital signs: Reviewed and stable  Last Vitals:  Vitals Value Taken Time  BP    Temp    Pulse    Resp    SpO2      Last Pain:  Vitals:   05/11/2021 1123  TempSrc:   PainSc: 0-No pain         Complications: No notable events documented.

## 2021-05-19 NOTE — Progress Notes (Signed)
Progress Note  Patient Name: Christopher Henry Date of Encounter: 06/01/2021  [facility] Cardiologist: Evalina Field, MD   Subjective   Awaiting pericardial window  Inpatient Medications    Scheduled Meds:  (feeding supplement) PROSource Plus  30 mL Oral BID BM   colchicine  0.6 mg Oral Daily   feeding supplement  1 Container Oral TID BM   folic acid  1 mg Oral Daily   mometasone-formoterol  2 puff Inhalation BID   And   umeclidinium bromide  1 puff Inhalation Daily   multivitamin with minerals  1 tablet Oral Daily   pantoprazole  40 mg Oral BID   thiamine  100 mg Oral Daily   Or   thiamine  100 mg Intravenous Daily   Continuous Infusions:  sodium chloride     azithromycin 500 mg (05/18/21 2230)   cefTRIAXone (ROCEPHIN)  IV 1 g (05/18/21 2215)   lactated ringers 75 mL/hr at 06/01/2021 0615   PRN Meds: acetaminophen (TYLENOL) oral liquid 160 mg/5 mL, albuterol, LORazepam **OR** LORazepam   Vital Signs    Vitals:   05/18/21 2353 05/25/2021 0405 06/01/2021 0856 05/24/2021 0905  BP: 124/70 (!) 142/92  (!) 142/92  Pulse: 100 100 (!) 120 (!) 113  Resp: 20 20 20 20   Temp: 98.4 F (36.9 C) 98.7 F (37.1 C)  98.7 F (37.1 C)  TempSrc: Oral Oral  Oral  SpO2: 98% 99% 96% 92%  Weight:       No intake or output data in the 24 hours ending 05/10/2021 0926 Last 3 Weights 05/15/2021 05/25/2021 04/24/2021  Weight (lbs) 135 lb 2.3 oz 131 lb 133 lb  Weight (kg) 61.3 kg 59.421 kg 60.328 kg      Telemetry    ST, rates 120s - Personally Reviewed  ECG    No new tracint  Physical Exam   GEN: No acute distress.  Thin Neck: No JVD Cardiac: Tachycardic, no murmurs, rubs, or gallops.  Respiratory: Clear to auscultation bilaterally. GI: Soft, nontender, non-distended  MS: No edema; No deformity. Neuro:  Nonfocal  Psych: Flat affect   Labs    High Sensitivity Troponin:   Recent Labs  Lab 05/10/2021 1604 05/30/2021 1755  TROPONINIHS 49* 8     Chemistry Recent Labs  Lab  05/13/2021 1604 05/08/2021 0743 05/18/21 0221 05/07/2021 0430  NA 134* 136 134* 135  K 4.2 4.0 4.0 3.4*  CL 101 105 105 106  CO2 19* 19* 20* 17*  GLUCOSE 111* 104* 107* 101*  BUN 31* 27* 20 12  CREATININE 1.58* 1.22 1.29* 1.27*  CALCIUM 8.8* 8.4* 8.1* 8.4*  PROT 7.6  --   --   --   ALBUMIN 3.3*  --   --   --   AST 15  --   --   --   ALT 8  --   --   --   ALKPHOS 61  --   --   --   BILITOT 0.5  --   --   --   GFRNONAA 53* >60 >60 >60  ANIONGAP 14 12 9 12     Lipids No results for input(s): CHOL, TRIG, HDL, LABVLDL, LDLCALC, CHOLHDL in the last 168 hours.  Hematology Recent Labs  Lab 05/18/2021 0743 05/09/2021 1413 05/18/21 0221 05/18/21 0947 05/18/21 1958 05/28/2021 0430 05/24/2021 0832  WBC 25.3*  --  25.5*  --   --  30.4*  --   RBC 2.98*  --  2.67*  2.63*  --   --  3.22*  --   HGB 7.8*   < > 6.8*   < > 7.9* 8.4* 8.0*  HCT 23.0*   < > 20.9*   < > 23.7* 26.1* 24.3*  MCV 77.2*  --  78.3*  --   --  81.1  --   MCH 26.2  --  25.5*  --   --  26.1  --   MCHC 33.9  --  32.5  --   --  32.2  --   RDW 18.3*  --  18.1*  --   --  18.5*  --   PLT 393  --  389  --   --  455*  --    < > = values in this interval not displayed.   Thyroid No results for input(s): TSH, FREET4 in the last 168 hours.  BNP Recent Labs  Lab 05/11/2021 1341  BNP 68.6    DDimer  Recent Labs  Lab 05/15/2021 1415  DDIMER 3.52*     Radiology    ECHOCARDIOGRAM COMPLETE  Result Date: 05/28/2021    ECHOCARDIOGRAM REPORT   Patient Name:   Christopher Henry Date of Exam: 05/26/2021 Medical Rec #:  [id number]    Height:       76.0 in Accession #:    [id number]   Weight:       135.1 lb Date of Birth:  Jul 02, 1971     BSA:          1.875 m Patient Age:    50 years     BP:           108/78 mmHg Patient Gender: M            HR:           107 bpm. Exam Location:  Inpatient Procedure: 2D Echo and 3D Echo Indications:    Pericardial effusion  History:        Patient has no prior history of Echocardiogram examinations.                  COPD; Risk Factors:Current Smoker. ETOH abuse.  Sonographer:    Clayton Lefort RDCS (AE) Referring Phys: [phone] RAKESH V ALVA  Sonographer Comments: Suboptimal subcostal window. IMPRESSIONS  1. Left ventricular ejection fraction, by estimation, is 60 to 65%. The left ventricle has normal function. The left ventricle has no regional wall motion abnormalities. Left ventricular diastolic parameters were normal.  2. Right ventricular systolic function is normal. The right ventricular size is normal.  3. Large circumferential pericardial effusion largest in posterior and lateral aspects of LV IVC not well seen no respiratory variation in mitral inflow and only RA diastolic collapse not RV. Given likely diagnosis of metastatic lung cancer consider CVTS consult for pericardial window and for both Rx and diagnostic reasons . Large pericardial effusion. The pericardial effusion is circumferential.  4. The mitral valve is normal in structure. Trivial mitral valve regurgitation. No evidence of mitral stenosis.  5. The aortic valve is tricuspid. Aortic valve regurgitation is not visualized. Mild to moderate aortic valve sclerosis/calcification is present, without any evidence of aortic stenosis.  6. The inferior vena cava is normal in size with greater than 50% respiratory variability, suggesting right atrial pressure of 3 mmHg. FINDINGS  Left Ventricle: Left ventricular ejection fraction, by estimation, is 60 to 65%. The left ventricle has normal function. The left ventricle has no regional wall motion abnormalities. The left ventricular internal cavity size was normal in size. There is  no left ventricular hypertrophy. Left ventricular diastolic parameters were normal. Right Ventricle: The right ventricular size is normal. No increase in right ventricular wall thickness. Right ventricular systolic function is normal. Left Atrium: Left atrial size was normal in size. Right Atrium: Right atrial size was normal in size. Pericardium:  Large circumferential pericardial effusion largest in posterior and lateral aspects of LV IVC not well seen no respiratory variation in mitral inflow and only RA diastolic collapse not RV. Given likely diagnosis of metastatic lung cancer consider CVTS consult for pericardial window and for both Rx and diagnostic reasons. A large pericardial effusion is present. The pericardial effusion is circumferential. Mitral Valve: The mitral valve is normal in structure. There is mild thickening of the mitral valve leaflet(s). There is mild calcification of the mitral valve leaflet(s). Trivial mitral valve regurgitation. No evidence of mitral valve stenosis. Tricuspid Valve: The tricuspid valve is normal in structure. Tricuspid valve regurgitation is not demonstrated. No evidence of tricuspid stenosis. Aortic Valve: The aortic valve is tricuspid. Aortic valve regurgitation is not visualized. Mild to moderate aortic valve sclerosis/calcification is present, without any evidence of aortic stenosis. Aortic valve mean gradient measures 4.0 mmHg. Aortic valve peak gradient measures 5.9 mmHg. Aortic valve area, by VTI measures 2.93 cm. Pulmonic Valve: The pulmonic valve was normal in structure. Pulmonic valve regurgitation is not visualized. No evidence of pulmonic stenosis. Aorta: The aortic root is normal in size and structure. Venous: The inferior vena cava is normal in size with greater than 50% respiratory variability, suggesting right atrial pressure of 3 mmHg. IAS/Shunts: No atrial level shunt detected by color flow Doppler.  LEFT VENTRICLE PLAX 2D LVIDd:         4.10 cm   Diastology LVIDs:         2.60 cm   LV e' medial:    8.55 cm/s LV PW:         1.00 cm   LV E/e' medial:  10.2 LV IVS:        0.90 cm   LV e' lateral:   8.78 cm/s LVOT diam:     2.20 cm   LV E/e' lateral: 10.0 LV SV:         44 LV SV Index:   23 LVOT Area:     3.80 cm  RIGHT VENTRICLE RV Basal diam:  2.70 cm RV S prime:     25.50 cm/s TAPSE (M-mode): 2.8 cm  LEFT ATRIUM             Index        RIGHT ATRIUM           Index LA diam:        3.00 cm 1.60 cm/m   RA Area:     13.20 cm LA Vol (A2C):   21.3 ml 11.36 ml/m  RA Volume:   32.30 ml  17.22 ml/m LA Vol (A4C):   51.5 ml 27.46 ml/m LA Biplane Vol: 33.5 ml 17.86 ml/m  AORTIC VALVE AV Area (Vmax):    2.87 cm AV Area (Vmean):   2.66 cm AV Area (VTI):     2.93 cm AV Vmax:           121.00 cm/s AV Vmean:          93.200 cm/s AV VTI:            0.149 m AV Peak Grad:      5.9 mmHg AV Mean Grad:  4.0 mmHg LVOT Vmax:         91.30 cm/s LVOT Vmean:        65.100 cm/s LVOT VTI:          0.115 m LVOT/AV VTI ratio: 0.77  AORTA Ao Root diam: 2.90 cm Ao Asc diam:  3.10 cm MITRAL VALVE               TRICUSPID VALVE MV Area (PHT): 5.34 cm    TR Peak grad:   21.5 mmHg MV Decel Time: 142 msec    TR Vmax:        232.00 cm/s MV E velocity: 87.50 cm/s MV A velocity: 78.30 cm/s  SHUNTS MV E/A ratio:  1.12        Systemic VTI:  0.12 m                            Systemic Diam: 2.20 cm Jenkins Rouge MD Electronically signed by Jenkins Rouge MD Signature Date/Time: 05/18/2021/3:57:03 PM    Final     Cardiac Studies   Echo 05/30/2021: 1. Left ventricular ejection fraction, by estimation, is 60 to 65%. The  left ventricle has normal function. The left ventricle has no regional  wall motion abnormalities. Left ventricular diastolic parameters were  normal.   2. Right ventricular systolic function is normal. The right ventricular  size is normal.   3. Large circumferential pericardial effusion largest in posterior and  lateral aspects of LV IVC not well seen no respiratory variation in mitral  inflow and only RA diastolic collapse not RV. Given likely diagnosis of  metastatic lung cancer consider  CVTS consult for pericardial window and for both Rx and diagnostic reasons  . Large pericardial effusion. The pericardial effusion is circumferential.   4. The mitral valve is normal in structure. Trivial mitral valve   regurgitation. No evidence of mitral stenosis.   5. The aortic valve is tricuspid. Aortic valve regurgitation is not  visualized. Mild to moderate aortic valve sclerosis/calcification is  present, without any evidence of aortic stenosis.   6. The inferior vena cava is normal in size with greater than 50%  respiratory variability, suggesting right atrial pressure of 3 mmHg.   Patient Profile     50 y.o. male with a hx of tobacco abuse, COPD, HTN, heavy alcohol use, and anxiety who is being seen 05/18/2021 for the evaluation of pericardial effusion in the setting of suspected metastatic lung cancer at the request of Dr. Lupita Leash.  Assessment & Plan    Malignant Pericardial Effusion: Echo 10/15 with large pericardial effusion with RA collapse. Images were reviewed with TCTS per Dr. Marisue Ivan. Now evaluated by [name] with plans for left robotic assisted thoracoscopy with pericardial window today. HR, BP and O2 sats remain stable.   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 Biopsies pending -- PCCM following -- pending radiation oncology evaluation  Hematochezia/melena: Heavy ETOH use along with daily NSAIDs PTA. Hgb dropped to 6.9 on admission s/p 2 units PRBC. Hgb 8.4 this morning. Evaluated by GI, initial plans for EGD/colonoscopy yesterday but didn't complete enough of bowel prep. Plans per GI -- PPI BID  Per primary: Heavy ETOH use/tobacco use: reports recent cessation before admission Severe protein deficiency Sepsis 2/2 PNA   For questions or updates, please contact [facility] Please consult [email or web address] for contact info  under        Signed, Reino Bellis, NP  05/22/2021, 9:26 AM    I have examined the patient and reviewed assessment and plan and discussed with patient.  Agree with above as stated.  Spoke at length with the patient and family  at bedside regarding the pericardial effusion and his other procedures that need to be done.  I explained that given the amount of fluid, he will need the pericardial effusion drained before he could be anesthetized for other procedures.  The pericardial window will help with his hemodynamics.  GI still planning colonoscopy at a later time.  All questions answered.  We will follow.  Larae Grooms

## 2021-05-19 NOTE — Brief Op Note (Signed)
05/29/2021 - 05/08/2021  2:39 PM  PATIENT:  Christopher Henry  50 y.o. male  PRE-OPERATIVE DIAGNOSIS:  pericardial effusion  POST-OPERATIVE DIAGNOSIS:  pericardial effusion  PROCEDURE:   Procedure(s):  MINI LEFT THORACOTOMY -Creation of PERICARDIAL WINDOW (Left)  SURGEON:  Surgeon(s) and Role:    * Lightfoot, Lucile Crater, MD - Primary  PHYSICIAN ASSISTANT: Ellwood Handler PA-C  ANESTHESIA:   general  EBL:  Per Anesthesia Record  BLOOD ADMINISTERED:none  DRAINS:  19 Blake Drain x 2 Left Chest    LOCAL MEDICATIONS USED:  BUPIVICAINE   SPECIMEN:  Source of Specimen:  Pleural Fluid, Pericardium  DISPOSITION OF SPECIMEN:   Pathology, Cytology  COUNTS:  YES  TOURNIQUET:  * No tourniquets in log *  DICTATION: .Dragon Dictation  PLAN OF CARE:  Patient with active admission order  PATIENT DISPOSITION:  PACU - hemodynamically stable.   Delay start of Pharmacological VTE agent (>24hrs) due to surgical blood loss or risk of bleeding: yes

## 2021-05-19 NOTE — Progress Notes (Signed)
PROGRESS NOTE    Christopher Henry  VZD:[id number] DOB: 04/22/1971 DOA: 05/13/2021 PCP: Bary Castilla, NP   Chief Complaint  Patient presents with   Shortness of Breath    Brief Narrative/Hospital Course:  Christopher Henry, 50 y.o. male with PMH of COPD, HTN, alcohol abuse, tobacco use 1 pack/day x 35 years sent from pulmonary office to ED for acute respiratory distress hypoxic 80% on room air, nonproductive cough and hematemesis over the past 2 months, burning sensation on the chest and greater than 50 pound weight loss x12 months. In the ED, anemic hemoglobin 6.9 g, leukocytosis, D-dimer 3.5, initial troponin 49 lactic acidosis 2.4 creatinine 1.5 chest x-ray RLL pneumonia. CT 18/14 no PE, infiltrative mass of the mediastinum, complete collapse of the right lower lobe with associated complete right lower lobe bronchi occlusion due to endobronchial debris/lesion,?  Mass extension into the carina, left to right midline shift due to decreased lung volume-concern for malignancy likely of the mediastinum with extension into or around the right mainstem bronchi leading to collapse of the right lower lobe, mediastinal lymphadenopathy, 1.7 cm groundglass opacity right apex, small to moderate volume pericardial effusion, subacute to chronic fracture of the medial sternal body. 10/15 underwent bronchoscopy-showed large subcarinal mass indenting the carina and bulging and bilaterally with large mainstem lesion obscuring 50% of lumen, biopsies taken. Large pericardial effusion noted in the echo, cardiology and thoracic surgery following and planning for left robotic assisted thoracoscopy with pericardial window  Subjective:  Seen this morning alert awake.  Mild dizziness on getting up and walking.  Otherwise no chest pain no cough no fever today.  Was febrile yesterday.   Fianc and mother at the bedside.  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  Underwent bronchoscopy 10/15-biopsy pending.  Rad Onc consulted-and planning for urgent radiotherapy. Will need transfer to [facility] at some point.  Currently on room will need PET scan as outpatient.  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  Currently hemodynamically stable  Acute hypoxemic respiratory failure hypoxic 80% on room air with respiratory distress on admission: Due to pneumonia/mediastinal mass: On room air currently.    Sepsis secondary to pneumonia RLL post obstructive pneumonia: In the setting of malignancy.  Remains intermittently febrile also with leukocytosis- on ceftriaxone azithromycin.-Pulmonary following-May need to escalate antibiotics- await Pulm inputs. Recent Labs  Lab 05/25/2021 1341 06/01/2021 1604 05/15/2021 1755 06/02/2021 2054 06/01/2021 0743 05/18/21 0221 05/11/2021 0430  WBC 30.8*  --   --   --  25.3* 25.5* 30.4*  LATICACIDVEN  --  2.4* 2.7*  --   --   --   --   PROCALCITON  --   --   --  0.19 0.25 0.25  --     Acute blood loss anemia in the setting of painless rectal bleeding BRBPR Microcytic anemia suspect anemia of chronic disease as well History of chronic hemorrhoids-which he was able to reduce at home: Status post multiple PRBC transfusions.  H&H this morning improved to 8.4-8.0.GI on board for EGD/colonoscopy -once cleared by cardiothoracic/cardiology  monitor h/h.cont ppi. Recent Labs  Lab 05/18/21 0947 05/18/21 1409 05/18/21 1958 05/10/2021 0430 05/15/2021 0832  HGB 7.8* 7.9* 7.9* 8.4* 8.0*  HCT 24.2* 24.2* 23.7* 26.1* 24.3*    AKI likely in the setting of dehydration/pneumonia/malignancy.   Stable.  Recent Labs  Lab 05/05/2021 1341 05/09/2021 1604 05/30/2021 0743 05/18/21 0221 05/28/2021 0430  BUN 29* 31* 27* 20 12  CREATININE 1.60* 1.58* 1.22 1.29* 1.27*     Anion gap metabolic acidosis due to AKI: Bicarb at 20.  Monitor Hypokalemia 3.4- monitor and replete COPD: Currently not in exacerbation.Continue Dulera,Incruse Ellipta and bronchodilators as needed  Severe protein calorie moderation with significant weight loss and BMI 16.45.  In the setting of malignancy overall poor prognostic indicator.  History of alcohol use/tobacco use patient reports he recently quit.  Continue thiamine folic acid multivitamins and watch for withdrawals.  DVT prophylaxis: SCDs Start: 05/30/2021 2105.  No chemeical prophylaxis due to anemia Code Status:   Code Status: Full Code Family Communication: plan of care discussed with patient his fiance and the mother at bedside. Overall prognosis guarded/poor  Status is: Inpatient Remains inpatient appropriate because: Ongoing management of anemia, pericardial effusion, lung mass pneumonia.  Objective: Vitals: Today's Vitals   05/18/21 2353 05/22/2021 0405 05/12/2021 0856 05/04/2021 0905  BP: 124/70 (!) 142/92  (!) 142/92  Pulse: 100 100 (!) 120 (!) 113  Resp: 20 20 20 20   Temp: 98.4 F (36.9 C) 98.7 F (37.1 C)  98.7 F (37.1 C)  TempSrc: Oral Oral  Oral  SpO2: 98% 99% 96% 92%  Weight:      PainSc:    0-No pain   Physical Examination: General exam: AAOx3, thin, frail, older than stated age, weak appearing. HEENT:Oral mucosa moist, Ear/Nose WNL grossly, dentition normal. Respiratory system: bilaterally diminished breath sounds at the base,  Cardiovascular system: S1 & S2 +, No JVD,. Gastrointestinal system: Abdomen soft,NT,ND, BS+ Nervous System:Alert, awake, moving extremities and grossly nonfocal Extremities: no edema, distal peripheral pulses palpable.  Skin: No rashes,no icterus. MSK: Normal muscle bulk,tone, power .  Medications  reviewed:  Scheduled Meds:  [MAR Hold] (feeding supplement) PROSource Plus  30 mL Oral BID BM   [MAR Hold] colchicine  0.6 mg Oral Daily   [MAR Hold] feeding supplement  1 Container Oral TID BM   [MAR Hold] folic acid  1 mg Oral Daily   [MAR Hold] mometasone-formoterol  2 puff Inhalation BID   And   [MAR Hold] umeclidinium bromide  1 puff Inhalation Daily   [MAR Hold] multivitamin with minerals  1 tablet Oral Daily   [MAR Hold] pantoprazole  40 mg Oral BID   [MAR Hold] potassium chloride  60 mEq Oral Once   [MAR Hold] thiamine  100 mg Oral Daily   Or   [MAR Hold] thiamine  100 mg Intravenous Daily   Continuous Infusions:  [MAR Hold] sodium chloride     [MAR Hold] azithromycin 500 mg (05/18/21 2230)   [MAR Hold] cefTRIAXone (ROCEPHIN)  IV 1 g (05/18/21 2215)   lactated ringers 75 mL/hr at 05/23/2021 0615    Diet Order             Diet NPO time specified  Diet effective now                   Nutrition Problem: Inadequate oral intake Etiology: poor appetite Signs/Symptoms: per patient/family report Interventions: MVI, Boost Breeze, Prostat  Intake/Output No intake or output data in the 24 hours ending 05/27/2021 1113  Intake/Output from previous day: 10/16 0701 - 10/17 0700 In: 315 [Blood:315] Out: -  Net IO Since Admission: 3,485.71 mL [05/24/2021  1113]   Weight change:   Wt Readings from Last 3 Encounters:  05/18/2021 61.3 kg  06/01/2021 59.4 kg  04/24/21 60.3 kg     Consultants:see note  Procedures:see note Antimicrobials: Anti-infectives (From admission, onward)    Start     Dose/Rate Route Frequency Ordered Stop   05/10/2021 2145  [MAR Hold]  cefTRIAXone (ROCEPHIN) 1 g in sodium chloride 0.9 % 100 mL IVPB        (MAR Hold since Mon 05/15/2021 at 1109.Hold Reason: Transfer to a Procedural area)   1 g 200 mL/hr over 30 Minutes Intravenous Every 24 hours 05/27/2021 2053     05/15/2021 2145  [MAR Hold]  azithromycin (ZITHROMAX) 500 mg in sodium chloride 0.9 % 250 mL IVPB         (MAR Hold since Mon 05/07/2021 at 1109.Hold Reason: Transfer to a Procedural area)   500 mg 250 mL/hr over 60 Minutes Intravenous Every 24 hours 06/02/2021 2053 05/21/21 2144   05/11/2021 1545  ceFEPIme (MAXIPIME) 2 g in sodium chloride 0.9 % 100 mL IVPB        2 g 200 mL/hr over 30 Minutes Intravenous  Once 05/06/2021 1539 05/22/2021 1640      Culture/Microbiology    Component Value Date/Time   SDES BLOOD LEFT ANTECUBITAL 05/07/2021 1604   SPECREQUEST  05/15/2021 1604    BOTTLES DRAWN AEROBIC AND ANAEROBIC Blood Culture results may not be optimal due to an inadequate volume of blood received in culture bottles   CULT  06/02/2021 1604    NO GROWTH 3 DAYS Performed at [facility], [place] [address], [place], [place] [address]    REPTSTATUS PENDING 06/02/2021 1604    Other culture-see note  Unresulted Labs (From admission, onward)     Start     Ordered   05/26/2021 6045  Basic metabolic panel  Daily,   R     Question:  Specimen collection method  Answer:  Lab=Lab collect   05/18/21 1143   05/08/2021 0500  CBC  Daily,   R     Question:  Specimen collection method  Answer:  Lab=Lab collect   05/18/21 1143   05/18/2021 0842  Hemoglobin and hematocrit, blood  Now then every 6 hours,   R (with TIMED occurrences)      05/10/2021 0842   06/01/2021 1536  Urinalysis, Routine w reflex microscopic Urine, Clean Catch  (Undifferentiated presentation (screening labs and basic nursing orders))  ONCE - STAT,   STAT        05/12/2021 1538           Data Reviewed: I have personally reviewed following labs and imaging studies CBC: Recent Labs  Lab 05/15/2021 1341 05/14/2021 2050 05/15/2021 0743 05/25/2021 1413 05/18/21 0221 05/18/21 0947 05/18/21 1409 05/18/21 1958 05/30/2021 0430 05/22/2021 0832  WBC 30.8*  --  25.3*  --  25.5*  --   --   --  30.4*  --   NEUTROABS 26.8*  --   --   --   --   --   --   --   --   --   HGB 6.9*   < > 7.8*   < > 6.8* 7.8* 7.9* 7.9* 8.4* 8.0*  HCT 21.6*   < > 23.0*   < >  20.9* 24.2* 24.2* 23.7* 26.1* 24.3*  MCV 79.1*  --  77.2*  --  78.3*  --   --   --  81.1  --  PLT 534*  --  393  --  389  --   --   --  455*  --    < > = values in this interval not displayed.    Basic Metabolic Panel: Recent Labs  Lab 05/23/2021 1341 05/13/2021 1604 05/29/2021 0743 05/18/21 0221 05/23/2021 0430  NA 133* 134* 136 134* 135  K 3.9 4.2 4.0 4.0 3.4*  CL 101 101 105 105 106  CO2 20* 19* 19* 20* 17*  GLUCOSE 147* 111* 104* 107* 101*  BUN 29* 31* 27* 20 12  CREATININE 1.60* 1.58* 1.22 1.29* 1.27*  CALCIUM 9.0 8.8* 8.4* 8.1* 8.4*    GFR: Estimated Creatinine Clearance: 60.3 mL/min (A) (by C-G formula based on SCr of 1.27 mg/dL (H)). Liver Function Tests: Recent Labs  Lab 05/24/2021 1604  AST 15  ALT 8  ALKPHOS 61  BILITOT 0.5  PROT 7.6  ALBUMIN 3.3*    Recent Labs  Lab 05/04/2021 1604  LIPASE 29    Recent Labs  Lab 05/21/2021 1604  AMMONIA <10    Coagulation Profile: Recent Labs  Lab 05/18/2021 1604  INR 1.4*    Cardiac Enzymes: No results for input(s): CKTOTAL, CKMB, CKMBINDEX, TROPONINI in the last 168 hours. BNP (last 3 results) No results for input(s): PROBNP in the last 8760 hours. HbA1C: No results for input(s): HGBA1C in the last 72 hours. CBG: No results for input(s): GLUCAP in the last 168 hours. Lipid Profile: No results for input(s): CHOL, HDL, LDLCALC, TRIG, CHOLHDL, LDLDIRECT in the last 72 hours. Thyroid Function Tests: No results for input(s): TSH, T4TOTAL, FREET4, T3FREE, THYROIDAB in the last 72 hours. Anemia Panel: Recent Labs    05/18/21 0221  FERRITIN 74  TIBC 280  IRON 10*  RETICCTPCT 3.0    Sepsis Labs: Recent Labs  Lab 05/25/2021 1604 05/03/2021 1755 05/18/2021 2054 05/28/2021 0743 05/18/21 0221  PROCALCITON  --   --  0.19 0.25 0.25  LATICACIDVEN 2.4* 2.7*  --   --   --      Recent Results (from the past 240 hour(s))  Resp Panel by RT-PCR (Flu A&B, Covid) Nasopharyngeal Swab     Status: None   Collection Time:  05/28/2021  3:38 PM   Specimen: Nasopharyngeal Swab; Nasopharyngeal(NP) swabs in vial transport medium  Result Value Ref Range Status   SARS Coronavirus 2 by RT PCR NEGATIVE NEGATIVE Final    Comment: (NOTE) SARS-CoV-2 target nucleic acids are NOT DETECTED.  The SARS-CoV-2 RNA is generally detectable in upper respiratory specimens during the acute phase of infection. The lowest concentration of SARS-CoV-2 viral copies this assay can detect is 138 copies/mL. A negative result does not preclude SARS-Cov-2 infection and should not be used as the sole basis for treatment or other patient management decisions. A negative result may occur with  improper specimen collection/handling, submission of specimen other than nasopharyngeal swab, presence of viral mutation(s) within the areas targeted by this assay, and inadequate number of viral copies(<138 copies/mL). A negative result must be combined with clinical observations, patient history, and epidemiological information. The expected result is Negative.  Fact Sheet for Patients:  EntrepreneurPulse.com.au  Fact Sheet for Healthcare Providers:  IncredibleEmployment.be  This test is no t yet approved or cleared by the [place] FDA and  has been authorized for detection and/or diagnosis of SARS-CoV-2 by FDA under an Emergency Use Authorization (EUA). This EUA will remain  in effect (meaning this test can be used) for the duration of the COVID-19  declaration under Section 564(b)(1) of the Act, 21 U.S.C.section 360bbb-3(b)(1), unless the authorization is terminated  or revoked sooner.       Influenza A by PCR NEGATIVE NEGATIVE Final   Influenza B by PCR NEGATIVE NEGATIVE Final    Comment: (NOTE) The Xpert Xpress SARS-CoV-2/FLU/RSV plus assay is intended as an aid in the diagnosis of influenza from Nasopharyngeal swab specimens and should not be used as a sole basis for treatment. Nasal washings  and aspirates are unacceptable for Xpert Xpress SARS-CoV-2/FLU/RSV testing.  Fact Sheet for Patients: EntrepreneurPulse.com.au  Fact Sheet for Healthcare Providers: IncredibleEmployment.be  This test is not yet approved or cleared by the [place] FDA and has been authorized for detection and/or diagnosis of SARS-CoV-2 by FDA under an Emergency Use Authorization (EUA). This EUA will remain in effect (meaning this test can be used) for the duration of the COVID-19 declaration under Section 564(b)(1) of the Act, 21 U.S.C. section 360bbb-3(b)(1), unless the authorization is terminated or revoked.  Performed at [facility], [place] [address] [place], [place] [address]   Blood culture (routine single)     Status: None (Preliminary result)   Collection Time: 05/13/2021  4:04 PM   Specimen: BLOOD  Result Value Ref Range Status   Specimen Description BLOOD LEFT ANTECUBITAL  Final   Special Requests   Final    BOTTLES DRAWN AEROBIC AND ANAEROBIC Blood Culture results may not be optimal due to an inadequate volume of blood received in culture bottles   Culture   Final    NO GROWTH 3 DAYS Performed at [facility], [place] [address], [place], [place] [address]    Report Status PENDING  Incomplete      Radiology Studies: ECHOCARDIOGRAM COMPLETE  Result Date: 05/09/2021    ECHOCARDIOGRAM REPORT   Patient Name:   DAXTEN KOVALENKO Date of Exam: 05/26/2021 Medical Rec #:  [id number]    Height:       76.0 in Accession #:    [id number]   Weight:       135.1 lb Date of Birth:  1971/06/13     BSA:          1.875 m Patient Age:    23 years     BP:           108/78 mmHg Patient Gender: M            HR:           107 bpm. Exam Location:  Inpatient Procedure: 2D Echo and 3D Echo Indications:    Pericardial effusion  History:        Patient has no prior history of Echocardiogram examinations.                 COPD; Risk Factors:Current Smoker. ETOH abuse.   Sonographer:    Clayton Lefort RDCS (AE) Referring Phys: [phone] RAKESH V ALVA  Sonographer Comments: Suboptimal subcostal window. IMPRESSIONS  1. Left ventricular ejection fraction, by estimation, is 60 to 65%. The left ventricle has normal function. The left ventricle has no regional wall motion abnormalities. Left ventricular diastolic parameters were normal.  2. Right ventricular systolic function is normal. The right ventricular size is normal.  3. Large circumferential pericardial effusion largest in posterior and lateral aspects of LV IVC not well seen no respiratory variation in mitral inflow and only RA diastolic collapse not RV. Given likely diagnosis of metastatic lung cancer consider CVTS consult for pericardial window and for both  Rx and diagnostic reasons . Large pericardial effusion. The pericardial effusion is circumferential.  4. The mitral valve is normal in structure. Trivial mitral valve regurgitation. No evidence of mitral stenosis.  5. The aortic valve is tricuspid. Aortic valve regurgitation is not visualized. Mild to moderate aortic valve sclerosis/calcification is present, without any evidence of aortic stenosis.  6. The inferior vena cava is normal in size with greater than 50% respiratory variability, suggesting right atrial pressure of 3 mmHg. FINDINGS  Left Ventricle: Left ventricular ejection fraction, by estimation, is 60 to 65%. The left ventricle has normal function. The left ventricle has no regional wall motion abnormalities. The left ventricular internal cavity size was normal in size. There is  no left ventricular hypertrophy. Left ventricular diastolic parameters were normal. Right Ventricle: The right ventricular size is normal. No increase in right ventricular wall thickness. Right ventricular systolic function is normal. Left Atrium: Left atrial size was normal in size. Right Atrium: Right atrial size was normal in size. Pericardium: Large circumferential pericardial effusion  largest in posterior and lateral aspects of LV IVC not well seen no respiratory variation in mitral inflow and only RA diastolic collapse not RV. Given likely diagnosis of metastatic lung cancer consider CVTS consult for pericardial window and for both Rx and diagnostic reasons. A large pericardial effusion is present. The pericardial effusion is circumferential. Mitral Valve: The mitral valve is normal in structure. There is mild thickening of the mitral valve leaflet(s). There is mild calcification of the mitral valve leaflet(s). Trivial mitral valve regurgitation. No evidence of mitral valve stenosis. Tricuspid Valve: The tricuspid valve is normal in structure. Tricuspid valve regurgitation is not demonstrated. No evidence of tricuspid stenosis. Aortic Valve: The aortic valve is tricuspid. Aortic valve regurgitation is not visualized. Mild to moderate aortic valve sclerosis/calcification is present, without any evidence of aortic stenosis. Aortic valve mean gradient measures 4.0 mmHg. Aortic valve peak gradient measures 5.9 mmHg. Aortic valve area, by VTI measures 2.93 cm. Pulmonic Valve: The pulmonic valve was normal in structure. Pulmonic valve regurgitation is not visualized. No evidence of pulmonic stenosis. Aorta: The aortic root is normal in size and structure. Venous: The inferior vena cava is normal in size with greater than 50% respiratory variability, suggesting right atrial pressure of 3 mmHg. IAS/Shunts: No atrial level shunt detected by color flow Doppler.  LEFT VENTRICLE PLAX 2D LVIDd:         4.10 cm   Diastology LVIDs:         2.60 cm   LV e' medial:    8.55 cm/s LV PW:         1.00 cm   LV E/e' medial:  10.2 LV IVS:        0.90 cm   LV e' lateral:   8.78 cm/s LVOT diam:     2.20 cm   LV E/e' lateral: 10.0 LV SV:         44 LV SV Index:   23 LVOT Area:     3.80 cm  RIGHT VENTRICLE RV Basal diam:  2.70 cm RV S prime:     25.50 cm/s TAPSE (M-mode): 2.8 cm LEFT ATRIUM             Index        RIGHT  ATRIUM           Index LA diam:        3.00 cm 1.60 cm/m   RA Area:     13.20  cm LA Vol (A2C):   21.3 ml 11.36 ml/m  RA Volume:   32.30 ml  17.22 ml/m LA Vol (A4C):   51.5 ml 27.46 ml/m LA Biplane Vol: 33.5 ml 17.86 ml/m  AORTIC VALVE AV Area (Vmax):    2.87 cm AV Area (Vmean):   2.66 cm AV Area (VTI):     2.93 cm AV Vmax:           121.00 cm/s AV Vmean:          93.200 cm/s AV VTI:            0.149 m AV Peak Grad:      5.9 mmHg AV Mean Grad:      4.0 mmHg LVOT Vmax:         91.30 cm/s LVOT Vmean:        65.100 cm/s LVOT VTI:          0.115 m LVOT/AV VTI ratio: 0.77  AORTA Ao Root diam: 2.90 cm Ao Asc diam:  3.10 cm MITRAL VALVE               TRICUSPID VALVE MV Area (PHT): 5.34 cm    TR Peak grad:   21.5 mmHg MV Decel Time: 142 msec    TR Vmax:        232.00 cm/s MV E velocity: 87.50 cm/s MV A velocity: 78.30 cm/s  SHUNTS MV E/A ratio:  1.12        Systemic VTI:  0.12 m                            Systemic Diam: 2.20 cm Jenkins Rouge MD Electronically signed by Jenkins Rouge MD Signature Date/Time: 05/15/2021/3:57:03 PM    Final      LOS: 3 days   Antonieta Pert, MD [facility]  06/01/2021, 11:13 AM

## 2021-05-19 NOTE — Anesthesia Postprocedure Evaluation (Signed)
Anesthesia Post Note  Patient: Christopher Henry  Procedure(s) Performed: PERICARDIAL WINDOW (Left)     Patient location during evaluation: PACU Anesthesia Type: General Level of consciousness: awake Pain management: pain level controlled Vital Signs Assessment: post-procedure vital signs reviewed and stable Respiratory status: spontaneous breathing, nonlabored ventilation, respiratory function stable and patient connected to nasal cannula oxygen Cardiovascular status: blood pressure returned to baseline and stable Postop Assessment: no apparent nausea or vomiting Anesthetic complications: no   No notable events documented.  Last Vitals:  Vitals:   05/30/2021 1800 05/23/2021 1900  BP: 131/81 (!) 144/87  Pulse: (!) 101 (!) 116  Resp: (!) 23 (!) 22  Temp: 36.7 C 37 C  SpO2: 98% 91%    Last Pain:  Vitals:   05/23/2021 1900  TempSrc: Oral  PainSc: 10-Worst pain ever                 Kele Barthelemy P Suha Schoenbeck

## 2021-05-19 NOTE — Consult Note (Signed)
Radiation Oncology         [phone] ________________________________  Name: Christopher Henry        MRN: [id number]  Date of Service: 05/15/2021 DOB: Sep 12, 1970  IH:[id number], Ramandeep, NP  No ref. provider found     REFERRING PHYSICIAN: No ref. provider found   DIAGNOSIS: The primary encounter diagnosis was Hypoxia. Diagnoses of Gastrointestinal hemorrhage, unspecified gastrointestinal hemorrhage type and Acute blood loss anemia were also pertinent to this visit.   HISTORY OF PRESENT ILLNESS: Christopher Henry is a 50 y.o. male seen at the request of Dr. Elsworth Soho for a probable locally advanced lung cancer. The patient has had unintended weight changes in the last year, as well as progressive shortness of breath and chest discomfort and presented on 05/27/2021 to the ED at [facility]. CTPA showed a large mediastinal mass involving the right hilum and encasing his airway measuring up to 5.8 cm, it is limited in its borders but there is also mediastinal adenopathy that can be distinguished measuring up to 2.7 cm of the right paratracheal station and possible extension into the carina.  The esophagus is not well visualized due to abutment of the mass and complete collapse of the right lower lobe with associated endobronchial debris/lesion was noted.  A 1.7 cm groundglass airspace opacity in the right apex was also noted, and the right main pulmonary artery had significant stenosis due to external mass-effect, there is a small to moderate volume pericardial effusion with left to right midline shift as well.  He was admitted and during further discussion has been also found to have hemoptysis and bright red blood per rectum.  He was going to go for EGD/colonoscopy but this was been rescheduled due to poor prep.  He also underwent bronchoscopic evaluation on 05/03/2021, pathology and cytology is pending. Dr. Bari Mantis notes indicate that a large mass was noted bulging from the subcarinal region indenting both sides of the carina  that the left mainstem could be traversed and distal airways were patent but that the right mainstem had a large endobronchial lesion prior to takeoff into the right upper lobe which appeared very vascular and could not be traversed.  Lavage and brushings were obtained, 2-3 biopsies were also obtained and hemostasis was achieved.  We have been contacted to consider radiotherapy to the chest.    PREVIOUS RADIATION THERAPY: No   PAST MEDICAL HISTORY:  Past Medical History:  Diagnosis Date   Anxiety state, unspecified 11/09/2012   Hypertension        PAST SURGICAL HISTORY: Past Surgical History:  Procedure Laterality Date   BRONCHIAL BRUSHINGS  05/09/2021   Procedure: BRONCHIAL BRUSHINGS;  Surgeon: Rigoberto Noel, MD;  Location: [facility] ENDOSCOPY;  Service: Cardiopulmonary;;   BRONCHIAL WASHINGS  05/15/2021   Procedure: BRONCHIAL WASHINGS;  Surgeon: Rigoberto Noel, MD;  Location: [facility] ENDOSCOPY;  Service: Cardiopulmonary;;   CRYOTHERAPY  05/14/2021   Procedure: CRYOTHERAPY;  Surgeon: Rigoberto Noel, MD;  Location: [facility] [facility] ENDOSCOPY;  Service: Cardiopulmonary;;   FINGER SURGERY     HEMOSTASIS CONTROL  05/23/2021   Procedure: HEMOSTASIS CONTROL;  Surgeon: Rigoberto Noel, MD;  Location: [place];  Service: Cardiopulmonary;;   HIP SURGERY     VIDEO BRONCHOSCOPY Bilateral 05/24/2021   Procedure: VIDEO BRONCHOSCOPY WITH  BIOPSY;  Surgeon: Rigoberto Noel, MD;  Location: [place];  Service: Cardiopulmonary;  Laterality: Bilateral;     FAMILY HISTORY:  Family History  Problem Relation Age of Onset   Hypertension Brother  Cancer Maternal Grandmother        unknown to pt     SOCIAL HISTORY:  reports that he quit smoking 7 days ago. His smoking use included cigarettes. He has a 70.00 pack-year smoking history. He has never used smokeless tobacco. He reports current alcohol use. He reports that he does not use drugs.  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  He is engaged to [employer], they have been in a relationship for more than 10 years.  He has 4 adult children.   ALLERGIES: Patient has no known allergies.   MEDICATIONS:  Current Facility-Administered Medications  Medication Dose Route Frequency Provider Last Rate Last Admin   (feeding supplement) PROSource Plus liquid 30 mL  30 mL Oral BID BM Kc, Ramesh, MD       0.9 %  sodium chloride infusion  10 mL/hr Intravenous Once Coralee Pesa, MD       acetaminophen (TYLENOL) 160 MG/5ML solution 650 mg  650 mg Oral Q6H PRN Kc, Ramesh, MD       albuterol (PROVENTIL) (2.5 MG/3ML) 0.083% nebulizer solution 2.5 mg  2.5 mg Nebulization Q4H PRN Orma Flaming, MD       azithromycin (ZITHROMAX) 500 mg in sodium chloride 0.9 % 250 mL IVPB  500 mg Intravenous Q24H Orma Flaming, MD 250 mL/hr at 05/18/21 2230 500 mg at 05/18/21 2230   cefTRIAXone (ROCEPHIN) 1 g in sodium chloride 0.9 % 100 mL IVPB  1 g Intravenous Q24H Orma Flaming, MD 200 mL/hr at 05/18/21 2215 1 g at 05/18/21 2215   colchicine tablet 0.6 mg  0.6 mg Oral Daily Reino Bellis B, NP       feeding supplement (BOOST / RESOURCE BREEZE) liquid 1 Container  1 Container Oral TID BM Antonieta Pert, MD   1 Container at 78/29/56 2130   folic acid (FOLVITE) tablet 1 mg  1 mg Oral Daily Irene Pap N, DO   1 mg at 05/18/21 8657   lactated ringers infusion   Intravenous Continuous Orma Flaming, MD 75 mL/hr at 05/09/2021 0615 Restarted at 05/11/2021 0615   LORazepam (ATIVAN) tablet 1-4 mg  1-4 mg Oral Q1H PRN Kayleen Memos, DO       Or   LORazepam (ATIVAN) injection 1-4 mg  1-4 mg Intravenous Q1H PRN Hall, Carole N, DO       mometasone-formoterol (DULERA) 200-5 MCG/ACT inhaler 2 puff  2 puff Inhalation BID Orma Flaming, MD   2 puff at 06/01/2021 0855   And   umeclidinium bromide (INCRUSE ELLIPTA) 62.5 MCG/INH 1 puff  1 puff Inhalation Daily Orma Flaming, MD   1 puff at 05/24/2021 0855   multivitamin with minerals tablet 1 tablet  1  tablet Oral Daily Irene Pap N, DO   1 tablet at 05/18/21 0902   pantoprazole (PROTONIX) EC tablet 40 mg  40 mg Oral BID Sharyn Creamer, MD   40 mg at 05/18/21 2212   potassium chloride SA (KLOR-CON) CR tablet 60 mEq  60 mEq Oral Once Reino Bellis B, NP       thiamine tablet 100 mg  100 mg Oral Daily Irene Pap N, DO   100 mg at 05/18/21 8469   Or   thiamine (B-1) injection 100 mg  100 mg Intravenous Daily Irene Pap N, DO         REVIEW OF SYSTEMS: On review of systems, the patient has been short of breath in  the last few weeks and more acutely during his presentation. He has lost between 15-20 pounds unintentionally in the last 6 months or so. Prior to his situation currently he's been a fairly healthy man. No other complaints are verbalized.      PHYSICAL EXAM:  Wt Readings from Last 3 Encounters:  05/14/2021 135 lb 2.3 oz (61.3 kg)  05/22/2021 131 lb (59.4 kg)  04/24/21 133 lb (60.3 kg)   Temp Readings from Last 3 Encounters:  05/07/2021 98.7 F (37.1 C) (Oral)  04/24/21 98.9 F (37.2 C) (Oral)  04/15/21 98.5 F (36.9 C) (Oral)   BP Readings from Last 3 Encounters:  05/05/2021 (!) 142/92  05/13/2021 114/64  04/24/21 132/84   Pulse Readings from Last 3 Encounters:  05/15/2021 (!) 113  05/27/2021 (!) 54  04/24/21 86   Pain Assessment Pain Score: 0-No pain/10  Unable to assess due to encounter type.   ECOG = 2  0 - Asymptomatic (Fully active, able to carry on all predisease activities without restriction)  1 - Symptomatic but completely ambulatory (Restricted in physically strenuous activity but ambulatory and able to carry out work of a light or sedentary nature. For example, light housework, office work)  2 - Symptomatic, <50% in bed during the day (Ambulatory and capable of all self care but unable to carry out any work activities. Up and about more than 50% of waking hours)  3 - Symptomatic, >50% in bed, but not bedbound (Capable of only limited self-care, confined  to bed or chair 50% or more of waking hours)  4 - Bedbound (Completely disabled. Cannot carry on any self-care. Totally confined to bed or chair)  5 - Death   Eustace Pen MM, Creech RH, Tormey DC, et al. [phone]). "Toxicity and response criteria of the [facility]". [place] Oncol. 5 (6): 649-55    LABORATORY DATA:  Lab Results  Component Value Date   WBC 30.4 (H) 05/10/2021   HGB 8.0 (L) 06/02/2021   HCT 24.3 (L) 05/10/2021   MCV 81.1 05/18/2021   PLT 455 (H) 05/04/2021   Lab Results  Component Value Date   NA 135 05/31/2021   K 3.4 (L) 05/08/2021   CL 106 05/31/2021   CO2 17 (L) 05/18/2021   Lab Results  Component Value Date   ALT 8 05/30/2021   AST 15 05/12/2021   ALKPHOS 61 05/13/2021   BILITOT 0.5 05/10/2021      RADIOGRAPHY: DG Chest 2 View  Result Date: 05/14/2021 CLINICAL DATA:  Shortness of breath EXAM: CHEST - 2 VIEW COMPARISON:  Radiograph 04/15/2021 FINDINGS: Unchanged cardiomediastinal silhouette. New right lower lobe airspace disease. No large pleural effusion or visible pneumothorax. There is prominence of the azygous vein and right paratracheal stripe. There is an unchanged compression deformity in midthoracic spine. No acute osseous abnormality. IMPRESSION: Right lower lobe pneumonia. Recommend follow-up chest radiograph in 6-12 weeks to ensure resolution. Electronically Signed   By: Maurine Simmering M.D.   On: 05/30/2021 14:21   CT Angio Chest PE W and/or Wo Contrast  Result Date: 05/26/2021 CLINICAL DATA:  Pt here via EMS with c/o SOB with exertion. History of chronic bronchitis. Pt at PCP with SpO2 of 80. Placed on 2 liters with improvement of 100%. Chest tingling. Pt reports rectal bleeding X1 week EXAM: CT ANGIOGRAPHY CHEST WITH CONTRAST TECHNIQUE: Multidetector CT imaging of the chest was performed using the standard protocol during bolus administration of intravenous contrast. Multiplanar CT image reconstructions and MIPs  were obtained  to evaluate the vascular anatomy. CONTRAST:  53mL OMNIPAQUE IOHEXOL 350 MG/ML SOLN COMPARISON:  Chest x-ray 05/15/2021, chest x-ray 04/15/2021 FINDINGS: Cardiovascular: Satisfactory opacification of the pulmonary arteries to the segmental level. No evidence of pulmonary embolism. The main pulmonary artery is normal in caliber. There is narrowing but no definite significant stenosis of the right main pulmonary artery due to external mass effect. Normal heart size. Small to moderate volume pericardial effusion. Left-to-right midline shift. Mediastinum/Nodes: Infiltrative mass of the mediastinum with markedly limited evaluation of its borders due to timing of contrast: Measures up to at least 5.8 cm on axial imaging (10:229). Mediastinal lymphadenopathy with as an example a 2.7 cm right paratracheal lymph node (2:58). No definite left hilar lymphadenopathy. No axillary nodes. Question extension of the mass into the carina (11:61). Thyroid gland demonstrate no significant findings. The esophagus is not well visualized due to abutment to the mass. Lungs/Pleura: Complete collapse of the right lower lobe with associated endobronchial debris/lesion. A 1.7 cm ground-glass airspace opacity at the right apex. Associated vague peripheral ground-glass airspace opacities within the right upper lobe. At least mild to moderate paraseptal and centrilobular emphysematous changes. No focal consolidation, pulmonary nodule, mass of the left lung. No left pleural effusion. No right pleural effusion. No pneumothorax. Upper Abdomen: No acute abnormality. Musculoskeletal: Minimally displaced subacute to chronic fracture of the mid sternal body. Associated underlying cirrhosis may represent pathologic fracture versus healing. Otherwise no suspicious lytic or blastic osseous lesions. No acute displaced fracture. Multilevel degenerative changes of the spine. Slight vertebral body height loss anteriorly of the T9 vertebral body. Review of the  MIP images confirms the above findings. IMPRESSION: 1. No pulmonary embolus; however, mild narrowing with no significant stenosis of the right main pulmonary artery due to external mass effect from the mass described below. 2. Infiltrative mass of the mediastinum with markedly limited evaluation of its borders due to timing of contrast. Associated complete collapse of the right lower lobe with associated complete right lower lobe bronchi occlusion due to endobronchial debris/lesion. Question extension of the mass into the carina.Associated left-to-right midline shift due to decreased lung volume. Concern for underlying malignancy - likely of the mediastinum with extent into or around the right mainstem bronchi leading to collapse the right lower lobe. Additional imaging evaluation or consultation with Pulmonology or Thoracic Surgery recommended. 3. Mediastinal lymphadenopathy. 4. A 1.7 cm ground-glass airspace opacity at the right apex. Associated vague peripheral ground-glass airspace opacities within the right upper lobe. 5. Small to moderate volume pericardial effusion. 6. Subacute to chronic fracture of the mid sternal body. Underlying sclerosis likely due to healing; however, pathologic fracture is not fully excluded. 7. Emphysema (ICD10-J43.9). Electronically Signed   By: Iven Finn M.D.   On: 05/25/2021 18:12   ECHOCARDIOGRAM COMPLETE  Result Date: 05/24/2021    ECHOCARDIOGRAM REPORT   Patient Name:   YOVANNI FRENETTE Date of Exam: 05/27/2021 Medical Rec #:  [id number]    Height:       76.0 in Accession #:    [id number]   Weight:       135.1 lb Date of Birth:  [birth year]     BSA:          1.875 m Patient Age:    50 years     BP:           108/78 mmHg Patient Gender: M            HR:  107 bpm. Exam Location:  Inpatient Procedure: 2D Echo and 3D Echo Indications:    Pericardial effusion  History:        Patient has no prior history of Echocardiogram examinations.                 COPD; Risk  Factors:Current Smoker. ETOH abuse.  Sonographer:    Clayton Lefort RDCS (AE) Referring Phys: [phone] RAKESH V ALVA  Sonographer Comments: Suboptimal subcostal window. IMPRESSIONS  1. Left ventricular ejection fraction, by estimation, is 60 to 65%. The left ventricle has normal function. The left ventricle has no regional wall motion abnormalities. Left ventricular diastolic parameters were normal.  2. Right ventricular systolic function is normal. The right ventricular size is normal.  3. Large circumferential pericardial effusion largest in posterior and lateral aspects of LV IVC not well seen no respiratory variation in mitral inflow and only RA diastolic collapse not RV. Given likely diagnosis of metastatic lung cancer consider CVTS consult for pericardial window and for both Rx and diagnostic reasons . Large pericardial effusion. The pericardial effusion is circumferential.  4. The mitral valve is normal in structure. Trivial mitral valve regurgitation. No evidence of mitral stenosis.  5. The aortic valve is tricuspid. Aortic valve regurgitation is not visualized. Mild to moderate aortic valve sclerosis/calcification is present, without any evidence of aortic stenosis.  6. The inferior vena cava is normal in size with greater than 50% respiratory variability, suggesting right atrial pressure of 3 mmHg. FINDINGS  Left Ventricle: Left ventricular ejection fraction, by estimation, is 60 to 65%. The left ventricle has normal function. The left ventricle has no regional wall motion abnormalities. The left ventricular internal cavity size was normal in size. There is  no left ventricular hypertrophy. Left ventricular diastolic parameters were normal. Right Ventricle: The right ventricular size is normal. No increase in right ventricular wall thickness. Right ventricular systolic function is normal. Left Atrium: Left atrial size was normal in size. Right Atrium: Right atrial size was normal in size. Pericardium: Large  circumferential pericardial effusion largest in posterior and lateral aspects of LV IVC not well seen no respiratory variation in mitral inflow and only RA diastolic collapse not RV. Given likely diagnosis of metastatic lung cancer consider CVTS consult for pericardial window and for both Rx and diagnostic reasons. A large pericardial effusion is present. The pericardial effusion is circumferential. Mitral Valve: The mitral valve is normal in structure. There is mild thickening of the mitral valve leaflet(s). There is mild calcification of the mitral valve leaflet(s). Trivial mitral valve regurgitation. No evidence of mitral valve stenosis. Tricuspid Valve: The tricuspid valve is normal in structure. Tricuspid valve regurgitation is not demonstrated. No evidence of tricuspid stenosis. Aortic Valve: The aortic valve is tricuspid. Aortic valve regurgitation is not visualized. Mild to moderate aortic valve sclerosis/calcification is present, without any evidence of aortic stenosis. Aortic valve mean gradient measures 4.0 mmHg. Aortic valve peak gradient measures 5.9 mmHg. Aortic valve area, by VTI measures 2.93 cm. Pulmonic Valve: The pulmonic valve was normal in structure. Pulmonic valve regurgitation is not visualized. No evidence of pulmonic stenosis. Aorta: The aortic root is normal in size and structure. Venous: The inferior vena cava is normal in size with greater than 50% respiratory variability, suggesting right atrial pressure of 3 mmHg. IAS/Shunts: No atrial level shunt detected by color flow Doppler.  LEFT VENTRICLE PLAX 2D LVIDd:         4.10 cm   Diastology LVIDs:  2.60 cm   LV e' medial:    8.55 cm/s LV PW:         1.00 cm   LV E/e' medial:  10.2 LV IVS:        0.90 cm   LV e' lateral:   8.78 cm/s LVOT diam:     2.20 cm   LV E/e' lateral: 10.0 LV SV:         44 LV SV Index:   23 LVOT Area:     3.80 cm  RIGHT VENTRICLE RV Basal diam:  2.70 cm RV S prime:     25.50 cm/s TAPSE (M-mode): 2.8 cm LEFT  ATRIUM             Index        RIGHT ATRIUM           Index LA diam:        3.00 cm 1.60 cm/m   RA Area:     13.20 cm LA Vol (A2C):   21.3 ml 11.36 ml/m  RA Volume:   32.30 ml  17.22 ml/m LA Vol (A4C):   51.5 ml 27.46 ml/m LA Biplane Vol: 33.5 ml 17.86 ml/m  AORTIC VALVE AV Area (Vmax):    2.87 cm AV Area (Vmean):   2.66 cm AV Area (VTI):     2.93 cm AV Vmax:           121.00 cm/s AV Vmean:          93.200 cm/s AV VTI:            0.149 m AV Peak Grad:      5.9 mmHg AV Mean Grad:      4.0 mmHg LVOT Vmax:         91.30 cm/s LVOT Vmean:        65.100 cm/s LVOT VTI:          0.115 m LVOT/AV VTI ratio: 0.77  AORTA Ao Root diam: 2.90 cm Ao Asc diam:  3.10 cm MITRAL VALVE               TRICUSPID VALVE MV Area (PHT): 5.34 cm    TR Peak grad:   21.5 mmHg MV Decel Time: 142 msec    TR Vmax:        232.00 cm/s MV E velocity: 87.50 cm/s MV A velocity: 78.30 cm/s  SHUNTS MV E/A ratio:  1.12        Systemic VTI:  0.12 m                            Systemic Diam: 2.20 cm Jenkins Rouge MD Electronically signed by Jenkins Rouge MD Signature Date/Time: 05/27/2021/3:57:03 PM    Final        IMPRESSION/PLAN: 1. 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 Dr. Lisbeth Renshaw has reviewed the patient's course to date and imaging thus far. We are awaiting pathology/cytology confirmation of suspected NSCLC of the right lung. I left a voicemail for the patient this morning, and later morning I was able ot reach his fiance Luana Shu. We discussed that if this is indeed lung cancer, that PET imaging and MRI brain work up would be needed. Dr. Lisbeth Renshaw has reviewed his clinical situation and is in favor of starting urgent radiotherapy in the next day.  We discussed the risks, benefits, short, and long term effects of radiotherapy, as well as the curative intent, and the  patient's fiance is interested in proceeding. I  discussed the delivery and logistics of radiotherapy and Dr. Lisbeth Renshaw anticipates a course of up top 6 1/2  weeks of  radiotherapy. We will follow along with his course today as he's going for robotic pericardial window. If he is stable for transfer to [facility] we will start with simulation and subsequent treatment with radiotherapy tomorrow. Written consent will be obtained at the time of simulation.  2. GI bleed. The patient will also be proceeding with EGD/Colonoscopy with Dr. Lorenso Courier or Dr. Lyndel Safe. We will follow this expectantly.  In a visit lasting 70 minutes, greater than 50% of the time was spent by phone and in floor time discussing the patient's condition, in preparation for the discussion, and coordinating the patient's care.       Carola Rhine, [facility]   **Disclaimer: This note was dictated with voice recognition software. Similar sounding words can inadvertently be transcribed and this note may contain transcription errors which may not have been corrected upon publication of note.**

## 2021-05-19 NOTE — Anesthesia Procedure Notes (Addendum)
Procedure Name: Intubation Date/Time: 05/26/2021 1:44 PM Performed by: Ardyth Harps, CRNA Pre-anesthesia Checklist: Patient identified, Emergency Drugs available, Suction available and Patient being monitored Patient Re-evaluated:Patient Re-evaluated prior to induction Oxygen Delivery Method: Circle System Utilized Preoxygenation: Pre-oxygenation with 100% oxygen Induction Type: IV induction Ventilation: Mask ventilation without difficulty Laryngoscope Size: Mac and 4 Grade View: Grade I Endobronchial tube: Left and Double lumen EBT and 41 Fr Number of attempts: 1 Airway Equipment and Method: Stylet Placement Confirmation: ETT inserted through vocal cords under direct vision, positive ETCO2 and breath sounds checked- equal and bilateral Tube secured with: Tape Dental Injury: Teeth and Oropharynx as per pre-operative assessment  Comments: Unable to secure proper DLT positioning with bronch due to altered anatomy/edema. Removed and single ETT placed.

## 2021-05-20 ENCOUNTER — Inpatient Hospital Stay (HOSPITAL_COMMUNITY): Payer: 59 | Admitting: Anesthesiology

## 2021-05-20 ENCOUNTER — Ambulatory Visit
Admit: 2021-05-20 | Discharge: 2021-05-20 | Disposition: A | Discharge status: Home / Self Care | Payer: 59 | Attending: Radiation Oncology | Admitting: Radiation Oncology

## 2021-05-20 ENCOUNTER — Encounter (HOSPITAL_COMMUNITY): Admission: EM | Disposition: E | Payer: Self-pay | Source: Ambulatory Visit | Attending: Family Medicine

## 2021-05-20 ENCOUNTER — Inpatient Hospital Stay (HOSPITAL_COMMUNITY): Payer: 59

## 2021-05-20 ENCOUNTER — Encounter (HOSPITAL_COMMUNITY): Payer: Self-pay | Admitting: Thoracic Surgery (Cardiothoracic Vascular Surgery)

## 2021-05-20 DIAGNOSIS — B3781 Candidal esophagitis: Secondary | ICD-10-CM

## 2021-05-20 DIAGNOSIS — J9859 Other diseases of mediastinum, not elsewhere classified: Secondary | ICD-10-CM | POA: Diagnosis not present

## 2021-05-20 DIAGNOSIS — I3131 Malignant pericardial effusion in diseases classified elsewhere: Secondary | ICD-10-CM | POA: Diagnosis not present

## 2021-05-20 DIAGNOSIS — K635 Polyp of colon: Secondary | ICD-10-CM | POA: Diagnosis not present

## 2021-05-20 DIAGNOSIS — K2100 Gastro-esophageal reflux disease with esophagitis, without bleeding: Secondary | ICD-10-CM

## 2021-05-20 DIAGNOSIS — D62 Acute posthemorrhagic anemia: Secondary | ICD-10-CM | POA: Diagnosis not present

## 2021-05-20 DIAGNOSIS — K625 Hemorrhage of anus and rectum: Secondary | ICD-10-CM | POA: Diagnosis not present

## 2021-05-20 HISTORY — PX: POLYPECTOMY: SHX5525

## 2021-05-20 HISTORY — PX: BIOPSY: SHX5522

## 2021-05-20 HISTORY — PX: ESOPHAGOGASTRODUODENOSCOPY (EGD) WITH PROPOFOL: SHX5813

## 2021-05-20 HISTORY — PX: COLONOSCOPY WITH PROPOFOL: SHX5780

## 2021-05-20 LAB — CBC
HCT: 25.7 % — ABNORMAL LOW (ref 39.0–52.0)
Hemoglobin: 8.2 g/dL — ABNORMAL LOW (ref 13.0–17.0)
MCH: 25.9 pg — ABNORMAL LOW (ref 26.0–34.0)
MCHC: 31.9 g/dL (ref 30.0–36.0)
MCV: 81.1 fL (ref 80.0–100.0)
Platelets: 521 10*3/uL — ABNORMAL HIGH (ref 150–400)
RBC: 3.17 MIL/uL — ABNORMAL LOW (ref 4.22–5.81)
RDW: 18.6 % — ABNORMAL HIGH (ref 11.5–15.5)
WBC: 35.1 10*3/uL — ABNORMAL HIGH (ref 4.0–10.5)
nRBC: 0 % (ref 0.0–0.2)

## 2021-05-20 LAB — BASIC METABOLIC PANEL
Anion gap: 11 (ref 5–15)
BUN: 12 mg/dL (ref 6–20)
CO2: 19 mmol/L — ABNORMAL LOW (ref 22–32)
Calcium: 8.2 mg/dL — ABNORMAL LOW (ref 8.9–10.3)
Chloride: 108 mmol/L (ref 98–111)
Creatinine, Ser: 1.08 mg/dL (ref 0.61–1.24)
GFR, Estimated: >60 mL/min (ref >60)
Glucose, Bld: 142 mg/dL — ABNORMAL HIGH (ref 70–99)
Potassium: 4 mmol/L (ref 3.5–5.1)
Sodium: 138 mmol/L (ref 135–145)

## 2021-05-20 SURGERY — ESOPHAGOGASTRODUODENOSCOPY (EGD) WITH PROPOFOL
Anesthesia: Monitor Anesthesia Care

## 2021-05-20 MED ORDER — PHENYLEPHRINE HCL (PRESSORS) 10 MG/ML IV SOLN
INTRAVENOUS | Status: DC | PRN
  Administered 2021-05-20 (×4): 80 ug via INTRAVENOUS

## 2021-05-20 MED ORDER — PROPOFOL 10 MG/ML IV BOLUS
INTRAVENOUS | Status: DC | PRN
Start: 2021-05-20 — End: 2021-05-20
  Administered 2021-05-20: 10 mg via INTRAVENOUS
  Administered 2021-05-20: 20 mg via INTRAVENOUS
  Administered 2021-05-20 (×3): 10 mg via INTRAVENOUS

## 2021-05-20 MED ORDER — PSYLLIUM 95 % PO PACK
1.0000 | PACK | Freq: Every day | ORAL | Status: DC
  Administered 2021-05-21 – 2021-05-28 (×3): 1 via ORAL
  Filled 2021-05-20 (×11): qty 1

## 2021-05-20 MED ORDER — HYDROCORT-PRAMOXINE (PERIANAL) 1-1 % EX FOAM
1.0000 | Freq: Two times a day (BID) | CUTANEOUS | Status: DC
  Administered 2021-05-20 – 2021-06-02 (×19): 1 via RECTAL
  Filled 2021-05-20 (×7): qty 10

## 2021-05-20 MED ORDER — PROPOFOL 500 MG/50ML IV EMUL
INTRAVENOUS | Status: DC | PRN
  Administered 2021-05-20: 100 ug/kg/min via INTRAVENOUS

## 2021-05-20 SURGICAL SUPPLY — 25 items

## 2021-05-20 NOTE — Progress Notes (Signed)
Pt came back to rm 24 from [place]. Reinitiated tele. VSS. Call bell within reach.   Lavenia Atlas, RN

## 2021-05-20 NOTE — Progress Notes (Addendum)
      [address]       [place] [address]             [phone]      Day of Surgery Procedure(s) (LRB): ESOPHAGOGASTRODUODENOSCOPY (EGD) WITH PROPOFOL (N/A) COLONOSCOPY WITH PROPOFOL (N/A) BIOPSY POLYPECTOMY Subjective: Just returned from EGD and colonoscopy this morning. He is hungry and anxious to resume his diet .  Pain controlled, has frequent cough. Not so=hort of breath at rest.   Objective: Vital signs in last 24 hours: Temp:  [97.6 F (36.4 C)-98.7 F (37.1 C)] 97.9 F (36.6 C) (10/18 0923) Pulse Rate:  [92-116] 105 (10/18 0923) Cardiac Rhythm: Sinus tachycardia (10/18 0920) Resp:  [19-25] 20 (10/18 0923) BP: (96-149)/(58-98) 134/82 (10/18 0923) SpO2:  [91 %-100 %] 97 % (10/18 0923) Arterial Line BP: (173-175)/(74) 173/74 (10/17 1515)     Intake/Output from previous day: 10/17 0701 - 10/18 0700 In: 2195.6 [I.V.:900; IV Piggyback:1295.6] Out: 976 [Urine:500; Blood:50; Chest Tube:426] Intake/Output this shift: Total I/O In: 400 [I.V.:400] Out: -   General appearance: alert, cooperative, and no distress Neurologic: intact Heart: regular rate and rhythm Lungs: breath sounds coarse. Frequent cough. The left pleural and pericardial tubes are in Y configuration and drained combined 435ml overnight. No air leak.  Wound: the left anterior mini thoracotomy incision is well approximated and dry.   Lab Results: Recent Labs    05/18/2021 0430 05/05/2021 0832 05/04/2021 0231  WBC 30.4*  --  35.1*  HGB 8.4* 8.0* 8.2*  HCT 26.1* 24.3* 25.7*  PLT 455*  --  521*   BMET:  Recent Labs    05/31/2021 0430 05/09/2021 0231  NA 135 138  K 3.4* 4.0  CL 106 108  CO2 17* 19*  GLUCOSE 101* 142*  BUN 12 12  CREATININE 1.27* 1.08  CALCIUM 8.4* 8.2*    PT/INR: No results for input(s): LABPROT, INR in the last 72 hours. ABG No results found for: PHART, HCO3, TCO2, ACIDBASEDEF, O2SAT CBG (last 3)  No results for input(s): GLUCAP in the last 72  hours.  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 Pain control is reasonable. Had significant drainage overnight--will leave both tubes on water seal. Path on the pericardial fluid and pericardium is pending. Mr. Earnshaw is to begin radiation treatment today at [facility] and will return to [facility] following the treatment. We will continue to follow.   LOS: 4 days    Antony Odea, [place] [phone] 05/09/2021  Continue chest tube to suction Dalesha Stanback O Alyissa Whidbee

## 2021-05-20 NOTE — Op Note (Signed)
[facility] Patient Name: Christopher Henry Procedure Date : 05/14/2021 MRN: [id number] Attending MD: Jackquline Denmark , MD Date of Birth: 1971/08/02 CSN: [id number] Age: 50 Admit Type: Inpatient Procedure:                Colonoscopy Indications:              Rectal bleeding. Recently diagnosed with invasive                            mediastinal mass, likely lung CA (Bx from                            bronchoscopy pending) Providers:                Jackquline Denmark, MD, Vista Lawman, RN, Hinton Dyer Referring MD:              Medicines:                Monitored Anesthesia Care Complications:            No immediate complications. Estimated Blood Loss:     Estimated blood loss: none. Procedure:                Pre-Anesthesia Assessment:                           - Prior to the procedure, a History and Physical                            was performed, and patient medications and                            allergies were reviewed. The patient's tolerance of                            previous anesthesia was also reviewed. The risks                            and benefits of the procedure and the sedation                            options and risks were discussed with the patient.                            All questions were answered, and informed consent                            was obtained. Prior Anticoagulants: The patient has                            taken no previous anticoagulant or antiplatelet                            agents. ASA Grade Assessment: IV - A patient with  severe systemic disease that is a constant threat                            to life. After reviewing the risks and benefits,                            the patient was deemed in satisfactory condition to                            undergo the procedure.                           After obtaining informed consent, the colonoscope                            was passed under direct  vision. Throughout the                            procedure, the patient's blood pressure, pulse, and                            oxygen saturations were monitored continuously. The                            [id number]-HQ190L ([id number]) Olympus colonoscope was                            introduced through the anus and advanced to the 2                            cm into the ileum. The colonoscopy was performed                            without difficulty. The patient tolerated the                            procedure well. The quality of the bowel                            preparation was good. The terminal ileum, ileocecal                            valve, appendiceal orifice, and rectum were                            photographed. Scope In: 8:15:57 AM Scope Out: 8:34:52 AM Scope Withdrawal Time: 0 hours 12 minutes 51 seconds  Total Procedure Duration: 0 hours 18 minutes 55 seconds  Findings:      Large prolapsed gd IV internal hemorrhoids were found on perianal exam       which would bleed easily on rectal exam.      A 6 mm polyp was found in the mid descending colon. The polyp was       sessile. The polyp was removed with a cold snare. Resection  and       retrieval were complete.      The terminal ileum appeared normal.      The exam was otherwise without abnormality on direct and retroflexion       views. Impression:               - Large prolapsed grade IV internal hemorrhoids                            found on perianal exam (etiology of rectal bleeding)                           - One 6 mm polyp in the mid descending colon,                            removed with a cold snare. Resected and retrieved.                           - The examined portion of the ileum was normal.                           - The examination was otherwise normal on direct                            and retroflexion views. Recommendation:           - Resume previous diet.                           - Continue  present medications.                           - ProctoFoam-HC: Apply externally TID for 2 weeks.                           - Fiber supplements                           - Consider sitz baths (not sure if he would be able                            to do that)                           - Limited medical options. If he continues to have                            rectal bleeding, would recommend surgical                            consultation to see options                           - The findings and recommendations were discussed  with the patient's family. Procedure Code(s):        --- Professional ---                           [phone], Colonoscopy, flexible; with removal of                            tumor(s), polyp(s), or other lesion(s) by snare                            technique Diagnosis Code(s):        --- Professional ---                           K63.5, Polyp of colon                           K64.9, Unspecified hemorrhoids                           K62.5, Hemorrhage of anus and rectum CPT copyright 2019 American Medical Association. All rights reserved. The codes documented in this report are preliminary and upon coder review may  be revised to meet current compliance requirements. Jackquline Denmark, MD 05/31/2021 9:05:00 AM This report has been signed electronically. Number of Addenda: 0

## 2021-05-20 NOTE — Progress Notes (Signed)
Pt came back to rm 24 from PACU. Put the the chest back to -20cm suction. VSS. Call bell within reach.  Lavenia Atlas, RN

## 2021-05-20 NOTE — Addendum Note (Signed)
Addendum  created 05/23/2021 1531 by Belinda Block, MD   Intraprocedure Staff edited

## 2021-05-20 NOTE — Interval H&P Note (Signed)
History and Physical Interval Note:  05/15/2021 7:28 AM  [name]  has presented today for surgery, with the diagnosis of Hematochezia, melena.  The various methods of treatment have been discussed with the patient and family. After consideration of risks, benefits and other options for treatment, the patient has consented to  Procedure(s): ESOPHAGOGASTRODUODENOSCOPY (EGD) WITH PROPOFOL (N/A) COLONOSCOPY WITH PROPOFOL (N/A) as a surgical intervention.  The patient's history has been reviewed, patient examined, no change in status, stable for surgery.  I have reviewed the patient's chart and labs.  Questions were answered to the patient's satisfaction.     Christopher Henry

## 2021-05-20 NOTE — Op Note (Signed)
[facility] Patient Name: Christopher Henry Procedure Date : 05/28/2021 MRN: [id number] Attending MD: Jackquline Denmark , MD Date of Birth: 1971-03-04 CSN: [id number] Age: 50 Admit Type: Inpatient Procedure:                Upper GI endoscopy Indications:              Recent gastrointestinal bleeding. Pt with invasive                            mediastinal mass, suspected lung CA (biopsies from                            bronchoscopy pending) Providers:                Jackquline Denmark, MD, Vista Lawman, RN, Hinton Dyer Referring MD:              Medicines:                Monitored Anesthesia Care Complications:            No immediate complications. Estimated Blood Loss:     Estimated blood loss: none. Procedure:                Pre-Anesthesia Assessment:                           - Prior to the procedure, a History and Physical                            was performed, and patient medications and                            allergies were reviewed. The patient's tolerance of                            previous anesthesia was also reviewed. The risks                            and benefits of the procedure and the sedation                            options and risks were discussed with the patient.                            All questions were answered, and informed consent                            was obtained. Prior Anticoagulants: The patient has                            taken no previous anticoagulant or antiplatelet                            agents. ASA Grade Assessment: IV - A patient with  severe systemic disease that is a constant threat                            to life. After reviewing the risks and benefits,                            the patient was deemed in satisfactory condition to                            undergo the procedure.                           After obtaining informed consent, the endoscope was                            passed  under direct vision. Throughout the                            procedure, the patient's blood pressure, pulse, and                            oxygen saturations were monitored continuously. The                            [id number]-[id number] ([id number]) Olympus endoscope was introduced                            through the mouth, and advanced to the second part                            of duodenum. The upper GI endoscopy was                            accomplished without difficulty. The patient                            tolerated the procedure well. Scope In: Scope Out: Findings:      LA Grade A (one or more mucosal breaks less than 5 mm, not extending       between tops of 2 mucosal folds) esophagitis with no bleeding was found       42 cm from the incisors. 2-3 small whitish lesions likely mild Candida       esophagitis were noted at the GE junction as well. Multiple biopsies       were taken with a cold forceps for histology.      The entire examined stomach was normal.      The examined duodenum was normal. Impression:               - LA Grade A reflux and mild (suspected)                            candidiasis esophagitis with no bleeding. Biopsied.                           -  No UGI bleeding. Recommendation:           - Resume previous diet.                           - Continue present medications.                           - Await pathology results. If + for Candida, will                            treat.                           - Would continue Protonix 40 mg p.o. once a day for                            now                           - The findings and recommendations were discussed                            with the patient. Procedure Code(s):        --- Professional ---                           [phone], Esophagogastroduodenoscopy, flexible,                            transoral; with biopsy, single or multiple Diagnosis Code(s):        --- Professional ---                            K21.00, Gastro-esophageal reflux disease with                            esophagitis, without bleeding                           B37.81, Candidal esophagitis                           K92.2, Gastrointestinal hemorrhage, unspecified CPT copyright 2019 American Medical Association. All rights reserved. The codes documented in this report are preliminary and upon coder review may  be revised to meet current compliance requirements. Jackquline Denmark, MD 05/08/2021 8:58:15 AM This report has been signed electronically. Number of Addenda: 0

## 2021-05-20 NOTE — Progress Notes (Addendum)
Progress Note  Patient Name: Christopher Henry Date of Encounter: 05/14/2021  [facility] Cardiologist: Evalina Field, MD   Subjective   Back from colonoscopy/EGD. Tolerating diet.   Inpatient Medications    Scheduled Meds:  (feeding supplement) PROSource Plus  30 mL Oral BID BM   colchicine  0.6 mg Oral Daily   feeding supplement  1 Container Oral TID BM   folic acid  1 mg Oral Daily   hydrocortisone-pramoxine  1 applicator Rectal BID   mometasone-formoterol  2 puff Inhalation BID   And   umeclidinium bromide  1 puff Inhalation Daily   multivitamin with minerals  1 tablet Oral Daily   pantoprazole  40 mg Oral BID   [START ON 05/21/2021] psyllium  1 packet Oral Daily   thiamine  100 mg Oral Daily   Or   thiamine  100 mg Intravenous Daily   traMADol  50 mg Oral Q6H   Continuous Infusions:  sodium chloride     azithromycin Stopped (05/24/2021 2315)   cefTRIAXone (ROCEPHIN)  IV Stopped (05/12/2021 2204)   lactated ringers 0 mL (05/04/2021 1800)   PRN Meds: acetaminophen (TYLENOL) oral liquid 160 mg/5 mL, albuterol, melatonin, morphine injection   Vital Signs    Vitals:   05/15/2021 0845 05/07/2021 0900 05/14/2021 0911 05/12/2021 0923  BP: 96/67 (!) 97/58 124/76 134/82  Pulse: 92 96 98 (!) 105  Resp: 20 (!) 23 (!) 22 20  Temp: 98 F (36.7 C)  98 F (36.7 C) 97.9 F (36.6 C)  TempSrc:    Oral  SpO2: 100% 100% 100% 97%  Weight:        Intake/Output Summary (Last 24 hours) at 05/31/2021 1020 Last data filed at 05/30/2021 0845 Gross per 24 hour  Intake 2595.6 ml  Output 976 ml  Net 1619.6 ml   Last 3 Weights 05/15/2021 05/18/2021 04/24/2021  Weight (lbs) 135 lb 2.3 oz 131 lb 133 lb  Weight (kg) 61.3 kg 59.421 kg 60.328 kg      Telemetry    ST - Personally Reviewed  ECG    No new tracing  Physical Exam   GEN: Thin, appears older than stated age, No acute distress.   Neck: No JVD Cardiac: Tachy, no murmurs, rubs, or gallops.  Respiratory: Clear to  auscultation bilaterally. CT/mediastinal drain to left chest GI: Soft, nontender, non-distended  MS: No edema; No deformity. Neuro:  Nonfocal  Psych: Normal affect   Labs    High Sensitivity Troponin:   Recent Labs  Lab 05/09/2021 1604 05/04/2021 1755  TROPONINIHS 49* 8     Chemistry Recent Labs  Lab 05/18/2021 1604 05/13/2021 0743 05/18/21 0221 05/08/2021 0430 05/26/2021 0231  NA 134*   < > 134* 135 138  K 4.2   < > 4.0 3.4* 4.0  CL 101   < > 105 106 108  CO2 19*   < > 20* 17* 19*  GLUCOSE 111*   < > 107* 101* 142*  BUN 31*   < > 20 12 12   CREATININE 1.58*   < > 1.29* 1.27* 1.08  CALCIUM 8.8*   < > 8.1* 8.4* 8.2*  PROT 7.6  --   --   --   --   ALBUMIN 3.3*  --   --   --   --   AST 15  --   --   --   --   ALT 8  --   --   --   --  ALKPHOS 61  --   --   --   --   BILITOT 0.5  --   --   --   --   GFRNONAA 53*   < > >60 >60 >60  ANIONGAP 14   < > 9 12 11    < > = values in this interval not displayed.    Lipids No results for input(s): CHOL, TRIG, HDL, LABVLDL, LDLCALC, CHOLHDL in the last 168 hours.  Hematology Recent Labs  Lab 05/18/21 0221 05/18/21 0947 05/22/2021 0430 05/10/2021 0832 05/09/2021 0231  WBC 25.5*  --  30.4*  --  35.1*  RBC 2.67*  2.63*  --  3.22*  --  3.17*  HGB 6.8*   < > 8.4* 8.0* 8.2*  HCT 20.9*   < > 26.1* 24.3* 25.7*  MCV 78.3*  --  81.1  --  81.1  MCH 25.5*  --  26.1  --  25.9*  MCHC 32.5  --  32.2  --  31.9  RDW 18.1*  --  18.5*  --  18.6*  PLT 389  --  455*  --  521*   < > = values in this interval not displayed.   Thyroid No results for input(s): TSH, FREET4 in the last 168 hours.  BNP Recent Labs  Lab 05/30/2021 1341  BNP 68.6    DDimer  Recent Labs  Lab 05/06/2021 1415  DDIMER 3.52*     Radiology    DG CHEST PORT 1 VIEW  Result Date: 05/13/2021 CLINICAL DATA:  Pericardial effusion. EXAM: PORTABLE CHEST 1 VIEW COMPARISON:  May 19, 2021 FINDINGS: The cardiomediastinal silhouette is unchanged and enlarged in contour.Pericardial  drain. LEFT-sided chest tube. Small RIGHT pleural effusion, similar in comparison to prior. No significant pneumothorax. Persistent interstitial prominence and haziness of the basilar vasculature most consistent with pulmonary edema and scattered atelectasis. Visualized abdomen is unremarkable. LEFT-sided subcutaneous air. IMPRESSION: Pericardial drain and LEFT-sided chest tube without significant pneumothorax. Persistent small RIGHT pleural effusion. Electronically Signed   By: Valentino Saxon M.D.   On: 05/21/2021 08:16   DG CHEST PORT 1 VIEW  Result Date: 05/15/2021 CLINICAL DATA:  Pericardial effusion EXAM: PORTABLE CHEST 1 VIEW COMPARISON:  Chest x-ray dated May 16, 2021 FINDINGS: Cardiac and mediastinal contours are unchanged. Unchanged narrowing of the right mainstem bronchus, likely due to known mediastinal adenopathy. New mediastinal drain and left-sided chest tube. Small right pleural effusion and atelectasis. No pneumothorax. IMPRESSION: Interval placement of mediastinal drain and left-sided chest tube. No evidence of pneumothorax. Small right pleural effusion and atelectasis. Electronically Signed   By: Yetta Glassman M.D.   On: 05/24/2021 15:52    Cardiac Studies   Echo 05/11/2021: 1. Left ventricular ejection fraction, by estimation, is 60 to 65%. The  left ventricle has normal function. The left ventricle has no regional  wall motion abnormalities. Left ventricular diastolic parameters were  normal.   2. Right ventricular systolic function is normal. The right ventricular  size is normal.   3. Large circumferential pericardial effusion largest in posterior and  lateral aspects of LV IVC not well seen no respiratory variation in mitral  inflow and only RA diastolic collapse not RV. Given likely diagnosis of  metastatic lung cancer consider  CVTS consult for pericardial window and for both Rx and diagnostic reasons  . Large pericardial effusion. The pericardial effusion is  circumferential.   4. The mitral valve is normal in structure. Trivial mitral valve  regurgitation. No evidence of mitral  stenosis.   5. The aortic valve is tricuspid. Aortic valve regurgitation is not  visualized. Mild to moderate aortic valve sclerosis/calcification is  present, without any evidence of aortic stenosis.   6. The inferior vena cava is normal in size with greater than 50%  respiratory variability, suggesting right atrial pressure of 3 mmHg.   Patient Profile     50 y.o. male  with a hx of tobacco abuse, COPD, HTN, heavy alcohol use, and anxiety who is being seen 05/18/2021 for the evaluation of pericardial effusion in the setting of suspected metastatic lung cancer at the request of Dr. Lupita Leash.  Assessment & Plan    Malignant Pericardial Effusion: Echo 10/15 with large pericardial effusion with RA collapse. Images were reviewed with TCTS per Dr. Marisue Ivan.  --Seen by [name] s/p pericardial window 10/18, unable to complete robotic thoracotomy 2/2 inability to place dual lumen ETT with airway obstruction.   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 Biopsies pending -- PCCM following -- seen by radiation oncology with plans for transfer to [facility] today   Hematochezia/melena: Heavy ETOH use along with daily NSAIDs PTA. Hgb dropped to 6.9 on admission s/p 2 units PRBC. Evaluated by GI, underwent colonoscopy/EGD with large prolapsed internal hemorrhoids, removal of 5mm polyp from colon and suspected candidiasis esophagitis (no bleeding) pending path results. No upper GI bleeding noted.  -- PPI daily  -- Hgb stable at 8.2   Per primary: Heavy ETOH use/tobacco use: reports recent cessation before admission Severe protein deficiency Sepsis 2/2 PNA   For questions or updates, please contact [facility] Please consult [email or web address] for contact info  under        Signed, Reino Bellis, NP  05/13/2021, 10:20 AM    I have examined the patient and reviewed assessment and plan and discussed with patient.  Agree with above as stated.    Doing well post pericardial window.  He is still concerned about his hemorrhoids.  He will check with GI regarding this issue.   Larae Grooms

## 2021-05-20 NOTE — Progress Notes (Signed)
Called [facility] to arrange transportation. Called report to [facility]. Pt going to outpt radiation department and came back to [id number].   Lavenia Atlas, RN

## 2021-05-20 NOTE — Progress Notes (Signed)
PROGRESS NOTE    Christopher Henry  ASN:[id number] DOB: [birth year] DOA: 05/23/2021 PCP: Bary Castilla, NP   Chief Complaint  Patient presents with   Shortness of Breath    Brief Narrative/Hospital Course:  Christopher Henry, 50 y.o. male with PMH of COPD, HTN, alcohol abuse, tobacco use 1 pack/day x 35 years sent from pulmonary office to ED for acute respiratory distress hypoxic 80% on room air, nonproductive cough and hematemesis over the past 2 months, burning sensation on the chest and greater than 50 pound weight loss x12 months. In the ED, anemic hemoglobin 6.9 g, leukocytosis, D-dimer 3.5, initial troponin 49 lactic acidosis 2.4 creatinine 1.5 chest x-ray RLL pneumonia. CT 18/14 no PE, infiltrative mass of the mediastinum, complete collapse of the right lower lobe with associated complete right lower lobe bronchi occlusion due to endobronchial debris/lesion,?  Mass extension into the carina, left to right midline shift due to decreased lung volume-concern for malignancy likely of the mediastinum with extension into or around the right mainstem bronchi leading to collapse of the right lower lobe, mediastinal lymphadenopathy, 1.7 cm groundglass opacity right apex, small to moderate volume pericardial effusion, subacute to chronic fracture of the medial sternal body. 10/15 underwent bronchoscopy-showed large subcarinal mass indenting the carina and bulging and bilaterally with large mainstem lesion obscuring 50% of lumen, biopsies taken. Large pericardial effusion noted in the echo, cardiology and thoracic surgery following S/p open pericardial window by [name] 10/17.   Subjective: Seen and examined this morning. Patient underwent EGD colonoscopy this morning.  Found to have large prolapsed grade 4 rectal hemorrhoids likely cause of his rectal bleeding. Afebrile overnight.  On room air. Hemoglobin at 8.2 g this morning.  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  Underwent bronchoscopy 10/15-biopsy result pending. 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 10/17.  Patient has 2 drains in place, will be managed by cardiothoracic surgery at [facility].  Acute hypoxemic respiratory failure hypoxic 80% on room air with respiratory distress on admission: Due to pneumonia/mediastinal mass: On room air currently.    Sepsis secondary to pneumonia RLL post obstructive pneumonia: In the setting of malignancy.  Remains intermittently febrile also with leukocytosis-leukocytosis further uptrending.  On ceftriaxone/azithromycin, ?? Edcalate abx-but has been afebrile since 10/16 evening- pulmonary following.  Recent Labs  Lab 05/27/2021 1341 05/03/2021 1604 05/31/2021 1755 05/29/2021 2054 05/06/2021 0743 05/18/21 0221 05/25/2021 0430 05/24/2021 0231  WBC 30.8*  --   --   --  25.3* 25.5* 30.4* 35.1*  LATICACIDVEN  --  2.4* 2.7*  --   --   --   --   --   PROCALCITON  --   --   --  0.19 0.25 0.25  --   --    Hematochezia/melena Acute blood loss anemia in the setting of painless rectal bleeding due to hemorrhoids Microcytic anemia suspect anemia of chronic disease as well History of chronic hemorrhoids-which he was able to reduce at home: Status post multiple PRBC transfusions.  H&H this morning improved to 82. underwent EGD colonoscopy this morning shows mild esophagitis-could be Candida biopsy pending, large  grade 4 prolapsed hemorrhoids-if he has recurrent bleeding may need surgical consultation for hemorrhoids.  Continue H prep, sitz bath if able to tolerate. Monitor h/h Recent Labs  Lab 05/18/21 1409 05/18/21 1958 05/29/2021 0430 05/06/2021 0832 06/02/2021 0231  HGB 7.9* 7.9* 8.4* 8.0* 8.2*  HCT 24.2* 23.7* 26.1* 24.3* 25.7*    AKI likely in the setting of dehydration/pneumonia/malignancy.  There is Recent Labs  Lab 05/23/2021 1604 05/18/2021 0743 05/18/21 0221 05/25/2021 0430 05/12/2021 0231  BUN 31* 27* 20 12 12   CREATININE 1.58* 1.22 1.29* 1.27* 1.08     Anion gap metabolic acidosis due to AKI: Bicarb at 19. Monitor Hypokalemia :resolved COPD: Currently not in exacerbation.Continue Dulera,Incruse Ellipta and bronchodilators as needed  Severe protein calorie moderation with significant weight loss and BMI 16.45.  In the setting of malignancy overall poor prognostic indicator.  History of alcohol use/tobacco use patient reports he recently quit.  Continue thiamine folic acid multivitamins and watch for withdrawals.  DVT prophylaxis: SCDs Start: 06/02/2021 2105.  No chemeical prophylaxis due to anemia Code Status:   Code Status: Full Code Family Communication: plan of care discussed with patient his fiance and the mother at bedside. Overall prognosis guarded/poor  Status is: Inpatient Remains inpatient appropriate because: Ongoing management of anemia, pericardial effusion, lung mass pneumonia.  Objective: Vitals: Today's Vitals   05/25/2021 0845 05/04/2021 0900 05/18/2021 0911 05/25/2021 0923  BP: 96/67 (!) 97/58 124/76 134/82  Pulse: 92 96 98 (!) 105  Resp: 20 (!) 23 (!) 22 20  Temp: 98 F (36.7 C)  98 F (36.7 C) 97.9 F (36.6 C)  TempSrc:    Oral  SpO2: 100% 100% 100% 97%  Weight:      PainSc: 0-No pain  0-No pain 0-No pain   Physical Examination: General exam: AAOx 3, older than stated age, weak appearing. HEENT:Oral mucosa moist, Ear/Nose WNL grossly, dentition  normal. Respiratory system: bilaterally diminished, no use of accessory muscle.  Left chest wall with dressing in place with drains with serosanguineous fluid. Cardiovascular system: S1 & S2 +, No JVD,. Gastrointestinal system: Abdomen soft, NT,ND, BS+ Nervous System:Alert, awake, moving extremities and grossly nonfocal Extremities: No edema, distal peripheral pulses palpable.  Skin: No rashes,no icterus. MSK: Normal muscle bulk,tone, power   Medications reviewed:  Scheduled Meds:  (feeding supplement) PROSource Plus  30 mL Oral BID BM   colchicine  0.6 mg Oral Daily   feeding supplement  1 Container Oral TID BM   folic acid  1 mg Oral Daily   hydrocortisone-pramoxine  1 applicator Rectal BID   mometasone-formoterol  2 puff Inhalation BID   And   umeclidinium bromide  1 puff Inhalation Daily   multivitamin with minerals  1 tablet Oral Daily   pantoprazole  40 mg Oral BID   [START ON 05/21/2021] psyllium  1 packet Oral Daily   thiamine  100 mg Oral Daily   Or   thiamine  100 mg Intravenous Daily   traMADol  50 mg Oral Q6H   Continuous Infusions:  sodium chloride     azithromycin Stopped (05/31/2021 2315)   cefTRIAXone (ROCEPHIN)  IV Stopped (05/25/2021 2204)   lactated ringers 0 mL (05/30/2021 1800)    Diet Order             Diet regular Room service appropriate? Yes; Fluid consistency: Thin  Diet effective now  Nutrition Problem: Inadequate oral intake Etiology: poor appetite Signs/Symptoms: per patient/family report Interventions: MVI, Boost Breeze, Prostat  Intake/Output  Intake/Output Summary (Last 24 hours) at 05/15/2021 1043 Last data filed at 05/30/2021 0845 Gross per 24 hour  Intake 2595.6 ml  Output 976 ml  Net 1619.6 ml    Intake/Output from previous day: 10/17 0701 - 10/18 0700 In: 2195.6 [I.V.:900; IV Piggyback:1295.6] Out: 976 [Urine:500; Blood:50; Chest Tube:426] Net IO Since Admission: 5,105.31 mL [05/24/2021 1043]   Weight  change:   Wt Readings from Last 3 Encounters:  05/07/2021 61.3 kg  05/14/2021 59.4 kg  04/24/21 60.3 kg     Consultants:see note  Procedures:see note Antimicrobials: Anti-infectives (From admission, onward)    Start     Dose/Rate Route Frequency Ordered Stop   05/05/2021 2145  cefTRIAXone (ROCEPHIN) 1 g in sodium chloride 0.9 % 100 mL IVPB        1 g 200 mL/hr over 30 Minutes Intravenous Every 24 hours 05/07/2021 2053     05/13/2021 2145  azithromycin (ZITHROMAX) 500 mg in sodium chloride 0.9 % 250 mL IVPB        500 mg 250 mL/hr over 60 Minutes Intravenous Every 24 hours 05/28/2021 2053 05/21/21 2144   05/31/2021 1545  ceFEPIme (MAXIPIME) 2 g in sodium chloride 0.9 % 100 mL IVPB        2 g 200 mL/hr over 30 Minutes Intravenous  Once 05/15/2021 1539 05/14/2021 1640      Culture/Microbiology    Component Value Date/Time   SDES BLOOD LEFT ANTECUBITAL 06/01/2021 1604   SPECREQUEST  05/18/2021 1604    BOTTLES DRAWN AEROBIC AND ANAEROBIC Blood Culture results may not be optimal due to an inadequate volume of blood received in culture bottles   CULT  05/22/2021 1604    NO GROWTH 4 DAYS Performed at [facility], [place] [address], [place], [place] [address]    REPTSTATUS PENDING 05/18/2021 1604    Other culture-see note  Unresulted Labs (From admission, onward)     Start     Ordered   05/10/2021 0017  Basic metabolic panel  Daily,   R     Question:  Specimen collection method  Answer:  Lab=Lab collect   05/18/21 1143   05/09/2021 0500  CBC  Daily,   R     Question:  Specimen collection method  Answer:  Lab=Lab collect   05/18/21 1143   05/15/2021 1536  Urinalysis, Routine w reflex microscopic Urine, Clean Catch  (Undifferentiated presentation (screening labs and basic nursing orders))  ONCE - STAT,   STAT        05/15/2021 1538           Data Reviewed: I have personally reviewed following labs and imaging studies CBC: Recent Labs  Lab 05/30/2021 1341 05/03/2021 2050 05/30/2021 0743  05/27/2021 1413 05/18/21 0221 05/18/21 0947 05/18/21 1409 05/18/21 1958 05/11/2021 0430 05/11/2021 0832 05/27/2021 0231  WBC 30.8*  --  25.3*  --  25.5*  --   --   --  30.4*  --  35.1*  NEUTROABS 26.8*  --   --   --   --   --   --   --   --   --   --   HGB 6.9*   < > 7.8*   < > 6.8*   < > 7.9* 7.9* 8.4* 8.0* 8.2*  HCT 21.6*   < > 23.0*   < > 20.9*   < >  24.2* 23.7* 26.1* 24.3* 25.7*  MCV 79.1*  --  77.2*  --  78.3*  --   --   --  81.1  --  81.1  PLT 534*  --  393  --  389  --   --   --  455*  --  521*   < > = values in this interval not displayed.    Basic Metabolic Panel: Recent Labs  Lab 05/18/2021 1604 05/09/2021 0743 05/18/21 0221 05/18/2021 0430 05/14/2021 0231  NA 134* 136 134* 135 138  K 4.2 4.0 4.0 3.4* 4.0  CL 101 105 105 106 108  CO2 19* 19* 20* 17* 19*  GLUCOSE 111* 104* 107* 101* 142*  BUN 31* 27* 20 12 12   CREATININE 1.58* 1.22 1.29* 1.27* 1.08  CALCIUM 8.8* 8.4* 8.1* 8.4* 8.2*    GFR: Estimated Creatinine Clearance: 70.9 mL/min (by C-G formula based on SCr of 1.08 mg/dL). Liver Function Tests: Recent Labs  Lab 06/01/2021 1604  AST 15  ALT 8  ALKPHOS 61  BILITOT 0.5  PROT 7.6  ALBUMIN 3.3*    Recent Labs  Lab 05/22/2021 1604  LIPASE 29    Recent Labs  Lab 05/06/2021 1604  AMMONIA <10    Coagulation Profile: Recent Labs  Lab 05/18/2021 1604  INR 1.4*    Cardiac Enzymes: No results for input(s): CKTOTAL, CKMB, CKMBINDEX, TROPONINI in the last 168 hours. BNP (last 3 results) No results for input(s): PROBNP in the last 8760 hours. HbA1C: No results for input(s): HGBA1C in the last 72 hours. CBG: No results for input(s): GLUCAP in the last 168 hours. Lipid Profile: No results for input(s): CHOL, HDL, LDLCALC, TRIG, CHOLHDL, LDLDIRECT in the last 72 hours. Thyroid Function Tests: No results for input(s): TSH, T4TOTAL, FREET4, T3FREE, THYROIDAB in the last 72 hours. Anemia Panel: Recent Labs    05/18/21 0221  FERRITIN 74  TIBC 280  IRON 10*   RETICCTPCT 3.0    Sepsis Labs: Recent Labs  Lab 05/11/2021 1604 05/15/2021 1755 05/12/2021 2054 05/03/2021 0743 05/18/21 0221  PROCALCITON  --   --  0.19 0.25 0.25  LATICACIDVEN 2.4* 2.7*  --   --   --      Recent Results (from the past 240 hour(s))  Resp Panel by RT-PCR (Flu A&B, Covid) Nasopharyngeal Swab     Status: None   Collection Time: 06/01/2021  3:38 PM   Specimen: Nasopharyngeal Swab; Nasopharyngeal(NP) swabs in vial transport medium  Result Value Ref Range Status   SARS Coronavirus 2 by RT PCR NEGATIVE NEGATIVE Final    Comment: (NOTE) SARS-CoV-2 target nucleic acids are NOT DETECTED.  The SARS-CoV-2 RNA is generally detectable in upper respiratory specimens during the acute phase of infection. The lowest concentration of SARS-CoV-2 viral copies this assay can detect is 138 copies/mL. A negative result does not preclude SARS-Cov-2 infection and should not be used as the sole basis for treatment or other patient management decisions. A negative result may occur with  improper specimen collection/handling, submission of specimen other than nasopharyngeal swab, presence of viral mutation(s) within the areas targeted by this assay, and inadequate number of viral copies(<138 copies/mL). A negative result must be combined with clinical observations, patient history, and epidemiological information. The expected result is Negative.  Fact Sheet for Patients:  EntrepreneurPulse.com.au  Fact Sheet for Healthcare Providers:  IncredibleEmployment.be  This test is no t yet approved or cleared by the [place] FDA and  has been authorized for detection and/or  diagnosis of SARS-CoV-2 by FDA under an Emergency Use Authorization (EUA). This EUA will remain  in effect (meaning this test can be used) for the duration of the COVID-19 declaration under Section 564(b)(1) of the Act, 21 U.S.C.section 360bbb-3(b)(1), unless the authorization is  terminated  or revoked sooner.       Influenza A by PCR NEGATIVE NEGATIVE Final   Influenza B by PCR NEGATIVE NEGATIVE Final    Comment: (NOTE) The Xpert Xpress SARS-CoV-2/FLU/RSV plus assay is intended as an aid in the diagnosis of influenza from Nasopharyngeal swab specimens and should not be used as a sole basis for treatment. Nasal washings and aspirates are unacceptable for Xpert Xpress SARS-CoV-2/FLU/RSV testing.  Fact Sheet for Patients: EntrepreneurPulse.com.au  Fact Sheet for Healthcare Providers: IncredibleEmployment.be  This test is not yet approved or cleared by the [place] FDA and has been authorized for detection and/or diagnosis of SARS-CoV-2 by FDA under an Emergency Use Authorization (EUA). This EUA will remain in effect (meaning this test can be used) for the duration of the COVID-19 declaration under Section 564(b)(1) of the Act, 21 U.S.C. section 360bbb-3(b)(1), unless the authorization is terminated or revoked.  Performed at [facility], [place] [address] [place], [place] [address]   Blood culture (routine single)     Status: None (Preliminary result)   Collection Time: 05/26/2021  4:04 PM   Specimen: BLOOD  Result Value Ref Range Status   Specimen Description BLOOD LEFT ANTECUBITAL  Final   Special Requests   Final    BOTTLES DRAWN AEROBIC AND ANAEROBIC Blood Culture results may not be optimal due to an inadequate volume of blood received in culture bottles   Culture   Final    NO GROWTH 4 DAYS Performed at [facility], [place] [address] [place], [place] [address]    Report Status PENDING  Incomplete      Radiology Studies: DG CHEST PORT 1 VIEW  Result Date: 06/01/2021 CLINICAL DATA:  Pericardial effusion. EXAM: PORTABLE CHEST 1 VIEW COMPARISON:  May 19, 2021 FINDINGS: The cardiomediastinal silhouette is unchanged and enlarged in contour.Pericardial drain. LEFT-sided chest tube. Small RIGHT  pleural effusion, similar in comparison to prior. No significant pneumothorax. Persistent interstitial prominence and haziness of the basilar vasculature most consistent with pulmonary edema and scattered atelectasis. Visualized abdomen is unremarkable. LEFT-sided subcutaneous air. IMPRESSION: Pericardial drain and LEFT-sided chest tube without significant pneumothorax. Persistent small RIGHT pleural effusion. Electronically Signed   By: Valentino Saxon M.D.   On: 05/22/2021 08:16   DG CHEST PORT 1 VIEW  Result Date: 05/07/2021 CLINICAL DATA:  Pericardial effusion EXAM: PORTABLE CHEST 1 VIEW COMPARISON:  Chest x-ray dated May 16, 2021 FINDINGS: Cardiac and mediastinal contours are unchanged. Unchanged narrowing of the right mainstem bronchus, likely due to known mediastinal adenopathy. New mediastinal drain and left-sided chest tube. Small right pleural effusion and atelectasis. No pneumothorax. IMPRESSION: Interval placement of mediastinal drain and left-sided chest tube. No evidence of pneumothorax. Small right pleural effusion and atelectasis. Electronically Signed   By: Yetta Glassman M.D.   On: 05/25/2021 15:52     LOS: 4 days   Antonieta Pert, MD [facility]  05/07/2021, 10:43 AM

## 2021-05-20 NOTE — Transfer of Care (Signed)
Immediate Anesthesia Transfer of Care Note  Patient: Christopher Henry  Procedure(s) Performed: ESOPHAGOGASTRODUODENOSCOPY (EGD) WITH PROPOFOL COLONOSCOPY WITH PROPOFOL BIOPSY POLYPECTOMY  Patient Location: PACU  Anesthesia Type:MAC  Level of Consciousness: awake, drowsy and patient cooperative  Airway & Oxygen Therapy: Patient Spontanous Breathing and Patient connected to nasal cannula oxygen  Post-op Assessment: Report given to RN, Post -op Vital signs reviewed and stable and Patient moving all extremities X 4  Post vital signs: Reviewed and stable  Last Vitals:  Vitals Value Taken Time  BP 88/62 05/03/2021 0843  Temp    Pulse    Resp 20 05/13/2021 0844  SpO2    Vitals shown include unvalidated device data.  Last Pain:  Vitals:   05/05/2021 0715  TempSrc: Temporal  PainSc: 5          Complications: No notable events documented.

## 2021-05-21 ENCOUNTER — Ambulatory Visit
Admit: 2021-05-21 | Discharge: 2021-05-21 | Disposition: A | Discharge status: Home / Self Care | Payer: 59 | Attending: Radiation Oncology | Admitting: Radiation Oncology

## 2021-05-21 ENCOUNTER — Encounter (HOSPITAL_COMMUNITY): Payer: Self-pay | Admitting: Gastroenterology

## 2021-05-21 DIAGNOSIS — C3490 Malignant neoplasm of unspecified part of unspecified bronchus or lung: Secondary | ICD-10-CM | POA: Diagnosis not present

## 2021-05-21 DIAGNOSIS — D62 Acute posthemorrhagic anemia: Secondary | ICD-10-CM | POA: Diagnosis not present

## 2021-05-21 DIAGNOSIS — J90 Pleural effusion, not elsewhere classified: Secondary | ICD-10-CM | POA: Diagnosis not present

## 2021-05-21 LAB — BASIC METABOLIC PANEL
Anion gap: 7 (ref 5–15)
BUN: 18 mg/dL (ref 6–20)
CO2: 19 mmol/L — ABNORMAL LOW (ref 22–32)
Calcium: 8.2 mg/dL — ABNORMAL LOW (ref 8.9–10.3)
Chloride: 112 mmol/L — ABNORMAL HIGH (ref 98–111)
Creatinine, Ser: 1.09 mg/dL (ref 0.61–1.24)
GFR, Estimated: >60 mL/min (ref >60)
Glucose, Bld: 111 mg/dL — ABNORMAL HIGH (ref 70–99)
Potassium: 3.2 mmol/L — ABNORMAL LOW (ref 3.5–5.1)
Sodium: 138 mmol/L (ref 135–145)

## 2021-05-21 LAB — CBC
HCT: 26.1 % — ABNORMAL LOW (ref 39.0–52.0)
Hemoglobin: 8 g/dL — ABNORMAL LOW (ref 13.0–17.0)
MCH: 25.4 pg — ABNORMAL LOW (ref 26.0–34.0)
MCHC: 30.7 g/dL (ref 30.0–36.0)
MCV: 82.9 fL (ref 80.0–100.0)
Platelets: 570 10*3/uL — ABNORMAL HIGH (ref 150–400)
RBC: 3.15 MIL/uL — ABNORMAL LOW (ref 4.22–5.81)
RDW: 19 % — ABNORMAL HIGH (ref 11.5–15.5)
WBC: 28.7 10*3/uL — ABNORMAL HIGH (ref 4.0–10.5)
nRBC: 0.1 % (ref 0.0–0.2)

## 2021-05-21 LAB — SURGICAL PATHOLOGY

## 2021-05-21 LAB — CULTURE, BLOOD (SINGLE): Culture: NO GROWTH

## 2021-05-21 MED ORDER — DOCUSATE SODIUM 100 MG PO CAPS: 100.0000 mg | ORAL_CAPSULE | Freq: Every day | ORAL | Status: DC | PRN

## 2021-05-21 MED ORDER — POTASSIUM CHLORIDE CRYS ER 20 MEQ PO TBCR
40.0000 meq | EXTENDED_RELEASE_TABLET | Freq: Once | ORAL | Status: AC
  Administered 2021-05-21: 40 meq via ORAL
  Filled 2021-05-21: qty 2

## 2021-05-21 MED ORDER — POLYETHYLENE GLYCOL 3350 17 G PO PACK: 17.0000 g | PACK | Freq: Every day | ORAL | Status: DC | PRN

## 2021-05-21 MED ORDER — POTASSIUM CHLORIDE CRYS ER 20 MEQ PO TBCR
20.0000 meq | EXTENDED_RELEASE_TABLET | Freq: Every day | ORAL | Status: DC
  Administered 2021-05-21 – 2021-06-01 (×12): 20 meq via ORAL
  Filled 2021-05-21 (×12): qty 1

## 2021-05-21 MED ORDER — WITCH HAZEL-GLYCERIN EX PADS
MEDICATED_PAD | CUTANEOUS | Status: DC | PRN
  Filled 2021-05-21: qty 100

## 2021-05-21 NOTE — Consult Note (Signed)
Consult Note  Christopher Henry 1971-05-16  [id number].    Requesting MD: Dr. 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  He has had anemia during admission with hemoglobin 6.9 upon presentation on 10/14.  In addition to the above he has had worsening hematochezia and pain from his known hemorrhoids.  Patient states he has had hemorrhoids since he was a teenager with intermittent prolapse that he has always been able to reduce.  He states he has had band ligation in the past which failed.  GI has seen patient during admission and he underwent colonoscopy and EGD on 05/31/2021 with findings of grade 4 prolapsed internal hemorrhoids. GI recommended Proctofoam and sitz bath's at that time.  Patient has had ongoing severe pain with hemorrhoids not being able to be reduced.  General surgery was asked to see. He states pain is ongoing. His bowel movements have been loose.  Substance use: tobacco 1PPD x35 years Allergies: NKDA   ROS: Review of Systems  Constitutional:  Positive for weight loss. Negative for chills and fever.  Respiratory:  Positive for shortness of breath. Negative for cough and wheezing.   Cardiovascular:  Negative for chest pain and leg swelling.  Gastrointestinal:  Positive for blood in stool and diarrhea. Negative for abdominal pain, constipation, nausea and vomiting.  Genitourinary: Negative.    Family History  Problem Relation Age of Onset   Hypertension Brother    Cancer Maternal Grandmother        unknown to pt    Past Medical History:  Diagnosis Date   Anxiety state, unspecified 11/09/2012   Hypertension     Past Surgical History:  Procedure Laterality Date   BRONCHIAL BRUSHINGS  05/25/2021    Procedure: BRONCHIAL BRUSHINGS;  Surgeon: Rigoberto Noel, MD;  Location: [facility] ENDOSCOPY;  Service: Cardiopulmonary;;   BRONCHIAL WASHINGS  05/10/2021   Procedure: BRONCHIAL WASHINGS;  Surgeon: Rigoberto Noel, MD;  Location: [facility] ENDOSCOPY;  Service: Cardiopulmonary;;   CRYOTHERAPY  05/14/2021   Procedure: CRYOTHERAPY;  Surgeon: Rigoberto Noel, MD;  Location: [facility] ENDOSCOPY;  Service: Cardiopulmonary;;   FINGER SURGERY     HEMOSTASIS CONTROL  05/18/2021   Procedure: HEMOSTASIS CONTROL;  Surgeon: Rigoberto Noel, MD;  Location: [facility] ENDOSCOPY;  Service: Cardiopulmonary;;   HIP SURGERY     VIDEO BRONCHOSCOPY Bilateral 05/21/2021   Procedure: VIDEO BRONCHOSCOPY WITH  BIOPSY;  Surgeon: Rigoberto Noel, MD;  Location: [place];  Service: Cardiopulmonary;  Laterality: Bilateral;   XI ROBOTIC ASSISTED PERICARDIAL WINDOW Left 05/23/2021   Procedure: PERICARDIAL WINDOW;  Surgeon: Lajuana Matte, MD;  Location: [place];  Service: Thoracic;  Laterality: Left;    Social History:  reports that he quit smoking 9 days ago. His smoking use included cigarettes. He has a 70.00 pack-year smoking history. He has never used smokeless tobacco. He reports current alcohol use. He reports that he does not use drugs.  Allergies: No Known Allergies  Medications Prior to Admission  Medication Sig Dispense Refill   amLODipine (NORVASC) 5 MG tablet Take 1 tablet (5 mg total) by mouth daily. 30 tablet 1   betamethasone dipropionate (DIPROLENE) 0.05 % ointment Apply topically 2 (two) times daily. 30 g 0   Budeson-Glycopyrrol-Formoterol (BREZTRI AEROSPHERE) 160-9-4.8 MCG/ACT AERO Inhale 160 mcg  into the lungs daily. (Patient taking differently: Inhale 160 mcg into the lungs in the morning and at bedtime.) 1 g 1   IBUPROFEN PO Take 1 tablet by mouth daily as needed (headache, pain).      Blood pressure 133/79, pulse (!) 107, temperature 98.7 F (37.1 C), temperature source Oral, resp. rate 20, weight 61.3 kg, SpO2 96  %. Physical Exam:  General: pleasant, WD, male who is laying in bed in NAD HEENT: head is normocephalic, atraumatic.  Sclera are noninjected.  Pupils equal and round.  Ears and nose without any masses or lesions.  Mouth is pink and moist Heart: regular, rate, and rhythm.  Normal s1,s2. No obvious murmurs, gallops, or rubs noted.  Palpable radial and pedal pulses bilaterally Lungs: CTAB, no wheezes, rhonchi, or rales noted.  Respiratory effort nonlabored Abd: soft, NT, ND, +BS, no masses, hernias, or organomegaly GU: large prolapsed hemorrhoids with minimal bleeding. No surrounding erythema or induration. No obvious fissures. Tender on exam MS: all 4 extremities are symmetrical with no cyanosis, clubbing, or edema. Skin: warm and dry with no masses, lesions, or rashes Neuro: Cranial nerves 2-12 grossly intact, sensation is normal throughout Psych: A&Ox3 with an appropriate affect.   Results for orders placed or performed during the hospital encounter of 05/22/2021 (from the past 48 hour(s))  Basic metabolic panel     Status: Abnormal   Collection Time: 05/08/2021  2:31 AM  Result Value Ref Range   Sodium 138 135 - 145 mmol/L   Potassium 4.0 3.5 - 5.1 mmol/L   Chloride 108 98 - 111 mmol/L   CO2 19 (L) 22 - 32 mmol/L   Glucose, Bld 142 (H) 70 - 99 mg/dL    Comment: Glucose reference range applies only to samples taken after fasting for at least 8 hours.   BUN 12 6 - 20 mg/dL   Creatinine, Ser 1.08 0.61 - 1.24 mg/dL   Calcium 8.2 (L) 8.9 - 10.3 mg/dL   GFR, Estimated >60 >60 mL/min    Comment: (NOTE) Calculated using the CKD-EPI Creatinine Equation (2021)    Anion gap 11 5 - 15    Comment: Performed at [facility] [address] [place], [place] [address]  CBC     Status: Abnormal   Collection Time: 05/22/2021  2:31 AM  Result Value Ref Range   WBC 35.1 (H) 4.0 - 10.5 K/uL   RBC 3.17 (L) 4.22 - 5.81 MIL/uL   Hemoglobin 8.2 (L) 13.0 - 17.0 g/dL   HCT 25.7 (L) 39.0 - 52.0 %   MCV  81.1 80.0 - 100.0 fL   MCH 25.9 (L) 26.0 - 34.0 pg   MCHC 31.9 30.0 - 36.0 g/dL   RDW 18.6 (H) 11.5 - 15.5 %   Platelets 521 (H) 150 - 400 K/uL   nRBC 0.0 0.0 - 0.2 %    Comment: Performed at [facility] [address], [place], [place] [address]  Basic metabolic panel     Status: Abnormal   Collection Time: 05/21/21  1:38 AM  Result Value Ref Range   Sodium 138 135 - 145 mmol/L   Potassium 3.2 (L) 3.5 - 5.1 mmol/L    Comment: DELTA CHECK NOTED   Chloride 112 (H) 98 - 111 mmol/L   CO2 19 (L) 22 - 32 mmol/L   Glucose, Bld 111 (H) 70 - 99 mg/dL    Comment: Glucose reference range applies only to samples taken after fasting for at  least 8 hours.   BUN 18 6 - 20 mg/dL   Creatinine, Ser 1.09 0.61 - 1.24 mg/dL   Calcium 8.2 (L) 8.9 - 10.3 mg/dL   GFR, Estimated >60 >60 mL/min    Comment: (NOTE) Calculated using the CKD-EPI Creatinine Equation (2021)    Anion gap 7 5 - 15    Comment: Performed at [facility] [address] [place], [place] [address]  CBC     Status: Abnormal   Collection Time: 05/21/21  1:38 AM  Result Value Ref Range   WBC 28.7 (H) 4.0 - 10.5 K/uL   RBC 3.15 (L) 4.22 - 5.81 MIL/uL   Hemoglobin 8.0 (L) 13.0 - 17.0 g/dL   HCT 26.1 (L) 39.0 - 52.0 %   MCV 82.9 80.0 - 100.0 fL   MCH 25.4 (L) 26.0 - 34.0 pg   MCHC 30.7 30.0 - 36.0 g/dL   RDW 19.0 (H) 11.5 - 15.5 %   Platelets 570 (H) 150 - 400 K/uL   nRBC 0.1 0.0 - 0.2 %    Comment: Performed at [facility] [address] [place], [place] [address]   DG CHEST PORT 1 VIEW  Result Date: 06/01/2021 CLINICAL DATA:  Pericardial effusion. EXAM: PORTABLE CHEST 1 VIEW COMPARISON:  May 19, 2021 FINDINGS: The cardiomediastinal silhouette is unchanged and enlarged in contour.Pericardial drain. LEFT-sided chest tube. Small RIGHT pleural effusion, similar in comparison to prior. No significant pneumothorax. Persistent interstitial prominence and haziness of the basilar vasculature most consistent  with pulmonary edema and scattered atelectasis. Visualized abdomen is unremarkable. LEFT-sided subcutaneous air. IMPRESSION: Pericardial drain and LEFT-sided chest tube without significant pneumothorax. Persistent small RIGHT pleural effusion. Electronically Signed   By: Valentino Saxon M.D.   On: 05/12/2021 08:16   DG CHEST PORT 1 VIEW  Result Date: 05/12/2021 CLINICAL DATA:  Pericardial effusion EXAM: PORTABLE CHEST 1 VIEW COMPARISON:  Chest x-ray dated May 16, 2021 FINDINGS: Cardiac and mediastinal contours are unchanged. Unchanged narrowing of the right mainstem bronchus, likely due to known mediastinal adenopathy. New mediastinal drain and left-sided chest tube. Small right pleural effusion and atelectasis. No pneumothorax. IMPRESSION: Interval placement of mediastinal drain and left-sided chest tube. No evidence of pneumothorax. Small right pleural effusion and atelectasis. Electronically Signed   By: Yetta Glassman M.D.   On: 05/05/2021 15:52      Assessment/Plan Prolapsed grade IV internal hemorrhoids - s/p colonoscopy on 10/18 - Hemoglobin remaining stable around 8 - continue fiber supplement and proctofoam as per GI - recommend sitz baths at least TID to start - add colace prn and miralax prn and discussed importance of constipation prevention- currently having soft BM - could consider scheduled tylenol for better pain control - add prn Tuck's pads  Emergent/urgent surgical management not indicated at this time. Additionally, given his medical comorbidities and current treatment plan he is at increased risk for poor wound healing. Surgery at this point would also not resolve his pain which is his biggest concern as he would have ongoing post operative pain for some period of time. Hopefully the further he gets from colonoscopy he will have continued pain improvement  We will sign off at this time but please do not hesitate to reach out to use with any questions or  concerns  FEN: regular ID: [email or web address] VTE: none  Per primary Large subcarinal/mediastinal mass  RUL nodule RLL atelectasis with endobronchial lesion Sepsis/post obstructive PNA AKI COPD  Winferd Humphrey, [facility]  05/21/2021, 12:32 PM Please see Amion for pager number during day hours 7:00am-4:30pm

## 2021-05-21 NOTE — Progress Notes (Addendum)
1 Day Post-Op Procedure(s) (LRB): ESOPHAGOGASTRODUODENOSCOPY (EGD) WITH PROPOFOL (N/A) COLONOSCOPY WITH PROPOFOL (N/A) BIOPSY POLYPECTOMY Subjective: His biggest complaint is his hemorrhoids  Objective: Vital signs in last 24 hours: Temp:  [97.5 F (36.4 C)-98.7 F (37.1 C)] 98.7 F (37.1 C) (10/19 0541) Pulse Rate:  [92-108] 98 (10/19 0541) Cardiac Rhythm: Normal sinus rhythm (10/19 0541) Resp:  [16-23] 20 (10/19 0541) BP: (96-148)/(58-84) 133/84 (10/19 0541) SpO2:  [93 %-100 %] 94 % (10/19 0723)     Intake/Output from previous day: 10/18 0701 - 10/19 0700 In: 1270.8 [P.O.:500; I.V.:400; IV Piggyback:370.8] Out: 641 [Urine:500; Stool:1; Chest Tube:140] Intake/Output this shift: No intake/output data recorded.  General appearance: alert, cooperative, and no distress Heart: regular rate and rhythm, S1, S2 normal, no murmur, click, rub or gallop Lungs: clear to auscultation bilaterally Abdomen: soft, non-tender; bowel sounds normal; no masses,  no organomegaly Extremities: extremities normal, atraumatic, no cyanosis or edema Wound: clean and dry  Lab Results: Recent Labs    05/18/2021 0231 05/21/21 0138  WBC 35.1* 28.7*  HGB 8.2* 8.0*  HCT 25.7* 26.1*  PLT 521* 570*   BMET:  Recent Labs    05/22/2021 0231 05/21/21 0138  NA 138 138  K 4.0 3.2*  CL 108 112*  CO2 19* 19*  GLUCOSE 142* 111*  BUN 12 18  CREATININE 1.08 1.09  CALCIUM 8.2* 8.2*    PT/INR: No results for input(s): LABPROT, INR in the last 72 hours. ABG No results found for: PHART, HCO3, TCO2, ACIDBASEDEF, O2SAT CBG (last 3)  No results for input(s): GLUCAP in the last 72 hours.  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    Neuro-Pain control is reasonable.   Pericardial drain-output is 150cc in 24 hours. Continue tubes for now. Path on the pericardial fluid and pericardium is pending.  Pulm- tolerating room air with excellent saturation  GI-hemorrhoids-this is his number one complaint. They are very uncomfortable and causing him to not want to get out of the bed. GI is following.   Mr. Tiger is to begin radiation treatment today at [facility] and will return to [facility] following the treatment. We will continue to follow.  Plan: Continue tubes for now.    LOS: 5 days    Elgie Collard 05/21/2021   Will likely remove drains tomorrow  Lajuana Matte

## 2021-05-21 NOTE — Progress Notes (Addendum)
Pt had a rectal pain withl moderate amount of liquid bowel movement, yellowish mixed with red bloody liquid stool this morning. Pt stated he is considering to get surgery if it will help him with bleeding and pain. We will hand off to a morning round team. Pt refused to take Tramadol for pain this morning. Stated he has low appetite and feels bloated and has lots of gas. His family member brought [employer] deep fried chicken and soda drinks for him yesterday and he only ate just a few bites.    We restarted Lactate ringer's 75 ml/hr per order. His vitals remain table. Sinus tachycardia to NSR on monitor, HR 98-108. Will monitor.  Kennyth Lose, RN

## 2021-05-21 NOTE — Progress Notes (Addendum)
Pt is alert and fully oriented. States he want to sleep with out disturb. Pt refused some oral pills and rectal Proctofoam for his hemorrhoid medication tonight. He is hemodynamically stable. HR remains ST on the monitor, afebrile. BP is in normal limits. On room air SPO2 94-96%, no respiratory distress or SOB noted tonight.   His pleural and pericardial chest tube has serosanguinous drainage, no air leak, no subcutaneous emphysema. Dressing is dry and clean. We will continue to monitor.  Kennyth Lose, RN

## 2021-05-21 NOTE — Progress Notes (Incomplete)
Name: Latravis Grine MRN: [id number] DOB: Feb 13, 1971    ADMISSION DATE:  05/18/2021 CONSULTATION DATE:  05/21/2021   REFERRING MD :  Tegeler, EDP   CHIEF COMPLAINT: Hemoptysis, bright red blood per rectum   HISTORY OF PRESENT ILLNESS: 50 year old heavy smoker with COPD, seen by my partner Dr. Loanne Drilling in the pulmonary office and sent to the emergency room.  He reports hemoptysis for the past 6 days, increase shortness of breath and wheezing, unable to ambulate short distances.  He was noted to be bradycardic in the 50s in the office with saturation 80% on room air, placed on 3 L nasal cannula and referred to the emergency room.  CT angiogram chest showed no PE but showed mediastinal mass infiltrating into the carina with mediastinal lymphadenopathy including a 2.7 cm right paratracheal lymph node.  There was stenosis of the right main pulmonary artery there was a 1.7 cm groundglass nodule at the right apex and there was complete collapse of the right lower lobe.  There was a small volume pericardial effusion On further questioning, he reports shortness of breath ongoing for 3 months, 50 pound weight loss over the past 2 years. He also reported bright red blood per rectum, painless and sometimes without his knowledge which is not characteristic of his known hemorrhoids   Labs in the ED showed mild hyponatremia, leukocytosis 30 1K, hemoglobin of 6.9   Significant tests/ events reviewed 10/14 transfused 2 units PRBC 10/15 Bronchoscopy showed large subcarinal mass indenting the carina and bulging and bilaterally with large mainstem lesion obscuring 50% of lumen.  Biopsies taken with cryo and hemostasis achieved >>radiation oncology consult 10/17 pericardial window 10/18 large hemorrhoids, no intervention  10/19 breathing stable, planned for radiation, he is upset about his bleeding hemorrhoids  PAST MEDICAL HISTORY :  Past Medical History:  Diagnosis Date   Anxiety state, unspecified  11/09/2012   Hypertension    SUBJECTIVE:  Upset about ongoing hemorrhoidal bleed VITAL SIGNS: Temp:  [97.5 F (36.4 C)-98.7 F (37.1 C)] 98.7 F (37.1 C) (10/19 0541) Pulse Rate:  [98-108] 107 (10/19 0845) Resp:  [16-20] 20 (10/19 0845) BP: (97-148)/(63-84) 133/79 (10/19 0845) SpO2:  [93 %-99 %] 96 % (10/19 0845)  PHYSICAL EXAMINATION: General: Thin male no acute distress HEENT: MM pink/moist no lymphadenopathy is appreciated CV: Heart sounds are regular PULM: diminished throughout GI: soft, bsx4 active  GU: Voids Extremities: warm/dry,  edema  Skin: no rashes or lesions  Skin:  Warm, no lesions/ rash   Recent Labs  Lab 05/04/2021 0430 05/18/2021 0231 05/21/21 0138  NA 135 138 138  K 3.4* 4.0 3.2*  CL 106 108 112*  CO2 17* 19* 19*  BUN 12 12 18   CREATININE 1.27* 1.08 1.09  GLUCOSE 101* 142* 111*    Recent Labs  Lab 05/06/2021 0430 05/23/2021 0832 05/15/2021 0231 05/21/21 0138  HGB 8.4* 8.0* 8.2* 8.0*  HCT 26.1* 24.3* 25.7* 26.1*  WBC 30.4*  --  35.1* 28.7*  PLT 455*  --  521* 570*    DG CHEST PORT 1 VIEW  Result Date: 05/04/2021 CLINICAL DATA:  Pericardial effusion. EXAM: PORTABLE CHEST 1 VIEW COMPARISON:  May 19, 2021 FINDINGS: The cardiomediastinal silhouette is unchanged and enlarged in contour.Pericardial drain. LEFT-sided chest tube. Small RIGHT pleural effusion, similar in comparison to prior. No significant pneumothorax. Persistent interstitial prominence and haziness of the basilar vasculature most consistent with pulmonary edema and scattered atelectasis. Visualized abdomen is unremarkable. LEFT-sided subcutaneous air. IMPRESSION: Pericardial drain and LEFT-sided chest  tube without significant pneumothorax. Persistent small RIGHT pleural effusion. Electronically Signed   By: Valentino Saxon M.D.   On: 05/28/2021 08:16   DG CHEST PORT 1 VIEW  Result Date: 05/15/2021 CLINICAL DATA:  Pericardial effusion EXAM: PORTABLE CHEST 1 VIEW COMPARISON:  Chest  x-ray dated May 16, 2021 FINDINGS: Cardiac and mediastinal contours are unchanged. Unchanged narrowing of the right mainstem bronchus, likely due to known mediastinal adenopathy. New mediastinal drain and left-sided chest tube. Small right pleural effusion and atelectasis. No pneumothorax. IMPRESSION: Interval placement of mediastinal drain and left-sided chest tube. No evidence of pneumothorax. Small right pleural effusion and atelectasis. Electronically Signed   By: Yetta Glassman M.D.   On: 05/07/2021 15:52    ASSESSMENT / PLAN:  Large subcarinal/mediastinal mass with extension into carina Mediastinal lymphadenopathy and right upper lobe nodule Right lower lobe atelectasis with endobronchial lesion R sided pleural effusion Continue abx Pericardial drain per TCTS Consider diuresis for R pleural effusion, if not improving with diuresis, would consider thoracentesis, no rush give stable respiratory status Awaiting results of biopsy Agree with urgent radiation   Lanier Clam, MD See Shea Evans for contact info [place] Please consult Amion 05/21/2021, 11:44 AM

## 2021-05-21 NOTE — Progress Notes (Signed)
PROGRESS NOTE    Christopher Henry  SKA:[id number] DOB: 12-29-1970 DOA: 05/26/2021 PCP: Bary Castilla, NP   Chief Complaint  Patient presents with   Shortness of Breath    Brief Narrative/Hospital Course:  Christopher Henry, 50 y.o. male with PMH of COPD, HTN, alcohol abuse, tobacco use 1 pack/day x 35 years sent from pulmonary office to ED for acute respiratory distress hypoxic 80% on room air, nonproductive cough and hematemesis over the past 2 months, burning sensation on the chest and greater than 50 pound weight loss x12 months. In the ED, anemic hemoglobin 6.9 g, leukocytosis, D-dimer 3.5, initial troponin 49 lactic acidosis 2.4 creatinine 1.5 chest x-ray RLL pneumonia. CT 18/14 no PE, infiltrative mass of the mediastinum, complete collapse of the right lower lobe with associated complete right lower lobe bronchi occlusion due to endobronchial debris/lesion,?  Mass extension into the carina, left to right midline shift due to decreased lung volume-concern for malignancy likely of the mediastinum with extension into or around the right mainstem bronchi leading to collapse of the right lower lobe, mediastinal lymphadenopathy, 1.7 cm groundglass opacity right apex, small to moderate volume pericardial effusion, subacute to chronic fracture of the medial sternal body. 10/15 underwent bronchoscopy-showed large subcarinal mass indenting the carina and bulging and bilaterally with large mainstem lesion obscuring 50% of lumen, biopsies taken. Large pericardial effusion noted in the echo, cardiology and thoracic surgery following S/p left anterior thoracotomy with drainage of pericardial effusion -open pericardial window by [name] 10/17.  10/18-underwent EGD colonoscopy found to have prolapsed grade 4 rectal hemorrhoids, and patient also started on radiotherapy  Subjective: Seen this morning.  Patient complains of hemorrhoidal pain-hemorrhoids rubbing on bed and causing pain, afraid to  eat.Overnight Moderate amount of liquid bowel movement yellowish mixed with red bloody liquid stool reluctant to go to radiation therapy due to pain. 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 biopsy result pending. Rad Onc has seen  and starting urgent radiotherapy, mapping done-10/18.  Continue pain control.  Malignant large pericardial effusion: S/p open pericardial window by [name] 10/17.  Patient has 2 drains in place,-continue plan per TCTS.  Pericardial drain 150 cc in 24 hours, pericardial fluid and pericardial biopsy pending.  Acute hypoxemic respiratory failure - was hypoxic 80% on room air with respiratory distress on admission 2/2 pneumonia/mediastinal mass. Currently on room air.    Sepsis secondary to pneumonia RLL post obstructive pneumonia: In the setting of malignancy.  Remains intermittently febrile also with leukocytosis-leukocytosis further uptrending.  On ceftriaxone/azithromycin, WBC count bumped up likely reactive.  Now downtrending.  Monitor closely, continue per hematology team, appreciate pulmonary input Recent Labs  Lab 06/01/2021 1604 05/25/2021 1755 05/22/2021 2054 05/07/2021 0743 05/18/21 0221 05/28/2021 0430 05/09/2021 0231 05/21/21 0138  WBC  --   --   --  25.3* 25.5* 30.4* 35.1* 28.7*  LATICACIDVEN 2.4* 2.7*  --   --   --   --   --   --   PROCALCITON  --   --  0.19 0.25 0.25  --   --   --    Hematochezia/melena Acute blood loss anemia in the setting of painless rectal bleeding due to hemorrhoids Anemia  of chronic disease as well S/P multiple PRBC  transfusions.  Stable  now-Transfuse if less than 7 g.  S/p EGD colonoscopy 10/18-mild esophagitis-could be Candida -biopsy pending, large grade 4 prolapsed hemorrhoids-if he has recurrent bleeding may need surgical consultation for hemorrhoids-given significant pain I will request surgery consult. Continue H prep, sitz bath if able to tolerate. Monitor h/h Recent Labs  Lab 05/18/21 1958 05/10/2021 0430 05/15/2021 0832 05/15/2021 0231 05/21/21 0138  HGB 7.9* 8.4* 8.0* 8.2* 8.0*  HCT 23.7* 26.1* 24.3* 25.7* 26.1*    AKI in the setting of dehydration/pneumonia/malignancy. Resolved.  Anion gap metabolic acidosis due to MWU:[id number] Hypokalemia : Repleted again this morning  COPD: Currently not in exacerbation.Continue Dulera,Incruse Ellipta and bronchodilators as needed  Severe protein calorie moderation with significant weight loss and BMI 16.45.  In the setting of malignancy overall poor prognostic indicator.  History of alcohol use/tobacco use patient reports he recently quit.  Continue thiamine folic acid multivitamins and watch for withdrawals.  DVT prophylaxis: SCDs Start: 05/11/2021 2105.  No chemeical prophylaxis due to anemia Code Status:   Code Status: Full Code Family Communication: plan of care discussed with patient his fiance and the mother at bedside. Overall prognosis guarded/poor  Status is: Inpatient Remains inpatient appropriate because: Ongoing management of anemia, pericardial effusion, lung mass pneumonia.  Objective: Vitals: Today's Vitals   05/30/2021 2314 05/21/21 0000 05/21/21 0541 05/21/21 0723  BP: 106/70  133/84   Pulse: (!) 104 (!) 105 98   Resp: 19 20 20    Temp: 02.7 F (36.6 C)  98.7 F (37.1 C)   TempSrc: Oral  Oral   SpO2: 96% 94% 99% 94%  Weight:      PainSc: Asleep Asleep 0-No pain    Physical Examination: General exam: AAOx 3, anxious agitated and in pain from hemorrhoids. HEENT:Oral mucosa moist, Ear/Nose WNL grossly, dentition  normal. Respiratory system: , Diminished breath sound bilaterally at this no use of accessory muscle Cardiovascular system: S1 & S2 +, No JVD, pericardial drain in place. Gastrointestinal system: Abdomen soft, NT,ND, BS+ Nervous System:Alert, awake, moving extremities and grossly nonfocal Extremities: no edema, distal peripheral pulses palpable.  Skin: No rashes,no icterus. MSK: Normal muscle bulk,tone, power   Medications reviewed:  Scheduled Meds:  (feeding supplement) PROSource Plus  30 mL Oral BID BM   colchicine  0.6 mg Oral Daily   feeding supplement  1 Container Oral TID BM   folic acid  1 mg Oral Daily   hydrocortisone-pramoxine  1 applicator Rectal BID   mometasone-formoterol  2 puff Inhalation BID   And   umeclidinium bromide  1 puff Inhalation Daily   multivitamin with minerals  1 tablet Oral Daily   pantoprazole  40 mg Oral BID   psyllium  1 packet Oral Daily   thiamine  100 mg Oral Daily   Or   thiamine  100 mg Intravenous Daily   traMADol  50 mg Oral Q6H   Continuous Infusions:  sodium chloride     cefTRIAXone (ROCEPHIN)  IV 1 g (05/13/2021 2126)   lactated ringers 75 mL/hr at 05/21/21 0514    Diet Order             Diet regular Room service appropriate? Yes; Fluid consistency: Thin  Diet effective now                   Nutrition Problem: Inadequate oral intake Etiology: poor appetite Signs/Symptoms: per patient/family report Interventions: MVI, Boost Breeze, Prostat  Intake/Output  Intake/Output Summary (Last 24 hours) at 05/21/2021 0755 Last data filed at 05/21/2021 0541 Gross per 24 hour  Intake 1270.83 ml  Output 641 ml  Net 629.83 ml    Intake/Output from previous day: 10/18 0701 - 10/19 0700 In: 1270.8 [P.O.:500; I.V.:400; IV Piggyback:370.8] Out: 641 [Urine:500; Stool:1; Chest Tube:140] Net IO Since Admission: 5,335.14 mL [05/21/21 0755]   Weight change:   Wt Readings from Last 3 Encounters:  05/03/2021 61.3 kg  05/05/2021 59.4 kg   04/24/21 60.3 kg    Consultants:see note  Procedures:see note Antimicrobials: Anti-infectives (From admission, onward)    Start     Dose/Rate Route Frequency Ordered Stop   05/06/2021 2145  cefTRIAXone (ROCEPHIN) 1 g in sodium chloride 0.9 % 100 mL IVPB        1 g 200 mL/hr over 30 Minutes Intravenous Every 24 hours 05/05/2021 2053     05/31/2021 2145  azithromycin (ZITHROMAX) 500 mg in sodium chloride 0.9 % 250 mL IVPB        500 mg 250 mL/hr over 60 Minutes Intravenous Every 24 hours 05/23/2021 2053 05/30/2021 2322   05/25/2021 1545  ceFEPIme (MAXIPIME) 2 g in sodium chloride 0.9 % 100 mL IVPB        2 g 200 mL/hr over 30 Minutes Intravenous  Once 05/29/2021 1539 05/03/2021 1640      Culture/Microbiology    Component Value Date/Time   SDES BLOOD LEFT ANTECUBITAL 05/15/2021 1604   SPECREQUEST  05/25/2021 1604    BOTTLES DRAWN AEROBIC AND ANAEROBIC Blood Culture results may not be optimal due to an inadequate volume of blood received in culture bottles   CULT  05/21/2021 1604    NO GROWTH 5 DAYS Performed at [facility], [place] [address] [place], [place] [address]    REPTSTATUS 05/21/2021 FINAL 05/10/2021 1604    Other culture-see note  Unresulted Labs (From admission, onward)     Start     Ordered   06/02/2021 1536  Urinalysis, Routine w reflex microscopic Urine, Clean Catch  (Undifferentiated presentation (screening labs and basic nursing orders))  ONCE - STAT,   STAT        05/15/2021 1538           Data Reviewed: I have personally reviewed following labs and imaging studies CBC: Recent Labs  Lab 05/27/2021 1341 05/15/2021 2050 05/11/2021 0743 05/21/2021 1413 05/18/21 0221 05/18/21 0947 05/18/21 1958 05/14/2021 0430 05/24/2021 0832 05/18/2021 0231 05/21/21 0138  WBC 30.8*  --  25.3*  --  25.5*  --   --  30.4*  --  35.1* 28.7*  NEUTROABS 26.8*  --   --   --   --   --   --   --   --   --   --   HGB 6.9*   < > 7.8*   < > 6.8*   < > 7.9* 8.4* 8.0* 8.2* 8.0*  HCT 21.6*   < > 23.0*    < > 20.9*   < > 23.7* 26.1* 24.3* 25.7* 26.1*  MCV 79.1*  --  77.2*  --  78.3*  --   --  81.1  --  81.1 82.9  PLT 534*  --  393  --  389  --   --  455*  --  521* 570*   < > = values in this interval not displayed.    Basic Metabolic Panel: Recent Labs  Lab 05/03/2021 0743 05/18/21 0221 05/14/2021 0430 05/26/2021 0231 05/21/21  0138  NA 136 134* 135 138 138  K 4.0 4.0 3.4* 4.0 3.2*  CL 105 105 106 108 112*  CO2 19* 20* 17* 19* 19*  GLUCOSE 104* 107* 101* 142* 111*  BUN 27* 20 12 12 18   CREATININE 1.22 1.29* 1.27* 1.08 1.09  CALCIUM 8.4* 8.1* 8.4* 8.2* 8.2*    GFR: Estimated Creatinine Clearance: 70.3 mL/min (by C-G formula based on SCr of 1.09 mg/dL). Liver Function Tests: Recent Labs  Lab 05/03/2021 1604  AST 15  ALT 8  ALKPHOS 61  BILITOT 0.5  PROT 7.6  ALBUMIN 3.3*    Recent Labs  Lab 05/18/2021 1604  LIPASE 29    Recent Labs  Lab 05/13/2021 1604  AMMONIA <10    Coagulation Profile: Recent Labs  Lab 05/21/2021 1604  INR 1.4*    Cardiac Enzymes: No results for input(s): CKTOTAL, CKMB, CKMBINDEX, TROPONINI in the last 168 hours. BNP (last 3 results) No results for input(s): PROBNP in the last 8760 hours. HbA1C: No results for input(s): HGBA1C in the last 72 hours. CBG: No results for input(s): GLUCAP in the last 168 hours. Lipid Profile: No results for input(s): CHOL, HDL, LDLCALC, TRIG, CHOLHDL, LDLDIRECT in the last 72 hours. Thyroid Function Tests: No results for input(s): TSH, T4TOTAL, FREET4, T3FREE, THYROIDAB in the last 72 hours. Anemia Panel: No results for input(s): VITAMINB12, FOLATE, FERRITIN, TIBC, IRON, RETICCTPCT in the last 72 hours.  Sepsis Labs: Recent Labs  Lab 05/28/2021 1604 05/26/2021 1755 05/12/2021 2054 05/18/2021 0743 05/18/21 0221  PROCALCITON  --   --  0.19 0.25 0.25  LATICACIDVEN 2.4* 2.7*  --   --   --      Recent Results (from the past 240 hour(s))  Resp Panel by RT-PCR (Flu A&B, Covid) Nasopharyngeal Swab     Status: None    Collection Time: 05/25/2021  3:38 PM   Specimen: Nasopharyngeal Swab; Nasopharyngeal(NP) swabs in vial transport medium  Result Value Ref Range Status   SARS Coronavirus 2 by RT PCR NEGATIVE NEGATIVE Final    Comment: (NOTE) SARS-CoV-2 target nucleic acids are NOT DETECTED.  The SARS-CoV-2 RNA is generally detectable in upper respiratory specimens during the acute phase of infection. The lowest concentration of SARS-CoV-2 viral copies this assay can detect is 138 copies/mL. A negative result does not preclude SARS-Cov-2 infection and should not be used as the sole basis for treatment or other patient management decisions. A negative result may occur with  improper specimen collection/handling, submission of specimen other than nasopharyngeal swab, presence of viral mutation(s) within the areas targeted by this assay, and inadequate number of viral copies(<138 copies/mL). A negative result must be combined with clinical observations, patient history, and epidemiological information. The expected result is Negative.  Fact Sheet for Patients:  EntrepreneurPulse.com.au  Fact Sheet for Healthcare Providers:  IncredibleEmployment.be  This test is no t yet approved or cleared by the [place] FDA and  has been authorized for detection and/or diagnosis of SARS-CoV-2 by FDA under an Emergency Use Authorization (EUA). This EUA will remain  in effect (meaning this test can be used) for the duration of the COVID-19 declaration under Section 564(b)(1) of the Act, 21 U.S.C.section 360bbb-3(b)(1), unless the authorization is terminated  or revoked sooner.       Influenza A by PCR NEGATIVE NEGATIVE Final   Influenza B by PCR NEGATIVE NEGATIVE Final    Comment: (NOTE) The Xpert Xpress SARS-CoV-2/FLU/RSV plus assay is intended as an aid in the diagnosis of  influenza from Nasopharyngeal swab specimens and should not be used as a sole basis for treatment.  Nasal washings and aspirates are unacceptable for Xpert Xpress SARS-CoV-2/FLU/RSV testing.  Fact Sheet for Patients: EntrepreneurPulse.com.au  Fact Sheet for Healthcare Providers: IncredibleEmployment.be  This test is not yet approved or cleared by the [place] FDA and has been authorized for detection and/or diagnosis of SARS-CoV-2 by FDA under an Emergency Use Authorization (EUA). This EUA will remain in effect (meaning this test can be used) for the duration of the COVID-19 declaration under Section 564(b)(1) of the Act, 21 U.S.C. section 360bbb-3(b)(1), unless the authorization is terminated or revoked.  Performed at [facility], [place] [address] [place], [place] [address]   Blood culture (routine single)     Status: None   Collection Time: 05/18/2021  4:04 PM   Specimen: BLOOD  Result Value Ref Range Status   Specimen Description BLOOD LEFT ANTECUBITAL  Final   Special Requests   Final    BOTTLES DRAWN AEROBIC AND ANAEROBIC Blood Culture results may not be optimal due to an inadequate volume of blood received in culture bottles   Culture   Final    NO GROWTH 5 DAYS Performed at [facility], [place] [address] [place],  [address]    Report Status 05/21/2021 FINAL  Final      Radiology Studies: DG CHEST PORT 1 VIEW  Result Date: 05/18/2021 CLINICAL DATA:  Pericardial effusion. EXAM: PORTABLE CHEST 1 VIEW COMPARISON:  May 19, 2021 FINDINGS: The cardiomediastinal silhouette is unchanged and enlarged in contour.Pericardial drain. LEFT-sided chest tube. Small RIGHT pleural effusion, similar in comparison to prior. No significant pneumothorax. Persistent interstitial prominence and haziness of the basilar vasculature most consistent with pulmonary edema and scattered atelectasis. Visualized abdomen is unremarkable. LEFT-sided subcutaneous air. IMPRESSION: Pericardial drain and LEFT-sided chest tube without significant  pneumothorax. Persistent small RIGHT pleural effusion. Electronically Signed   By: Valentino Saxon M.D.   On: 05/08/2021 08:16   DG CHEST PORT 1 VIEW  Result Date: 05/11/2021 CLINICAL DATA:  Pericardial effusion EXAM: PORTABLE CHEST 1 VIEW COMPARISON:  Chest x-ray dated May 16, 2021 FINDINGS: Cardiac and mediastinal contours are unchanged. Unchanged narrowing of the right mainstem bronchus, likely due to known mediastinal adenopathy. New mediastinal drain and left-sided chest tube. Small right pleural effusion and atelectasis. No pneumothorax. IMPRESSION: Interval placement of mediastinal drain and left-sided chest tube. No evidence of pneumothorax. Small right pleural effusion and atelectasis. Electronically Signed   By: Yetta Glassman M.D.   On: 06/01/2021 15:52     LOS: 5 days   Antonieta Pert, MD [facility]  05/21/2021, 7:55 AM

## 2021-05-21 NOTE — Progress Notes (Signed)
Pt came back to rm 24 from [place]. Reinitiated tele. Call bell within reach. Vss.  Lavenia Atlas, RN

## 2021-05-21 NOTE — Progress Notes (Signed)
I called LM for the patient's fiancee and asked her to call our office back to share pathology information. His biopsy is malignant, IHC is pending. I tried calling the patient in his room but could not reach him.

## 2021-05-22 ENCOUNTER — Ambulatory Visit
Admit: 2021-05-22 | Discharge: 2021-05-22 | Disposition: A | Discharge status: Home / Self Care | Payer: 59 | Attending: Radiation Oncology | Admitting: Radiation Oncology

## 2021-05-22 ENCOUNTER — Inpatient Hospital Stay (HOSPITAL_COMMUNITY): Payer: 59

## 2021-05-22 DIAGNOSIS — J90 Pleural effusion, not elsewhere classified: Secondary | ICD-10-CM | POA: Diagnosis not present

## 2021-05-22 DIAGNOSIS — C3490 Malignant neoplasm of unspecified part of unspecified bronchus or lung: Secondary | ICD-10-CM | POA: Diagnosis not present

## 2021-05-22 DIAGNOSIS — I3139 Other pericardial effusion (noninflammatory): Secondary | ICD-10-CM

## 2021-05-22 DIAGNOSIS — R918 Other nonspecific abnormal finding of lung field: Secondary | ICD-10-CM | POA: Diagnosis not present

## 2021-05-22 DIAGNOSIS — E43 Unspecified severe protein-calorie malnutrition: Secondary | ICD-10-CM | POA: Insufficient documentation

## 2021-05-22 LAB — CBC
HCT: 24.5 % — ABNORMAL LOW (ref 39.0–52.0)
Hemoglobin: 7.6 g/dL — ABNORMAL LOW (ref 13.0–17.0)
MCH: 25.2 pg — ABNORMAL LOW (ref 26.0–34.0)
MCHC: 31 g/dL (ref 30.0–36.0)
MCV: 81.1 fL (ref 80.0–100.0)
Platelets: 579 10*3/uL — ABNORMAL HIGH (ref 150–400)
RBC: 3.02 MIL/uL — ABNORMAL LOW (ref 4.22–5.81)
RDW: 18.7 % — ABNORMAL HIGH (ref 11.5–15.5)
WBC: 22 10*3/uL — ABNORMAL HIGH (ref 4.0–10.5)
nRBC: 0.1 % (ref 0.0–0.2)

## 2021-05-22 LAB — CYTOLOGY - NON PAP

## 2021-05-22 LAB — BASIC METABOLIC PANEL
Anion gap: 8 (ref 5–15)
BUN: 12 mg/dL (ref 6–20)
CO2: 20 mmol/L — ABNORMAL LOW (ref 22–32)
Calcium: 8.4 mg/dL — ABNORMAL LOW (ref 8.9–10.3)
Chloride: 109 mmol/L (ref 98–111)
Creatinine, Ser: 0.97 mg/dL (ref 0.61–1.24)
GFR, Estimated: >60 mL/min (ref >60)
Glucose, Bld: 95 mg/dL (ref 70–99)
Potassium: 3.4 mmol/L — ABNORMAL LOW (ref 3.5–5.1)
Sodium: 137 mmol/L (ref 135–145)

## 2021-05-22 LAB — SURGICAL PATHOLOGY

## 2021-05-22 MED ORDER — ENSURE ENLIVE PO LIQD
237.0000 mL | Freq: Four times a day (QID) | ORAL | Status: DC
  Administered 2021-05-22 – 2021-05-27 (×13): 237 mL via ORAL
  Administered 2021-05-27: 1 via ORAL
  Administered 2021-05-28 – 2021-06-05 (×24): 237 mL via ORAL

## 2021-05-22 MED ORDER — HYDROCORTISONE ACETATE 25 MG RE SUPP
25.0000 mg | Freq: Two times a day (BID) | RECTAL | Status: DC
  Administered 2021-05-22 – 2021-05-24 (×4): 25 mg via RECTAL
  Filled 2021-05-22 (×33): qty 1

## 2021-05-22 MED ORDER — DRONABINOL 2.5 MG PO CAPS
2.5000 mg | ORAL_CAPSULE | Freq: Two times a day (BID) | ORAL | Status: DC
  Administered 2021-05-22 – 2021-06-05 (×28): 2.5 mg via ORAL
  Filled 2021-05-22 (×30): qty 1

## 2021-05-22 MED ORDER — POTASSIUM CHLORIDE CRYS ER 20 MEQ PO TBCR
40.0000 meq | EXTENDED_RELEASE_TABLET | Freq: Once | ORAL | Status: AC
  Administered 2021-05-22: 40 meq via ORAL
  Filled 2021-05-22: qty 2

## 2021-05-22 NOTE — Progress Notes (Signed)
Pt chest tube removed, VSS, dressing applied, pt tolerated well.   Chrisandra Carota, RN 05/22/2021 12:56 PM

## 2021-05-22 NOTE — Progress Notes (Signed)
Mobility Specialist Progress Note:   05/22/21 1131  Mobility  Activity Refused mobility   Refused d/t "Hemorid pain".    [facility] Health and safety inspector Phone [phone]

## 2021-05-22 NOTE — Progress Notes (Signed)
PROGRESS NOTE    Christopher Henry  YBO:[id number] DOB: 1971/03/23 DOA: 05/03/2021 PCP: Bary Castilla, NP   Chief Complaint  Patient presents with   Shortness of Breath    Brief Narrative/Hospital Course:  Christopher Henry, 50 y.o. male with PMH of COPD, HTN, alcohol abuse, tobacco use 1 pack/day x 35 years sent from pulmonary office to ED for acute respiratory distress hypoxic 80% on room air, nonproductive cough and hematemesis over the past 2 months, burning sensation on the chest and greater than 50 pound weight loss x12 months. In the ED, anemic hemoglobin 6.9 g, leukocytosis, D-dimer 3.5, initial troponin 49 lactic acidosis 2.4 creatinine 1.5 chest x-ray RLL pneumonia. CT 18/14 no PE, infiltrative mass of the mediastinum, complete collapse of the right lower lobe with associated complete right lower lobe bronchi occlusion due to endobronchial debris/lesion,?  Mass extension into the carina, left to right midline shift due to decreased lung volume-concern for malignancy likely of the mediastinum with extension into or around the right mainstem bronchi leading to collapse of the right lower lobe, mediastinal lymphadenopathy, 1.7 cm groundglass opacity right apex, small to moderate volume pericardial effusion, subacute to chronic fracture of the medial sternal body. 10/15 underwent bronchoscopy-showed large subcarinal mass indenting the carina and bulging and bilaterally with large mainstem lesion obscuring 50% of lumen, biopsies taken. Large pericardial effusion noted in the echo, cardiology and thoracic surgery following S/p left anterior thoracotomy with drainage of pericardial effusion -open pericardial window by [name] 10/17.  10/18-underwent EGD colonoscopy found to have prolapsed grade 4 rectal hemorrhoids, and patient also started on radiotherapy  Subjective: Seen this morning no new complaints discomfort due to his hemorrhoids.  He had radiation therapy yesterday and is going  again today. Overnight afebrile, pericardial drain 90 cc past 24 hours K 3.4, wbc at 22k,   Assessment & Plan:  Large subcarinal/mediastinal mass extending into the carina Mediastinal lymphadenopathy with right upper lobe nodule Right lower lobe atelectasis with endobronchial lesion: Suspecting locally advanced NSCLC of the right hilum- Underwent bronchoscopy 10/15- mass potential to cause central airway obstruction. biopsy result pending. Rad Onc has seen  and radiotherapy started 10/19. urgently.Continue pain control.  Malignant large pericardial effusion: S/p open pericardial window by [name] 10/17.  Continues pericardial drain monitor output, managed by the CTS.  Pericardial fluid and biopsy results pending  Acute hypoxemic respiratory failure - was hypoxic 80% on room air with respiratory distress on admission 2/2 pneumonia/mediastinal mass. Currently on room air.    Sepsis secondary to pneumonia RLL post obstructive pneumonia: In the setting of malignancy.  Remains intermittently febrile also with leukocytosis-leukocytosis further uptrending.  On ceftriaxone/azithromycin, WBC count bumped up likely reactive.  Improving, afebrile, cont his antibiotics, IS, XRT. Recent Labs  Lab 06/02/2021 1604 05/14/2021 1755 05/26/2021 2054 05/27/2021 0743 05/18/21 0221 05/30/2021 0430 05/24/2021 0231 05/21/21 0138 05/22/21 0207  WBC  --   --   --  25.3* 25.5* 30.4* 35.1* 28.7* 22.0*  LATICACIDVEN 2.4* 2.7*  --   --   --   --   --   --   --   PROCALCITON  --   --  0.19 0.25 0.25  --   --   --   --    Hematochezia/melena Acute blood loss anemia in the setting of painless rectal bleeding due to hemorrhoids Anemia of chronic disease as well S/P multiple PRBC transfusions.  Stable  now-Transfuse if less than 7 g.  S/p EGD colonoscopy 10/18-mild esophagitis-could be  Candida -biopsy pending, large grade 4 prolapsed hemorrhoids-if he has recurrent bleeding may need surgical consultation for  hemorrhoids-given significant pain seen by general surgery advised medical and topical care no plan for surgery at this time- w/ H prep, sitz bath. Monitor h/h-May need transfusion if further downtrending Recent Labs  Lab 05/31/2021 0430 05/11/2021 0832 05/11/2021 0231 05/21/21 0138 05/22/21 0207  HGB 8.4* 8.0* 8.2* 8.0* 7.6*  HCT 26.1* 24.3* 25.7* 26.1* 24.5*    AKI in the setting of dehydration/pneumonia/malignancy.  Improved.  Anion gap metabolic acidosis due to MMH:[id number] Hypokalemia : Replete po again  COPD: Currently not in exacerbation.Continue Dulera,Incruse Ellipta and bronchodilators as needed  Severe protein calorie moderation with significant weight loss and BMI 16.45.  In the setting of malignancy overall poor prognostic indicator.  History of alcohol use/tobacco use patient reports he recently quit.  Continue thiamine folic acid multivitamins and watch for withdrawals.  Severe malnutrition augment diet as below added Marinol. Nutrition Problem: Severe Malnutrition Etiology: acute illness (newly diagnosed lung cancer) Signs/Symptoms: severe muscle depletion, severe fat depletion, percent weight loss (6% weight loss in one month) Percent weight loss: 6 % Interventions: MVI, Magic cup, Ensure Enlive (each supplement provides 350kcal and 20 grams of protein)    DVT prophylaxis: SCDs Start: 05/08/2021 2105.  No chemeical prophylaxis due to anemia Code Status:   Code Status: Full Code Family Communication: plan of care discussed with patient his fiance and the mother at bedside. Overall prognosis guarded/poor  Status is: Inpatient Remains inpatient appropriate because: Ongoing management of anemia, pericardial effusion, lung mass pneumonia.  Objective: Vitals: Today's Vitals   05/21/21 2336 05/22/21 0326 05/22/21 0643 05/22/21 0731  BP: 129/83 123/62    Pulse: 92 90    Resp: 20 18    Temp: 98 F (36.7 C) 98.5 F (36.9 C)    TempSrc: Oral Oral    SpO2: 98% 98%  98%   Weight:      PainSc:   0-No pain    Physical Examination: General exam: AAOx 3 older than stated age, weak appearing. HEENT:Oral mucosa moist, Ear/Nose WNL grossly, dentition normal. Respiratory system: bilaterally diminished, deep/pericardial drain in place no use of accessory muscle Cardiovascular system: S1 & S2 +, No JVD,. Gastrointestinal system: Abdomen soft,NT,ND, BS+ Nervous System:Alert, awake, moving extremities and grossly nonfocal Extremities: no edema, distal peripheral pulses palpable.  Skin: No rashes,no icterus. MSK: Normal muscle bulk,tone, power   Medications reviewed:  Scheduled Meds:  (feeding supplement) PROSource Plus  30 mL Oral BID BM   colchicine  0.6 mg Oral Daily   feeding supplement  1 Container Oral TID BM   folic acid  1 mg Oral Daily   hydrocortisone-pramoxine  1 applicator Rectal BID   mometasone-formoterol  2 puff Inhalation BID   And   umeclidinium bromide  1 puff Inhalation Daily   multivitamin with minerals  1 tablet Oral Daily   pantoprazole  40 mg Oral BID   potassium chloride  20 mEq Oral Daily   psyllium  1 packet Oral Daily   thiamine  100 mg Oral Daily   Or   thiamine  100 mg Intravenous Daily   traMADol  50 mg Oral Q6H   Continuous Infusions:  sodium chloride     cefTRIAXone (ROCEPHIN)  IV 1 g (05/21/21 2203)    Diet Order             Diet regular Room service appropriate? Yes; Fluid consistency: Thin  Diet effective now  Nutrition Problem: Inadequate oral intake Etiology: poor appetite Signs/Symptoms: per patient/family report Interventions: MVI, Boost Breeze, Prostat  Intake/Output  Intake/Output Summary (Last 24 hours) at 05/22/2021 0800 Last data filed at 05/22/2021 0650 Gross per 24 hour  Intake --  Output 110 ml  Net -110 ml    Intake/Output from previous day: 10/19 0701 - 10/20 0700 In: -  Out: 110 [Chest Tube:110] Net IO Since Admission: 5,225.14 mL [05/22/21 0800]   Weight  change:   Wt Readings from Last 3 Encounters:  05/22/2021 61.3 kg  05/07/2021 59.4 kg  04/24/21 60.3 kg    Consultants:see note  Procedures:see note Antimicrobials: Anti-infectives (From admission, onward)    Start     Dose/Rate Route Frequency Ordered Stop   05/27/2021 2145  cefTRIAXone (ROCEPHIN) 1 g in sodium chloride 0.9 % 100 mL IVPB        1 g 200 mL/hr over 30 Minutes Intravenous Every 24 hours 05/12/2021 2053     05/18/2021 2145  azithromycin (ZITHROMAX) 500 mg in sodium chloride 0.9 % 250 mL IVPB        500 mg 250 mL/hr over 60 Minutes Intravenous Every 24 hours 05/09/2021 2053 05/21/2021 2322   05/25/2021 1545  ceFEPIme (MAXIPIME) 2 g in sodium chloride 0.9 % 100 mL IVPB        2 g 200 mL/hr over 30 Minutes Intravenous  Once 05/04/2021 1539 05/18/2021 1640      Culture/Microbiology    Component Value Date/Time   SDES BLOOD LEFT ANTECUBITAL 05/11/2021 1604   SPECREQUEST  05/13/2021 1604    BOTTLES DRAWN AEROBIC AND ANAEROBIC Blood Culture results may not be optimal due to an inadequate volume of blood received in culture bottles   CULT  05/03/2021 1604    NO GROWTH 5 DAYS Performed at [facility], [place] [address] [place], [place] [address]    REPTSTATUS 05/21/2021 FINAL 06/02/2021 1604    Other culture-see note  Unresulted Labs (From admission, onward)     Start     Ordered   05/22/21 0947  Basic metabolic panel  Daily,   R     Question:  Specimen collection method  Answer:  Lab=Lab collect   05/21/21 0803   05/22/21 0500  CBC  Daily,   R     Question:  Specimen collection method  Answer:  Lab=Lab collect   05/21/21 0803   05/15/2021 1536  Urinalysis, Routine w reflex microscopic Urine, Clean Catch  (Undifferentiated presentation (screening labs and basic nursing orders))  ONCE - STAT,   STAT        05/30/2021 1538           Data Reviewed: I have personally reviewed following labs and imaging studies CBC: Recent Labs  Lab 05/25/2021 1341 05/29/2021 2050 05/18/21 0221  05/18/21 0947 05/15/2021 0430 05/23/2021 0832 05/27/2021 0231 05/21/21 0138 05/22/21 0207  WBC 30.8*   < > 25.5*  --  30.4*  --  35.1* 28.7* 22.0*  NEUTROABS 26.8*  --   --   --   --   --   --   --   --   HGB 6.9*   < > 6.8*   < > 8.4* 8.0* 8.2* 8.0* 7.6*  HCT 21.6*   < > 20.9*   < > 26.1* 24.3* 25.7* 26.1* 24.5*  MCV 79.1*   < > 78.3*  --  81.1  --  81.1 82.9 81.1  PLT 534*   < > 389  --  455*  --  521* 570* 579*   < > = values in this interval not displayed.    Basic Metabolic Panel: Recent Labs  Lab 05/18/21 0221 05/11/2021 0430 06/01/2021 0231 05/21/21 0138 05/22/21 0207  NA 134* 135 138 138 137  K 4.0 3.4* 4.0 3.2* 3.4*  CL 105 106 108 112* 109  CO2 20* 17* 19* 19* 20*  GLUCOSE 107* 101* 142* 111* 95  BUN 20 12 12 18 12   CREATININE 1.29* 1.27* 1.08 1.09 0.97  CALCIUM 8.1* 8.4* 8.2* 8.2* 8.4*    GFR: Estimated Creatinine Clearance: 79 mL/min (by C-G formula based on SCr of 0.97 mg/dL). Liver Function Tests: Recent Labs  Lab 05/28/2021 1604  AST 15  ALT 8  ALKPHOS 61  BILITOT 0.5  PROT 7.6  ALBUMIN 3.3*    Recent Labs  Lab 05/14/2021 1604  LIPASE 29    Recent Labs  Lab 05/08/2021 1604  AMMONIA <10    Coagulation Profile: Recent Labs  Lab 05/21/2021 1604  INR 1.4*    Cardiac Enzymes: No results for input(s): CKTOTAL, CKMB, CKMBINDEX, TROPONINI in the last 168 hours. BNP (last 3 results) No results for input(s): PROBNP in the last 8760 hours. HbA1C: No results for input(s): HGBA1C in the last 72 hours. CBG: No results for input(s): GLUCAP in the last 168 hours. Lipid Profile: No results for input(s): CHOL, HDL, LDLCALC, TRIG, CHOLHDL, LDLDIRECT in the last 72 hours. Thyroid Function Tests: No results for input(s): TSH, T4TOTAL, FREET4, T3FREE, THYROIDAB in the last 72 hours. Anemia Panel: No results for input(s): VITAMINB12, FOLATE, FERRITIN, TIBC, IRON, RETICCTPCT in the last 72 hours.  Sepsis Labs: Recent Labs  Lab 05/15/2021 1604 05/27/2021 1755  05/03/2021 2054 05/08/2021 0743 05/18/21 0221  PROCALCITON  --   --  0.19 0.25 0.25  LATICACIDVEN 2.4* 2.7*  --   --   --      Recent Results (from the past 240 hour(s))  Resp Panel by RT-PCR (Flu A&B, Covid) Nasopharyngeal Swab     Status: None   Collection Time: 05/04/2021  3:38 PM   Specimen: Nasopharyngeal Swab; Nasopharyngeal(NP) swabs in vial transport medium  Result Value Ref Range Status   SARS Coronavirus 2 by RT PCR NEGATIVE NEGATIVE Final    Comment: (NOTE) SARS-CoV-2 target nucleic acids are NOT DETECTED.  The SARS-CoV-2 RNA is generally detectable in upper respiratory specimens during the acute phase of infection. The lowest concentration of SARS-CoV-2 viral copies this assay can detect is 138 copies/mL. A negative result does not preclude SARS-Cov-2 infection and should not be used as the sole basis for treatment or other patient management decisions. A negative result may occur with  improper specimen collection/handling, submission of specimen other than nasopharyngeal swab, presence of viral mutation(s) within the areas targeted by this assay, and inadequate number of viral copies(<138 copies/mL). A negative result must be combined with clinical observations, patient history, and epidemiological information. The expected result is Negative.  Fact Sheet for Patients:  EntrepreneurPulse.com.au  Fact Sheet for Healthcare Providers:  IncredibleEmployment.be  This test is no t yet approved or cleared by the [place] FDA and  has been authorized for detection and/or diagnosis of SARS-CoV-2 by FDA under an Emergency Use Authorization (EUA). This EUA will remain  in effect (meaning this test can be used) for the duration of the COVID-19 declaration under Section 564(b)(1) of the Act, 21 U.S.C.section 360bbb-3(b)(1), unless the authorization is terminated  or revoked sooner.  Influenza A by PCR NEGATIVE NEGATIVE Final    Influenza B by PCR NEGATIVE NEGATIVE Final    Comment: (NOTE) The Xpert Xpress SARS-CoV-2/FLU/RSV plus assay is intended as an aid in the diagnosis of influenza from Nasopharyngeal swab specimens and should not be used as a sole basis for treatment. Nasal washings and aspirates are unacceptable for Xpert Xpress SARS-CoV-2/FLU/RSV testing.  Fact Sheet for Patients: EntrepreneurPulse.com.au  Fact Sheet for Healthcare Providers: IncredibleEmployment.be  This test is not yet approved or cleared by the [place] FDA and has been authorized for detection and/or diagnosis of SARS-CoV-2 by FDA under an Emergency Use Authorization (EUA). This EUA will remain in effect (meaning this test can be used) for the duration of the COVID-19 declaration under Section 564(b)(1) of the Act, 21 U.S.C. section 360bbb-3(b)(1), unless the authorization is terminated or revoked.  Performed at [facility], [place] [address], [place], [place] [address]   Blood culture (routine single)     Status: None   Collection Time: 05/14/2021  4:04 PM   Specimen: BLOOD  Result Value Ref Range Status   Specimen Description BLOOD LEFT ANTECUBITAL  Final   Special Requests   Final    BOTTLES DRAWN AEROBIC AND ANAEROBIC Blood Culture results may not be optimal due to an inadequate volume of blood received in culture bottles   Culture   Final    NO GROWTH 5 DAYS Performed at [facility], [place] [address] [place],  [address]    Report Status 05/21/2021 FINAL  Final      Radiology Studies: No results found.   LOS: 6 days   Antonieta Pert, MD [facility]  05/22/2021, 8:00 AM

## 2021-05-22 NOTE — Plan of Care (Signed)
  Problem: Clinical Measurements: Goal: Will remain free from infection Outcome: Progressing Goal: Diagnostic test results will improve Outcome: Progressing Goal: Cardiovascular complication will be avoided Outcome: Progressing   Problem: Activity: Goal: Risk for activity intolerance will decrease Outcome: Progressing

## 2021-05-22 NOTE — Progress Notes (Addendum)
Name: Christopher Henry MRN: [id number] DOB: 1971-03-16    ADMISSION DATE:  05/27/2021 CONSULTATION DATE:  05/22/2021   REFERRING MD :  Christopher Henry, EDP   CHIEF COMPLAINT: Hemoptysis, bright red blood per rectum   HISTORY OF PRESENT ILLNESS: 50 year old heavy smoker with COPD, seen by my partner Dr. Loanne Henry in the pulmonary office and sent to the emergency room.  He reports hemoptysis for the past 6 days, increase shortness of breath and wheezing, unable to ambulate short distances.  He was noted to be bradycardic in the 50s in the office with saturation 80% on room air, placed on 3 L nasal cannula and referred to the emergency room.  CT angiogram chest showed no PE but showed mediastinal mass infiltrating into the carina with mediastinal lymphadenopathy including a 2.7 cm right paratracheal lymph node.  There was stenosis of the right main pulmonary artery there was a 1.7 cm groundglass nodule at the right apex and there was complete collapse of the right lower lobe.  There was a small volume pericardial effusion On further questioning, he reports shortness of breath ongoing for 3 months, 50 pound weight loss over the past 2 years. He also reported bright red blood per rectum, painless and sometimes without his knowledge which is not characteristic of his known hemorrhoids   Labs in the ED showed mild hyponatremia, leukocytosis 30 1K, hemoglobin of 6.9   Significant tests/ events reviewed 10/14 transfused 2 units PRBC 10/15 Bronchoscopy showed large subcarinal mass indenting the carina and bulging and bilaterally with large mainstem lesion obscuring 50% of lumen.  Biopsies taken with cryo and hemostasis achieved >>radiation oncology consult  PAST MEDICAL HISTORY :  Past Medical History:  Diagnosis Date   Anxiety state, unspecified 11/09/2012   Hypertension    SUBJECTIVE:  No complaints chest tube is draining VITAL SIGNS: Temp:  [98 F (36.7 C)-98.7 F (37.1 C)] 98.6 F (37 C) (10/20  0807) Pulse Rate:  [90-109] 90 (10/20 0807) Resp:  [18-20] 18 (10/20 0807) BP: (119-153)/(62-92) 139/79 (10/20 0807) SpO2:  [95 %-98 %] 96 % (10/20 0807)  PHYSICAL EXAMINATION: General: Thin male no acute distress HEENT: MM pink/moist no ymphadenopathy is appreciated Neuro: Dull effect but grossly intact CV: Heart sounds are regular PULM: Throughout chest to 90 cc with drainage GI: soft, bsx4 active  GU: Voids Extremities: warm/dry,  edema  Skin: no rashes or lesions     Recent Labs  Lab 06/01/2021 0231 05/21/21 0138 05/22/21 0207  NA 138 138 137  K 4.0 3.2* 3.4*  CL 108 112* 109  CO2 19* 19* 20*  BUN 12 18 12   CREATININE 1.08 1.09 0.97  GLUCOSE 142* 111* 95   Recent Labs  Lab 06/01/2021 0231 05/21/21 0138 05/22/21 0207  HGB 8.2* 8.0* 7.6*  HCT 25.7* 26.1* 24.5*  WBC 35.1* 28.7* 22.0*  PLT 521* 570* 579*   No results found.  ASSESSMENT / PLAN:  Large subcarinal/mediastinal mass with extension into carina Mediastinal lymphadenopathy and right upper lobe nodule  Right lower lobe atelectasis with endobronchial lesion   Traveling daily to [facility] for radiation Once he is cleared by GI cardiology CVTS he may transfer to [facility] for continued radiation treatment Will need PET scan as outpatient    Large pericardial effusion confirmed on echo with RA collapse Day 3 of left anterior thoracotomy for drainage of pleural recurrent effusion with 90 cc out for the last 24 hours per CVTS  Postobstructive/right lower lobe pneumonia Antimicrobial therapy  Acute/chronic  hypoxic respiratory failure -oxygen as needed Bronchodilators  Hematochezia/iron deficiency anemia- Per GI   Christopher Henry [facility] Acute Care Nurse Practitioner [place] Please consult Amion 05/22/2021, 11:07 AM   Attending attestation:  Mr. Christopher Henry is a 49 year old gentleman with newly diagnosed non-small cell carcinoma invading into the right mainstem bronchus and  mediastinum causing central airway compression and malignant pericardial effusion.  Pericardial window performed earlier this week.  Chest tube and pericardial drain removed today.  His main complaint is ongoing discomfort from hemorrhoids.  BP (!) 118/96 (BP Location: Left Arm)   Pulse 100   Temp 97.7 F (36.5 C) (Oral)   Resp 19   Wt 61.3 kg   SpO2 100%   BMI 16.45 kg/m  Chronically ill appearing man lying in bed in NAD [place]/AT, eyes anicteric S1S2, RRR Breathing comfortably on RA, faint basilar rhales on the R, improved breath sounds on the left Abd nondistended, soft No LE edema Skin warm, dry, no rashes Awake, alert, answering questions appropriately.  Mild ptosis on the right.  CXR personally reviewed-shrinking right dependent layering effusion.  Chest tubes and pericardial drain removed.  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  Full staging scans have not yet been completed. -Palliative radiation-appreciate radiation oncology's management - Will eventually need full staging scans and treatment.  Layering right pleural effusion-diminishing.  Suspect this is transudative - Continue diuresis  Concern for COPD - Continue triple inhaled therapy-Dulera and Incruse.  Recommend discharge on these medications. - Albuterol as needed Can follow-up with outpatient pulmonary.  Please let us know when he is close to discharge and we will help facilitate follow-up in the office.  PCCM will sign off.  Please reconsult with questions.  We will help facilitate outpatient follow-up with pulmonary when he is closer to discharge.  Christopher Hy, DO 05/22/21 6:46 PM [facility]

## 2021-05-22 NOTE — Progress Notes (Signed)
      [address]       [place] [address]             [phone]        CTs removed, follow up CXR is free from pneumothorax.  We will sign off at this time, please re-consult if we can be of further assistance.  Ellwood Handler, PA-C

## 2021-05-22 NOTE — Progress Notes (Addendum)
Nutrition Follow-up  DOCUMENTATION CODES:   Severe malnutrition in context of acute illness/injury, Underweight  INTERVENTION:   Change supplement to Ensure Enlive/Plus po QID for more calories and protein, each supplement provides 350 kcal and 13-20 grams of protein. D/C Boost Breeze. Add Magic cup TID with meals, each supplement provides 290 kcal and 9 grams of protein. Continue Prosource Plus 30 ml PO BID, each packet provides 100 kcal and 15 gm protein Continue MVI with minerals daily.  NUTRITION DIAGNOSIS:   Severe Malnutrition related to acute illness (newly diagnosed lung cancer) as evidenced by severe muscle depletion, severe fat depletion, percent weight loss (6% weight loss in one month).  Ongoing   GOAL:   Patient will meet greater than or equal to 90% of their needs  Progressing   MONITOR:   PO intake, Supplement acceptance, Labs  REASON FOR ASSESSMENT:   Consult Poor PO  ASSESSMENT:   Pt with PMH significant for EtOH abuse, tobacco use, HTN, and anxiety admitted with hematochezia, occasional melena, anemia, weight loss, and mediastinal mass concerning for malignancy (biopsied via bronchoscopy, results pending).  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 He has been started on radiotherapy. S/P EGD colonoscopy 10/18 which revealed prolapsed grade 4 rectal hemorrhoids.  Patient c/o "no appetite." He is drinking Boost Breeze supplements TID and taking ProSource Plus supplement BID, but not eating anything on his meal trays. He likes Ensure, will change supplement to Ensure Enlive/Plus for more calories and protein than Boost Breeze. Patient states that having a bowel movement is very uncomfortable d/t hemorrhoids. He asked about receiving an appetite stimulant, relayed this to his primary physician, and he ordered it.   Patient reports 10 lb weight loss PTA. Weight history reviewed. Patient with 6% weight loss within the past month, which is severe.   Labs  reviewed. K 3.4  Medications reviewed and include folic acid, MVI with minerals, Protonix, potassium chloride, Metamucil, thiamine, IV Rocephin.  Patient meets criteria for severe malnutrition with severe depletion of muscle and subcutaneous fat mass with 6% weight loss within one month.   NUTRITION - FOCUSED PHYSICAL EXAM:  Flowsheet Row Most Recent Value  Orbital Region Moderate depletion  Upper Arm Region Severe depletion  Thoracic and Lumbar Region Severe depletion  Buccal Region Moderate depletion  Temple Region Moderate depletion  Clavicle Bone Region Severe depletion  Clavicle and Acromion Bone Region Severe depletion  Scapular Bone Region Severe depletion  Dorsal Hand Moderate depletion  Patellar Region Moderate depletion  Anterior Thigh Region Moderate depletion  Posterior Calf Region Moderate depletion  Edema (RD Assessment) None  Hair Reviewed  Eyes Reviewed  Mouth Reviewed  Skin Reviewed  Nails Reviewed       Diet Order:   Diet Order             Diet regular Room service appropriate? Yes; Fluid consistency: Thin  Diet effective now                   EDUCATION NEEDS:   Education needs have been addressed  Skin:  Skin Assessment: Reviewed RN Assessment  Last BM:  10/16  Height:   Ht Readings from Last 1 Encounters:  05/29/2021 6\' 4"  (1.93 m)    Weight:   Wt Readings from Last 1 Encounters:  05/22/2021 61.3 kg    BMI:  Body mass index is 16.45 kg/m.  Estimated Nutritional Needs:   Kcal:  2150-2350  Protein:  105-120 grams  Fluid:  >2L/d  Lucas Mallow, RD, LDN, CNSC Please refer to [facility] for contact information.

## 2021-05-22 NOTE — Progress Notes (Addendum)
      [address]       [place] [address]             [phone]      2 Days Post-Op Procedure(s) (LRB): ESOPHAGOGASTRODUODENOSCOPY (EGD) WITH PROPOFOL (N/A) COLONOSCOPY WITH PROPOFOL (N/A) BIOPSY POLYPECTOMY Subjective: Uncomfortable this morning, he has not had his sitz bath.   Objective: Vital signs in last 24 hours: Temp:  [98 F (36.7 C)-98.7 F (37.1 C)] 98.5 F (36.9 C) (10/20 0326) Pulse Rate:  [90-109] 90 (10/20 0326) Cardiac Rhythm: Normal sinus rhythm (10/19 1928) Resp:  [18-20] 18 (10/20 0326) BP: (119-153)/(62-92) 123/62 (10/20 0326) SpO2:  [95 %-98 %] 98 % (10/20 0326)     Intake/Output from previous day: 10/19 0701 - 10/20 0700 In: -  Out: 90 [Chest Tube:90] Intake/Output this shift: No intake/output data recorded.  General appearance: alert, cooperative, and no distress Heart: regular rate and rhythm, S1, S2 normal, no murmur, click, rub or gallop Lungs: clear to auscultation bilaterally Abdomen: soft, non-tender; bowel sounds normal; no masses,  no organomegaly Extremities: extremities normal, atraumatic, no cyanosis or edema Wound: clean and dry  Lab Results: Recent Labs    05/21/21 0138 05/22/21 0207  WBC 28.7* 22.0*  HGB 8.0* 7.6*  HCT 26.1* 24.5*  PLT 570* 579*   BMET:  Recent Labs    05/21/21 0138 05/22/21 0207  NA 138 137  K 3.2* 3.4*  CL 112* 109  CO2 19* 20*  GLUCOSE 111* 95  BUN 18 12  CREATININE 1.09 0.97  CALCIUM 8.2* 8.4*    PT/INR: No results for input(s): LABPROT, INR in the last 72 hours. ABG No results found for: PHART, HCO3, TCO2, ACIDBASEDEF, O2SAT CBG (last 3)  No results for input(s): GLUCAP in the last 72 hours.  Assessment/Plan: S/P Procedure(s) (LRB): ESOPHAGOGASTRODUODENOSCOPY (EGD) WITH PROPOFOL (N/A) COLONOSCOPY WITH PROPOFOL (N/A) BIOPSY POLYPECTOMY  POD-3 left anterior thoracotomy for drainage of pericardial effusion  in a 50yo male presenting with left lung malignancy with  local mediastinal and subcarinal invasion.    Neuro-Pain control is reasonable.    Pericardial drain-output is 90cc in 24 hours. Continue tubes for now. Path on the pericardial fluid and pericardium is pending.   Pulm- tolerating room air with excellent saturation   GI-hemorrhoids-this is his number one complaint. They are very uncomfortable and causing him to not want to get out of the bed. GI & General surgery following. Sitz baths recommended.    Underwent radiation at [facility] yesterday as planned   Plan: Possible removal of pericardial drain today. Encouraged ambulation around the unit.    LOS: 6 days    Elgie Collard 05/22/2021  Chest tube removal today Please call with questions  Andrius Andrepont O Ceana Fiala

## 2021-05-22 NOTE — Progress Notes (Signed)
Chest X ray being preformed currently, [facility] called for radiation appt.   Chrisandra Carota, RN 05/22/2021 1:15 PM

## 2021-05-22 NOTE — Anesthesia Postprocedure Evaluation (Signed)
Anesthesia Post Note  Patient: Christopher Henry  Procedure(s) Performed: ESOPHAGOGASTRODUODENOSCOPY (EGD) WITH PROPOFOL COLONOSCOPY WITH PROPOFOL BIOPSY POLYPECTOMY     Patient location during evaluation: PACU Anesthesia Type: MAC Level of consciousness: awake and alert Pain management: pain level controlled Vital Signs Assessment: post-procedure vital signs reviewed and stable Respiratory status: spontaneous breathing, nonlabored ventilation, respiratory function stable and patient connected to nasal cannula oxygen Cardiovascular status: stable and blood pressure returned to baseline Postop Assessment: no apparent nausea or vomiting Anesthetic complications: no   No notable events documented.  Last Vitals:  Vitals:   05/22/21 1655 05/22/21 1700  BP: 107/66 (!) 118/96  Pulse: (!) 101 100  Resp: 18 19  Temp: 36.5 C 36.5 C  SpO2: 100% 100%    Last Pain:  Vitals:   05/22/21 1700  TempSrc: Oral  PainSc:                  [name]

## 2021-05-23 ENCOUNTER — Encounter (HOSPITAL_COMMUNITY): Payer: Self-pay | Admitting: Family Medicine

## 2021-05-23 ENCOUNTER — Inpatient Hospital Stay (HOSPITAL_COMMUNITY): Payer: 59

## 2021-05-23 ENCOUNTER — Ambulatory Visit
Admit: 2021-05-23 | Discharge: 2021-05-23 | Disposition: A | Discharge status: Home / Self Care | Payer: 59 | Attending: Radiation Oncology | Admitting: Radiation Oncology

## 2021-05-23 ENCOUNTER — Encounter: Payer: Self-pay | Admitting: *Deleted

## 2021-05-23 DIAGNOSIS — J910 Malignant pleural effusion: Secondary | ICD-10-CM

## 2021-05-23 DIAGNOSIS — F419 Anxiety disorder, unspecified: Secondary | ICD-10-CM

## 2021-05-23 DIAGNOSIS — C3431 Malignant neoplasm of lower lobe, right bronchus or lung: Secondary | ICD-10-CM

## 2021-05-23 DIAGNOSIS — D72829 Elevated white blood cell count, unspecified: Secondary | ICD-10-CM

## 2021-05-23 DIAGNOSIS — Z809 Family history of malignant neoplasm, unspecified: Secondary | ICD-10-CM

## 2021-05-23 DIAGNOSIS — Z7289 Other problems related to lifestyle: Secondary | ICD-10-CM

## 2021-05-23 DIAGNOSIS — D649 Anemia, unspecified: Secondary | ICD-10-CM

## 2021-05-23 DIAGNOSIS — Z8249 Family history of ischemic heart disease and other diseases of the circulatory system: Secondary | ICD-10-CM

## 2021-05-23 DIAGNOSIS — C3490 Malignant neoplasm of unspecified part of unspecified bronchus or lung: Secondary | ICD-10-CM

## 2021-05-23 DIAGNOSIS — I10 Essential (primary) hypertension: Secondary | ICD-10-CM

## 2021-05-23 DIAGNOSIS — Z87891 Personal history of nicotine dependence: Secondary | ICD-10-CM

## 2021-05-23 DIAGNOSIS — E441 Mild protein-calorie malnutrition: Secondary | ICD-10-CM

## 2021-05-23 DIAGNOSIS — D75839 Thrombocytosis, unspecified: Secondary | ICD-10-CM

## 2021-05-23 DIAGNOSIS — R918 Other nonspecific abnormal finding of lung field: Secondary | ICD-10-CM | POA: Diagnosis not present

## 2021-05-23 LAB — PROCALCITONIN: Procalcitonin: <0.1 ng/mL

## 2021-05-23 LAB — BASIC METABOLIC PANEL
Anion gap: 8 (ref 5–15)
BUN: 10 mg/dL (ref 6–20)
CO2: 21 mmol/L — ABNORMAL LOW (ref 22–32)
Calcium: 8.6 mg/dL — ABNORMAL LOW (ref 8.9–10.3)
Chloride: 109 mmol/L (ref 98–111)
Creatinine, Ser: 1.15 mg/dL (ref 0.61–1.24)
GFR, Estimated: >60 mL/min (ref >60)
Glucose, Bld: 110 mg/dL — ABNORMAL HIGH (ref 70–99)
Potassium: 3.8 mmol/L (ref 3.5–5.1)
Sodium: 138 mmol/L (ref 135–145)

## 2021-05-23 LAB — CBC
HCT: 25.2 % — ABNORMAL LOW (ref 39.0–52.0)
Hemoglobin: 8 g/dL — ABNORMAL LOW (ref 13.0–17.0)
MCH: 25.3 pg — ABNORMAL LOW (ref 26.0–34.0)
MCHC: 31.7 g/dL (ref 30.0–36.0)
MCV: 79.7 fL — ABNORMAL LOW (ref 80.0–100.0)
Platelets: 642 10*3/uL — ABNORMAL HIGH (ref 150–400)
RBC: 3.16 MIL/uL — ABNORMAL LOW (ref 4.22–5.81)
RDW: 18.6 % — ABNORMAL HIGH (ref 11.5–15.5)
WBC: 26.7 10*3/uL — ABNORMAL HIGH (ref 4.0–10.5)
nRBC: 0 % (ref 0.0–0.2)

## 2021-05-23 LAB — MAGNESIUM: Magnesium: 1.5 mg/dL — ABNORMAL LOW (ref 1.7–2.4)

## 2021-05-23 MED ORDER — GADOBUTROL 1 MMOL/ML IV SOLN
6.1000 mL | Freq: Once | INTRAVENOUS | Status: AC | PRN
  Administered 2021-05-23: 6.1 mL via INTRAVENOUS

## 2021-05-23 MED ORDER — MAGNESIUM SULFATE 2 GM/50ML IV SOLN
2.0000 g | Freq: Once | INTRAVENOUS | Status: AC
  Administered 2021-05-23: 2 g via INTRAVENOUS
  Filled 2021-05-23: qty 50

## 2021-05-23 NOTE — Progress Notes (Signed)
Patient refused sitz bath, patient educated on the need for him to have sitz bath, he verbalized understanding.

## 2021-05-23 NOTE — Consult Note (Addendum)
[place]  [phone] Fax:([phone]   MEDICAL ONCOLOGY - INITIAL CONSULTATION  Referral MD: Dr. Antonieta Pert  Reason for Referral: Non-small cell lung cancer, malignant pericardial effusion  HPI: Christopher Henry is a 50 year old male with a past medical history significant for hypertension and anxiety.  He was seen by his pulmonologist on the day of admission and had difficulty walking into the clinic due to shortness of breath.  He was hypoxic in their office.  He was sent to the emergency department for further evaluation.  Admission lab work notable for hemoglobin of 6.9, WBC 30.8, BUN 31, creatinine 1.58.  His D-dimer was elevated at 3.52.  Chest x-ray showed right lower lobe pneumonia.  CTA chest showed no PE but there was mild narrowing with no significant stenosis of the right main pulmonary artery due to external mass-effect, there was an infiltrative mass of the mediastinum with markedly limited evaluation of its borders due to timing of contrast, associated complete collapse of the right lower lobe with associated complete right lower lobe bronchi occlusion due to endobronchial debris/lesion, question extension of the mass into the carina, associated left to right midline shift due to decreased lung volume and there is concern for underlying malignancy, mediastinal lymphadenopathy, small to moderate volume pericardial effusion, subacute to chronic fracture of the mid sternal body with underlying sclerosis likely due to healing however pathologic fracture is not fully excluded.  On 05/24/2021 he underwent bronchoscopy which showed a large mass bulging from the subcarinal region and indenting both sides of the carina, left mainstem could be traversed and distal airways were patent.  Cytology showed malignant cells consistent with non-small cell lung cancer.  An echocardiogram was performed which showed a large pericardial effusion and is status post left anterior  thoracotomy with drainage of pericardial effusion on 05/27/2021.  Surgical pathology from the pericardial fluid showed metastatic carcinoma most consistent with lung adenocarcinoma.  On 05/28/2021 he had an EGD and colonoscopy was found to have prolapsed grade 4 rectal hemorrhoids.  The patient was seen by radiation oncology on 05/25/2021.  Their full note is not available to me in epic, but it appears the patient has started radiation to his lung mass.  He is currently scheduled to complete radiation on 07/08/2021.  I met with the patient in his hospital room.  He just returned from radiation.  He reports mild discomfort at his prior pericardial drain site.  He reports that his appetite has been poor and he has lost weight recently.  He is not having any headaches or dizziness.  He reports that his breathing is stable.  He denies abdominal pain, nausea, vomiting.  He reports intermittent bleeding from his hemorrhoids.  The patient lives with his fiance.  He has 4 biologic children.  He reports that he quit smoking cigarettes about 2 weeks ago but previously smoked 1.5 packs of cigarettes per day for about 35 years.  He also tells me that he drinks on a daily basis.  Typically he drinks either a pint and a half of liquor or 5 tall beers per day.  Medical oncology was asked see the patient make recommendations regarding his newly diagnosed lung cancer.  Past Medical History:  Diagnosis Date   Anxiety state, unspecified 11/09/2012   Hypertension   :   Past Surgical History:  Procedure Laterality Date   BIOPSY  05/26/2021   Procedure: BIOPSY;  Surgeon: Jackquline Denmark, MD;  Location: [place];  Service: Gastroenterology;;  BRONCHIAL BRUSHINGS  05/04/2021   Procedure: BRONCHIAL BRUSHINGS;  Surgeon: Rigoberto Noel, MD;  Location: [facility] ENDOSCOPY;  Service: Cardiopulmonary;;   BRONCHIAL WASHINGS  05/18/2021   Procedure: BRONCHIAL WASHINGS;  Surgeon: Rigoberto Noel, MD;  Location: [place];  Service:  Cardiopulmonary;;   COLONOSCOPY WITH PROPOFOL N/A 05/28/2021   Procedure: COLONOSCOPY WITH PROPOFOL;  Surgeon: Jackquline Denmark, MD;  Location: [place];  Service: Gastroenterology;  Laterality: N/A;   CRYOTHERAPY  05/14/2021   Procedure: CRYOTHERAPY;  Surgeon: Rigoberto Noel, MD;  Location: [facility] ENDOSCOPY;  Service: Cardiopulmonary;;   ESOPHAGOGASTRODUODENOSCOPY (EGD) WITH PROPOFOL N/A 05/23/2021   Procedure: ESOPHAGOGASTRODUODENOSCOPY (EGD) WITH PROPOFOL;  Surgeon: Jackquline Denmark, MD;  Location: [place];  Service: Gastroenterology;  Laterality: N/A;   FINGER SURGERY     HEMOSTASIS CONTROL  05/14/2021   Procedure: HEMOSTASIS CONTROL;  Surgeon: Rigoberto Noel, MD;  Location: [facility] ENDOSCOPY;  Service: Cardiopulmonary;;   HIP SURGERY     POLYPECTOMY  05/14/2021   Procedure: POLYPECTOMY;  Surgeon: Jackquline Denmark, MD;  Location[facility] ENDOSCOPY;  Service: Gastroenterology;;   VIDEO BRONCHOSCOPY Bilateral 05/27/2021   Procedure: VIDEO BRONCHOSCOPY WITH  BIOPSY;  Surgeon: Rigoberto Noel, MD;  Location: [place];  Service: Cardiopulmonary;  Laterality: Bilateral;   XI ROBOTIC ASSISTED PERICARDIAL WINDOW Left 05/31/2021   Procedure: PERICARDIAL WINDOW;  Surgeon: Lajuana Matte, MD;  Location: [place];  Service: Thoracic;  Laterality: Left;  :   Current Facility-Administered Medications  Medication Dose Route Frequency Provider Last Rate Last Admin   (feeding supplement) PROSource Plus liquid 30 mL  30 mL Oral BID BM Kc, Ramesh, MD   30 mL at 05/22/21 1255   0.9 %  sodium chloride infusion  10 mL/hr Intravenous Once Coralee Pesa, MD       acetaminophen (TYLENOL) 160 MG/5ML solution 650 mg  650 mg Oral Q6H PRN Kc, Ramesh, MD       albuterol (PROVENTIL) (2.5 MG/3ML) 0.083% nebulizer solution 2.5 mg  2.5 mg Nebulization Q4H PRN Orma Flaming, MD       cefTRIAXone (ROCEPHIN) 1 g in sodium chloride 0.9 % 100 mL IVPB  1 g Intravenous Q24H Orma Flaming, MD 200 mL/hr at 05/22/21 2129 1 g at  05/22/21 2129   colchicine tablet 0.6 mg  0.6 mg Oral Daily Reino Bellis B, NP   0.6 mg at 05/23/21 1006   docusate sodium (COLACE) capsule 100 mg  100 mg Oral Daily PRN Winferd Humphrey, PA-C       dronabinol (MARINOL) capsule 2.5 mg  2.5 mg Oral BID AC Kc, Ramesh, MD   2.5 mg at 05/22/21 1732   feeding supplement (ENSURE ENLIVE / ENSURE PLUS) liquid 237 mL  237 mL Oral QID Kc, Ramesh, MD   237 mL at 84/69/62 9528   folic acid (FOLVITE) tablet 1 mg  1 mg Oral Daily Banks, Carole N, DO   1 mg at 05/23/21 1006   hydrocortisone (ANUSOL-HC) suppository 25 mg  25 mg Rectal BID Noemi Chapel P, DO   25 mg at 05/22/21 2131   hydrocortisone-pramoxine (PROCTOFOAM-HC) rectal foam 1 applicator  1 applicator Rectal BID Jackquline Denmark, MD   1 applicator at 41/32/44 0102   magnesium sulfate IVPB 2 g 50 mL  2 g Intravenous Once Kc, Maren Beach, MD 50 mL/hr at 05/23/21 1012 2 g at 05/23/21 1012   melatonin tablet 5 mg  5 mg Oral QHS PRN Shela Leff, MD   5 mg at 06/01/2021 2215  mometasone-formoterol (DULERA) 200-5 MCG/ACT inhaler 2 puff  2 puff Inhalation BID Orma Flaming, MD   2 puff at 05/23/21 0845   And   umeclidinium bromide (INCRUSE ELLIPTA) 62.5 MCG/INH 1 puff  1 puff Inhalation Daily Orma Flaming, MD   1 puff at 05/23/21 0845   morphine 2 MG/ML injection 2 mg  2 mg Intravenous Q2H PRN Barrett, Erin R, PA-C   2 mg at 05/15/2021 0405   multivitamin with minerals tablet 1 tablet  1 tablet Oral Daily Irene Pap N, DO   1 tablet at 05/22/21 0849   pantoprazole (PROTONIX) EC tablet 40 mg  40 mg Oral BID Sharyn Creamer, MD   40 mg at 05/23/21 1006   polyethylene glycol (MIRALAX / GLYCOLAX) packet 17 g  17 g Oral Daily PRN Winferd Humphrey, PA-C       potassium chloride SA (KLOR-CON) CR tablet 20 mEq  20 mEq Oral Daily Kc, Maren Beach, MD   20 mEq at 05/23/21 1015   psyllium (HYDROCIL/METAMUCIL) 1 packet  1 packet Oral Daily Jackquline Denmark, MD   1 packet at 05/22/21 0848   thiamine tablet 100 mg  100 mg Oral  Daily Irene Pap N, DO   100 mg at 05/23/21 1006   Or   thiamine (B-1) injection 100 mg  100 mg Intravenous Daily Hall, Carole N, DO       traMADol (ULTRAM) tablet 50 mg  50 mg Oral Q6H Barrett, Erin R, PA-C   50 mg at 05/23/21 1610   witch hazel-glycerin (TUCKS) pad   Topical PRN Winferd Humphrey, PA-C         No Known Allergies:   Family History  Problem Relation Age of Onset   Hypertension Brother    Cancer Maternal Grandmother        unknown to pt  :   Social History   Socioeconomic History   Marital status: Soil scientist    Spouse name: Not on file   Number of children: Not on file   Years of education: Not on file   Highest education level: Not on file  Occupational History   Not on file  Tobacco Use   Smoking status: Former    Packs/day: 2.00    Years: 35.00    Pack years: 70.00    Types: Cigarettes    Quit date: 05/12/2021    Years since quitting: 0.0   Smokeless tobacco: Never  Substance and Sexual Activity   Alcohol use: Yes    Comment: 1 pint a day   Drug use: No   Sexual activity: Not on file  Other Topics Concern   Not on file  Social History Narrative   Not on file   Social Determinants of Health   Financial Resource Strain: Not on file  Food Insecurity: Not on file  Transportation Needs: Not on file  Physical Activity: Not on file  Stress: Not on file  Social Connections: Not on file  Intimate Partner Violence: Not on file  :  Review of Systems: A comprehensive 14 point review of systems was negative except as noted in the HPI.  Exam: Patient Vitals for the past 24 hrs:  BP Temp Temp src Pulse Resp SpO2  05/23/21 0847 (!) 142/82 98.9 F (37.2 C) Oral 98 18 100 %  05/23/21 0846 -- -- -- -- -- 100 %  05/23/21 0345 130/87 98.2 F (36.8 C) Oral (!) 111 18 98 %  05/22/21 2331 (!) 145/85 98.4  F (36.9 C) Oral 97 20 98 %  05/22/21 2105 -- -- -- -- -- 93 %  05/22/21 2008 131/77 98.9 F (37.2 C) Oral 100 20 100 %  05/22/21 1700 (!)  118/96 97.7 F (36.5 C) Oral 100 19 100 %  05/22/21 1655 107/66 97.7 F (36.5 C) Oral (!) 101 18 100 %  05/22/21 1148 134/83 97.7 F (36.5 C) Oral 88 18 98 %    General: Awake and alert, no distress. Eyes:  no scleral icterus.   ENT:  There were no oropharyngeal lesions.     Lymphatics:  Negative cervical, supraclavicular or axillary adenopathy.   Respiratory: Diminished bilaterally. Cardiovascular:  Regular rate and rhythm, S1/S2, without murmur, rub or gallop.  There was no pedal edema.   GI:  abdomen was soft, flat, nontender, nondistended, without organomegaly.   Musculoskeletal: Strength symmetrical in the upper and lower extremities. Skin exam was without echymosis, petichae.   Neuro exam was nonfocal. Patient was alert and oriented.  Attention was good.   Language was appropriate.  Mood was normal without depression.  Speech was not pressured.  Thought content was not tangential.     Lab Results  Component Value Date   WBC 26.7 (H) 05/23/2021   HGB 8.0 (L) 05/23/2021   HCT 25.2 (L) 05/23/2021   PLT 642 (H) 05/23/2021   GLUCOSE 110 (H) 05/23/2021   ALT 8 05/08/2021   AST 15 05/09/2021   NA 138 05/23/2021   K 3.8 05/23/2021   CL 109 05/23/2021   CREATININE 1.15 05/23/2021   BUN 10 05/23/2021   CO2 21 (L) 05/23/2021    DG Chest 1 View  Result Date: 05/22/2021 CLINICAL DATA:  Left chest tube removal EXAM: CHEST  1 VIEW COMPARISON:  05/16/2021 FINDINGS: Single frontal view of the chest demonstrates interval removal of the left-sided chest tube and mediastinal drain. Cardiac silhouette is mildly enlarged. Persistent adenopathy within the mediastinum and right hilum, with postobstructive changes in the right lower lobe again noted. Small right pleural effusion is again seen unchanged. There is a trace left apical pneumothorax volume estimated less than 5%. Minimal left basilar hypoventilatory changes. No acute bony abnormalities. IMPRESSION: 1. Trace left apical pneumothorax  after left chest tube removal, volume estimated less than 5%. 2. Persistent right lower lobe consolidation concerning for underlying malignancy based on recent CT exam. 3. Persistent mediastinal and right hilar adenopathy. Critical Value/emergent results were called by telephone at the time of interpretation on 05/22/2021 at 3:40 pm to provider [facility] , who verbally acknowledged these results. Electronically Signed   By: Randa Ngo M.D.   On: 05/22/2021 15:53   DG Chest 2 View  Result Date: 06/02/2021 CLINICAL DATA:  Shortness of breath EXAM: CHEST - 2 VIEW COMPARISON:  Radiograph 04/15/2021 FINDINGS: Unchanged cardiomediastinal silhouette. New right lower lobe airspace disease. No large pleural effusion or visible pneumothorax. There is prominence of the azygous vein and right paratracheal stripe. There is an unchanged compression deformity in midthoracic spine. No acute osseous abnormality. IMPRESSION: Right lower lobe pneumonia. Recommend follow-up chest radiograph in 6-12 weeks to ensure resolution. Electronically Signed   By: Maurine Simmering M.D.   On: 05/11/2021 14:21   CT Angio Chest PE W and/or Wo Contrast  Result Date: 05/22/2021 CLINICAL DATA:  Pt here via EMS with c/o SOB with exertion. History of chronic bronchitis. Pt at PCP with SpO2 of 80. Placed on 2 liters with improvement of 100%. Chest tingling. Pt reports rectal bleeding  X1 week EXAM: CT ANGIOGRAPHY CHEST WITH CONTRAST TECHNIQUE: Multidetector CT imaging of the chest was performed using the standard protocol during bolus administration of intravenous contrast. Multiplanar CT image reconstructions and MIPs were obtained to evaluate the vascular anatomy. CONTRAST:  85m OMNIPAQUE IOHEXOL 350 MG/ML SOLN COMPARISON:  Chest x-ray 05/10/2021, chest x-ray 04/15/2021 FINDINGS: Cardiovascular: Satisfactory opacification of the pulmonary arteries to the segmental level. No evidence of pulmonary embolism. The main pulmonary artery is normal  in caliber. There is narrowing but no definite significant stenosis of the right main pulmonary artery due to external mass effect. Normal heart size. Small to moderate volume pericardial effusion. Left-to-right midline shift. Mediastinum/Nodes: Infiltrative mass of the mediastinum with markedly limited evaluation of its borders due to timing of contrast: Measures up to at least 5.8 cm on axial imaging (10:229). Mediastinal lymphadenopathy with as an example a 2.7 cm right paratracheal lymph node (2:58). No definite left hilar lymphadenopathy. No axillary nodes. Question extension of the mass into the carina (11:61). Thyroid gland demonstrate no significant findings. The esophagus is not well visualized due to abutment to the mass. Lungs/Pleura: Complete collapse of the right lower lobe with associated endobronchial debris/lesion. A 1.7 cm ground-glass airspace opacity at the right apex. Associated vague peripheral ground-glass airspace opacities within the right upper lobe. At least mild to moderate paraseptal and centrilobular emphysematous changes. No focal consolidation, pulmonary nodule, mass of the left lung. No left pleural effusion. No right pleural effusion. No pneumothorax. Upper Abdomen: No acute abnormality. Musculoskeletal: Minimally displaced subacute to chronic fracture of the mid sternal body. Associated underlying cirrhosis may represent pathologic fracture versus healing. Otherwise no suspicious lytic or blastic osseous lesions. No acute displaced fracture. Multilevel degenerative changes of the spine. Slight vertebral body height loss anteriorly of the T9 vertebral body. Review of the MIP images confirms the above findings. IMPRESSION: 1. No pulmonary embolus; however, mild narrowing with no significant stenosis of the right main pulmonary artery due to external mass effect from the mass described below. 2. Infiltrative mass of the mediastinum with markedly limited evaluation of its borders due to  timing of contrast. Associated complete collapse of the right lower lobe with associated complete right lower lobe bronchi occlusion due to endobronchial debris/lesion. Question extension of the mass into the carina.Associated left-to-right midline shift due to decreased lung volume. Concern for underlying malignancy - likely of the mediastinum with extent into or around the right mainstem bronchi leading to collapse the right lower lobe. Additional imaging evaluation or consultation with Pulmonology or Thoracic Surgery recommended. 3. Mediastinal lymphadenopathy. 4. A 1.7 cm ground-glass airspace opacity at the right apex. Associated vague peripheral ground-glass airspace opacities within the right upper lobe. 5. Small to moderate volume pericardial effusion. 6. Subacute to chronic fracture of the mid sternal body. Underlying sclerosis likely due to healing; however, pathologic fracture is not fully excluded. 7. Emphysema (ICD10-J43.9). Electronically Signed   By: MIven FinnM.D.   On: 06/02/2021 18:12   DG CHEST PORT 1 VIEW  Result Date: 05/31/2021 CLINICAL DATA:  Pericardial effusion. EXAM: PORTABLE CHEST 1 VIEW COMPARISON:  May 19, 2021 FINDINGS: The cardiomediastinal silhouette is unchanged and enlarged in contour.Pericardial drain. LEFT-sided chest tube. Small RIGHT pleural effusion, similar in comparison to prior. No significant pneumothorax. Persistent interstitial prominence and haziness of the basilar vasculature most consistent with pulmonary edema and scattered atelectasis. Visualized abdomen is unremarkable. LEFT-sided subcutaneous air. IMPRESSION: Pericardial drain and LEFT-sided chest tube without significant pneumothorax. Persistent small RIGHT pleural effusion.  Electronically Signed   By: Valentino Saxon M.D.   On: 05/12/2021 08:16   DG CHEST PORT 1 VIEW  Result Date: 05/04/2021 CLINICAL DATA:  Pericardial effusion EXAM: PORTABLE CHEST 1 VIEW COMPARISON:  Chest x-ray dated  May 16, 2021 FINDINGS: Cardiac and mediastinal contours are unchanged. Unchanged narrowing of the right mainstem bronchus, likely due to known mediastinal adenopathy. New mediastinal drain and left-sided chest tube. Small right pleural effusion and atelectasis. No pneumothorax. IMPRESSION: Interval placement of mediastinal drain and left-sided chest tube. No evidence of pneumothorax. Small right pleural effusion and atelectasis. Electronically Signed   By: Yetta Glassman M.D.   On: 05/27/2021 15:52   ECHOCARDIOGRAM COMPLETE  Result Date: 05/27/2021    ECHOCARDIOGRAM REPORT   Patient Name:   Christopher Henry Date of Exam: 05/26/2021 Medical Rec #:  [id number]    Height:       76.0 in Accession #:    [id number]   Weight:       135.1 lb Date of Birth:  02-Jun-1971     BSA:          1.875 m Patient Age:    53 years     BP:           108/78 mmHg Patient Gender: M            HR:           107 bpm. Exam Location:  Inpatient Procedure: 2D Echo and 3D Echo Indications:    Pericardial effusion  History:        Patient has no prior history of Echocardiogram examinations.                 COPD; Risk Factors:Current Smoker. ETOH abuse.  Sonographer:    Clayton Lefort RDCS (AE) Referring Phys: [id number] RAKESH V ALVA  Sonographer Comments: Suboptimal subcostal window. IMPRESSIONS  1. Left ventricular ejection fraction, by estimation, is 60 to 65%. The left ventricle has normal function. The left ventricle has no regional wall motion abnormalities. Left ventricular diastolic parameters were normal.  2. Right ventricular systolic function is normal. The right ventricular size is normal.  3. Large circumferential pericardial effusion largest in posterior and lateral aspects of LV IVC not well seen no respiratory variation in mitral inflow and only RA diastolic collapse not RV. Given likely diagnosis of metastatic lung cancer consider CVTS consult for pericardial window and for both Rx and diagnostic reasons . Large pericardial effusion.  The pericardial effusion is circumferential.  4. The mitral valve is normal in structure. Trivial mitral valve regurgitation. No evidence of mitral stenosis.  5. The aortic valve is tricuspid. Aortic valve regurgitation is not visualized. Mild to moderate aortic valve sclerosis/calcification is present, without any evidence of aortic stenosis.  6. The inferior vena cava is normal in size with greater than 50% respiratory variability, suggesting right atrial pressure of 3 mmHg. FINDINGS  Left Ventricle: Left ventricular ejection fraction, by estimation, is 60 to 65%. The left ventricle has normal function. The left ventricle has no regional wall motion abnormalities. The left ventricular internal cavity size was normal in size. There is  no left ventricular hypertrophy. Left ventricular diastolic parameters were normal. Right Ventricle: The right ventricular size is normal. No increase in right ventricular wall thickness. Right ventricular systolic function is normal. Left Atrium: Left atrial size was normal in size. Right Atrium: Right atrial size was normal in size. Pericardium: Large circumferential pericardial effusion largest in posterior and lateral aspects of  LV IVC not well seen no respiratory variation in mitral inflow and only RA diastolic collapse not RV. Given likely diagnosis of metastatic lung cancer consider CVTS consult for pericardial window and for both Rx and diagnostic reasons. A large pericardial effusion is present. The pericardial effusion is circumferential. Mitral Valve: The mitral valve is normal in structure. There is mild thickening of the mitral valve leaflet(s). There is mild calcification of the mitral valve leaflet(s). Trivial mitral valve regurgitation. No evidence of mitral valve stenosis. Tricuspid Valve: The tricuspid valve is normal in structure. Tricuspid valve regurgitation is not demonstrated. No evidence of tricuspid stenosis. Aortic Valve: The aortic valve is tricuspid.  Aortic valve regurgitation is not visualized. Mild to moderate aortic valve sclerosis/calcification is present, without any evidence of aortic stenosis. Aortic valve mean gradient measures 4.0 mmHg. Aortic valve peak gradient measures 5.9 mmHg. Aortic valve area, by VTI measures 2.93 cm. Pulmonic Valve: The pulmonic valve was normal in structure. Pulmonic valve regurgitation is not visualized. No evidence of pulmonic stenosis. Aorta: The aortic root is normal in size and structure. Venous: The inferior vena cava is normal in size with greater than 50% respiratory variability, suggesting right atrial pressure of 3 mmHg. IAS/Shunts: No atrial level shunt detected by color flow Doppler.  LEFT VENTRICLE PLAX 2D LVIDd:         4.10 cm   Diastology LVIDs:         2.60 cm   LV e' medial:    8.55 cm/s LV PW:         1.00 cm   LV E/e' medial:  10.2 LV IVS:        0.90 cm   LV e' lateral:   8.78 cm/s LVOT diam:     2.20 cm   LV E/e' lateral: 10.0 LV SV:         44 LV SV Index:   23 LVOT Area:     3.80 cm  RIGHT VENTRICLE RV Basal diam:  2.70 cm RV S prime:     25.50 cm/s TAPSE (M-mode): 2.8 cm LEFT ATRIUM             Index        RIGHT ATRIUM           Index LA diam:        3.00 cm 1.60 cm/m   RA Area:     13.20 cm LA Vol (A2C):   21.3 ml 11.36 ml/m  RA Volume:   32.30 ml  17.22 ml/m LA Vol (A4C):   51.5 ml 27.46 ml/m LA Biplane Vol: 33.5 ml 17.86 ml/m  AORTIC VALVE AV Area (Vmax):    2.87 cm AV Area (Vmean):   2.66 cm AV Area (VTI):     2.93 cm AV Vmax:           121.00 cm/s AV Vmean:          93.200 cm/s AV VTI:            0.149 m AV Peak Grad:      5.9 mmHg AV Mean Grad:      4.0 mmHg LVOT Vmax:         91.30 cm/s LVOT Vmean:        65.100 cm/s LVOT VTI:          0.115 m LVOT/AV VTI ratio: 0.77  AORTA Ao Root diam: 2.90 cm Ao Asc diam:  3.10 cm MITRAL VALVE  TRICUSPID VALVE MV Area (PHT): 5.34 cm    TR Peak grad:   21.5 mmHg MV Decel Time: 142 msec    TR Vmax:        232.00 cm/s MV E velocity:  87.50 cm/s MV A velocity: 78.30 cm/s  SHUNTS MV E/A ratio:  1.12        Systemic VTI:  0.12 m                            Systemic Diam: 2.20 cm Jenkins Rouge MD Electronically signed by Jenkins Rouge MD Signature Date/Time: 05/06/2021/3:57:03 PM    Final      DG Chest 1 View  Result Date: 05/22/2021 CLINICAL DATA:  Left chest tube removal EXAM: CHEST  1 VIEW COMPARISON:  05/19/2021 FINDINGS: Single frontal view of the chest demonstrates interval removal of the left-sided chest tube and mediastinal drain. Cardiac silhouette is mildly enlarged. Persistent adenopathy within the mediastinum and right hilum, with postobstructive changes in the right lower lobe again noted. Small right pleural effusion is again seen unchanged. There is a trace left apical pneumothorax volume estimated less than 5%. Minimal left basilar hypoventilatory changes. No acute bony abnormalities. IMPRESSION: 1. Trace left apical pneumothorax after left chest tube removal, volume estimated less than 5%. 2. Persistent right lower lobe consolidation concerning for underlying malignancy based on recent CT exam. 3. Persistent mediastinal and right hilar adenopathy. Critical Value/emergent results were called by telephone at the time of interpretation on 05/22/2021 at 3:40 pm to provider [facility] , who verbally acknowledged these results. Electronically Signed   By: Randa Ngo M.D.   On: 05/22/2021 15:53   DG Chest 2 View  Result Date: 05/22/2021 CLINICAL DATA:  Shortness of breath EXAM: CHEST - 2 VIEW COMPARISON:  Radiograph 04/15/2021 FINDINGS: Unchanged cardiomediastinal silhouette. New right lower lobe airspace disease. No large pleural effusion or visible pneumothorax. There is prominence of the azygous vein and right paratracheal stripe. There is an unchanged compression deformity in midthoracic spine. No acute osseous abnormality. IMPRESSION: Right lower lobe pneumonia. Recommend follow-up chest radiograph in 6-12 weeks to ensure  resolution. Electronically Signed   By: Maurine Simmering M.D.   On: 05/30/2021 14:21   CT Angio Chest PE W and/or Wo Contrast  Result Date: 05/20/2021 CLINICAL DATA:  Pt here via EMS with c/o SOB with exertion. History of chronic bronchitis. Pt at PCP with SpO2 of 80. Placed on 2 liters with improvement of 100%. Chest tingling. Pt reports rectal bleeding X1 week EXAM: CT ANGIOGRAPHY CHEST WITH CONTRAST TECHNIQUE: Multidetector CT imaging of the chest was performed using the standard protocol during bolus administration of intravenous contrast. Multiplanar CT image reconstructions and MIPs were obtained to evaluate the vascular anatomy. CONTRAST:  1mL OMNIPAQUE IOHEXOL 350 MG/ML SOLN COMPARISON:  Chest x-ray 05/24/2021, chest x-ray 04/15/2021 FINDINGS: Cardiovascular: Satisfactory opacification of the pulmonary arteries to the segmental level. No evidence of pulmonary embolism. The main pulmonary artery is normal in caliber. There is narrowing but no definite significant stenosis of the right main pulmonary artery due to external mass effect. Normal heart size. Small to moderate volume pericardial effusion. Left-to-right midline shift. Mediastinum/Nodes: Infiltrative mass of the mediastinum with markedly limited evaluation of its borders due to timing of contrast: Measures up to at least 5.8 cm on axial imaging (10:229). Mediastinal lymphadenopathy with as an example a 2.7 cm right paratracheal lymph node (2:58). No definite left hilar lymphadenopathy. No axillary  nodes. Question extension of the mass into the carina (11:61). Thyroid gland demonstrate no significant findings. The esophagus is not well visualized due to abutment to the mass. Lungs/Pleura: Complete collapse of the right lower lobe with associated endobronchial debris/lesion. A 1.7 cm ground-glass airspace opacity at the right apex. Associated vague peripheral ground-glass airspace opacities within the right upper lobe. At least mild to moderate  paraseptal and centrilobular emphysematous changes. No focal consolidation, pulmonary nodule, mass of the left lung. No left pleural effusion. No right pleural effusion. No pneumothorax. Upper Abdomen: No acute abnormality. Musculoskeletal: Minimally displaced subacute to chronic fracture of the mid sternal body. Associated underlying cirrhosis may represent pathologic fracture versus healing. Otherwise no suspicious lytic or blastic osseous lesions. No acute displaced fracture. Multilevel degenerative changes of the spine. Slight vertebral body height loss anteriorly of the T9 vertebral body. Review of the MIP images confirms the above findings. IMPRESSION: 1. No pulmonary embolus; however, mild narrowing with no significant stenosis of the right main pulmonary artery due to external mass effect from the mass described below. 2. Infiltrative mass of the mediastinum with markedly limited evaluation of its borders due to timing of contrast. Associated complete collapse of the right lower lobe with associated complete right lower lobe bronchi occlusion due to endobronchial debris/lesion. Question extension of the mass into the carina.Associated left-to-right midline shift due to decreased lung volume. Concern for underlying malignancy - likely of the mediastinum with extent into or around the right mainstem bronchi leading to collapse the right lower lobe. Additional imaging evaluation or consultation with Pulmonology or Thoracic Surgery recommended. 3. Mediastinal lymphadenopathy. 4. A 1.7 cm ground-glass airspace opacity at the right apex. Associated vague peripheral ground-glass airspace opacities within the right upper lobe. 5. Small to moderate volume pericardial effusion. 6. Subacute to chronic fracture of the mid sternal body. Underlying sclerosis likely due to healing; however, pathologic fracture is not fully excluded. 7. Emphysema (ICD10-J43.9). Electronically Signed   By: Iven Finn M.D.   On:  05/08/2021 18:12   DG CHEST PORT 1 VIEW  Result Date: 05/12/2021 CLINICAL DATA:  Pericardial effusion. EXAM: PORTABLE CHEST 1 VIEW COMPARISON:  May 19, 2021 FINDINGS: The cardiomediastinal silhouette is unchanged and enlarged in contour.Pericardial drain. LEFT-sided chest tube. Small RIGHT pleural effusion, similar in comparison to prior. No significant pneumothorax. Persistent interstitial prominence and haziness of the basilar vasculature most consistent with pulmonary edema and scattered atelectasis. Visualized abdomen is unremarkable. LEFT-sided subcutaneous air. IMPRESSION: Pericardial drain and LEFT-sided chest tube without significant pneumothorax. Persistent small RIGHT pleural effusion. Electronically Signed   By: Valentino Saxon M.D.   On: 05/22/2021 08:16   DG CHEST PORT 1 VIEW  Result Date: 05/26/2021 CLINICAL DATA:  Pericardial effusion EXAM: PORTABLE CHEST 1 VIEW COMPARISON:  Chest x-ray dated May 16, 2021 FINDINGS: Cardiac and mediastinal contours are unchanged. Unchanged narrowing of the right mainstem bronchus, likely due to known mediastinal adenopathy. New mediastinal drain and left-sided chest tube. Small right pleural effusion and atelectasis. No pneumothorax. IMPRESSION: Interval placement of mediastinal drain and left-sided chest tube. No evidence of pneumothorax. Small right pleural effusion and atelectasis. Electronically Signed   By: Yetta Glassman M.D.   On: 05/27/2021 15:52   ECHOCARDIOGRAM COMPLETE  Result Date: 05/15/2021    ECHOCARDIOGRAM REPORT   Patient Name:   Christopher Henry Date of Exam: 05/22/2021 Medical Rec #:  [id number]    Height:       76.0 in Accession #:    [id number]  Weight:       135.1 lb Date of Birth:  01/31/1971     BSA:          1.875 m Patient Age:    50 years     BP:           108/78 mmHg Patient Gender: M            HR:           107 bpm. Exam Location:  Inpatient Procedure: 2D Echo and 3D Echo Indications:    Pericardial effusion   History:        Patient has no prior history of Echocardiogram examinations.                 COPD; Risk Factors:Current Smoker. ETOH abuse.  Sonographer:    Clayton Lefort RDCS (AE) Referring Phys: [id number] RAKESH V ALVA  Sonographer Comments: Suboptimal subcostal window. IMPRESSIONS  1. Left ventricular ejection fraction, by estimation, is 60 to 65%. The left ventricle has normal function. The left ventricle has no regional wall motion abnormalities. Left ventricular diastolic parameters were normal.  2. Right ventricular systolic function is normal. The right ventricular size is normal.  3. Large circumferential pericardial effusion largest in posterior and lateral aspects of LV IVC not well seen no respiratory variation in mitral inflow and only RA diastolic collapse not RV. Given likely diagnosis of metastatic lung cancer consider CVTS consult for pericardial window and for both Rx and diagnostic reasons . Large pericardial effusion. The pericardial effusion is circumferential.  4. The mitral valve is normal in structure. Trivial mitral valve regurgitation. No evidence of mitral stenosis.  5. The aortic valve is tricuspid. Aortic valve regurgitation is not visualized. Mild to moderate aortic valve sclerosis/calcification is present, without any evidence of aortic stenosis.  6. The inferior vena cava is normal in size with greater than 50% respiratory variability, suggesting right atrial pressure of 3 mmHg. FINDINGS  Left Ventricle: Left ventricular ejection fraction, by estimation, is 60 to 65%. The left ventricle has normal function. The left ventricle has no regional wall motion abnormalities. The left ventricular internal cavity size was normal in size. There is  no left ventricular hypertrophy. Left ventricular diastolic parameters were normal. Right Ventricle: The right ventricular size is normal. No increase in right ventricular wall thickness. Right ventricular systolic function is normal. Left Atrium: Left  atrial size was normal in size. Right Atrium: Right atrial size was normal in size. Pericardium: Large circumferential pericardial effusion largest in posterior and lateral aspects of LV IVC not well seen no respiratory variation in mitral inflow and only RA diastolic collapse not RV. Given likely diagnosis of metastatic lung cancer consider CVTS consult for pericardial window and for both Rx and diagnostic reasons. A large pericardial effusion is present. The pericardial effusion is circumferential. Mitral Valve: The mitral valve is normal in structure. There is mild thickening of the mitral valve leaflet(s). There is mild calcification of the mitral valve leaflet(s). Trivial mitral valve regurgitation. No evidence of mitral valve stenosis. Tricuspid Valve: The tricuspid valve is normal in structure. Tricuspid valve regurgitation is not demonstrated. No evidence of tricuspid stenosis. Aortic Valve: The aortic valve is tricuspid. Aortic valve regurgitation is not visualized. Mild to moderate aortic valve sclerosis/calcification is present, without any evidence of aortic stenosis. Aortic valve mean gradient measures 4.0 mmHg. Aortic valve peak gradient measures 5.9 mmHg. Aortic valve area, by VTI measures 2.93 cm. Pulmonic Valve: The pulmonic valve was normal  in structure. Pulmonic valve regurgitation is not visualized. No evidence of pulmonic stenosis. Aorta: The aortic root is normal in size and structure. Venous: The inferior vena cava is normal in size with greater than 50% respiratory variability, suggesting right atrial pressure of 3 mmHg. IAS/Shunts: No atrial level shunt detected by color flow Doppler.  LEFT VENTRICLE PLAX 2D LVIDd:         4.10 cm   Diastology LVIDs:         2.60 cm   LV e' medial:    8.55 cm/s LV PW:         1.00 cm   LV E/e' medial:  10.2 LV IVS:        0.90 cm   LV e' lateral:   8.78 cm/s LVOT diam:     2.20 cm   LV E/e' lateral: 10.0 LV SV:         44 LV SV Index:   23 LVOT Area:      3.80 cm  RIGHT VENTRICLE RV Basal diam:  2.70 cm RV S prime:     25.50 cm/s TAPSE (M-mode): 2.8 cm LEFT ATRIUM             Index        RIGHT ATRIUM           Index LA diam:        3.00 cm 1.60 cm/m   RA Area:     13.20 cm LA Vol (A2C):   21.3 ml 11.36 ml/m  RA Volume:   32.30 ml  17.22 ml/m LA Vol (A4C):   51.5 ml 27.46 ml/m LA Biplane Vol: 33.5 ml 17.86 ml/m  AORTIC VALVE AV Area (Vmax):    2.87 cm AV Area (Vmean):   2.66 cm AV Area (VTI):     2.93 cm AV Vmax:           121.00 cm/s AV Vmean:          93.200 cm/s AV VTI:            0.149 m AV Peak Grad:      5.9 mmHg AV Mean Grad:      4.0 mmHg LVOT Vmax:         91.30 cm/s LVOT Vmean:        65.100 cm/s LVOT VTI:          0.115 m LVOT/AV VTI ratio: 0.77  AORTA Ao Root diam: 2.90 cm Ao Asc diam:  3.10 cm MITRAL VALVE               TRICUSPID VALVE MV Area (PHT): 5.34 cm    TR Peak grad:   21.5 mmHg MV Decel Time: 142 msec    TR Vmax:        232.00 cm/s MV E velocity: 87.50 cm/s MV A velocity: 78.30 cm/s  SHUNTS MV E/A ratio:  1.12        Systemic VTI:  0.12 m                            Systemic Diam: 2.20 cm Jenkins Rouge MD Electronically signed by Jenkins Rouge MD Signature Date/Time: 06/02/2021/3:57:03 PM    Final     Pathology:  SURGICAL PATHOLOGY  CASE: [id number]  PATIENT: Christopher Henry  Surgical Pathology Report   Clinical History: pericardial effusion (cm)   FINAL MICROSCOPIC DIAGNOSIS:   A. PERICARDIUM, BIOPSY:  -  Metastatic  carcinoma  -  See comment   COMMENT:   The pericardium shows a mixed cellular infiltrate composed of malignant  appearing cells and inflammatory cells including neutrophils and  lymphocytes.  By immunohistochemistry, the malignant cells are positive  for cytokeratin AE1/3 and patchy positivity for TTF-1.  The cells are  negative for CD30.  CD45, CD20 and CD3 highlight inflammatory cells.  Overall, the findings are consistent with a metastatic carcinoma.  Given  the TTF-1 positivity, lung  adenocarcinoma is included in the  differential among others.  Dr. Saralyn Pilar reviewed the case and agrees  with the above diagnosis.  [name] was notified of these results  on May 22, 2021.   CYTOLOGY - NON PAP  CASE: [id number]  PATIENT: Christopher Henry  Non-Gynecological Cytology Report   Clinical History: Hilar / mediastinal lymphadenopathy  Specimen Submitted:  A. LUNG, RIGHT MAIN STEM, BRUSHING:   FINAL MICROSCOPIC DIAGNOSIS:  - Malignant cells consistent with non-small cell carcinoma   SPECIMEN ADEQUACY:  Satisfactory for evaluation   GROSS:  Received 20cc's of clear fluid with a brush.(ES:es)  Smears: 0  Concentration Method (Thin Prep): 1  Cell Block: 0  Additional Studies: Bronch Lavage [id number]    Final Diagnosis performed by Thressa Sheller, MD.   Electronically signed  05/22/2021   Assessment and Plan:  This is a 50 year old male with  1) metastatic non-small cell lung cancer -CTA chest performed 05/30/2021- "1. No pulmonary embolus; however, mild narrowing with no significant stenosis of the right main pulmonary artery due to external mass effect from the mass described below.2. Infiltrative mass of the mediastinum with markedly limited evaluation of its borders due to timing of contrast. Associated complete collapse of the right lower lobe with associated complete right lower lobe bronchi occlusion due to endobronchial debris/lesion. Question extension of the mass into the carina.Associated left-to-right midline shift due to decreased lung volume. Concern for underlying malignancy - likely of the mediastinum with extent into or around the right mainstem bronchi leading to collapse the right lower lobe. Additional imaging evaluation or consultation with Pulmonology or Thoracic Surgery recommended. 3. Mediastinal lymphadenopathy. 4. A 1.7 cm ground-glass airspace opacity at the right apex. Associated vague peripheral ground-glass airspace opacities within the  right upper lobe. 5. Small to moderate volume pericardial effusion. 6. Subacute to chronic fracture of the mid sternal body. Underlying sclerosis likely due to healing; however, pathologic fracture is not fully excluded. 7. Emphysema (ICD10-J43.9)."  2) malignant pericardial effusion  3) microcytic anemia -Labs from 05/18/2021 showed a ferritin of 74, iron 10, TIBC 280, percent saturation 4%  4) leukocytosis  5) thrombocytosis  6) protein calorie malnutrition  7) alcohol use/recent tobacco use  PLAN: -Recommended for the patient to complete his initial staging work-up including an MRI of the brain with and without contrast as well as a CT of the abdomen/pelvis. -Nurse navigator has contacted pathology to request for biopsy to be sent for PD-L1 and foundation 1 studies. -He will continue radiation under the care of Dr. Lisbeth Renshaw. -Pending results of above work-up, will consider him for chemotherapy with immunotherapy versus targeted therapy following the completion of radiation or may consider giving concurrently if no significant disease outside of the chest. -For his anemia, recommend transfusion for hemoglobin less than 7.5.  Thank you for this referral.   Mikey Bussing, DNP, AGPCNP-BC, AOCNP    ADDENDUM  .Patient was Personally and independently interviewed, examined and relevant elements of the history of present illness were reviewed in details  and an assessment and plan was created. All elements of the patient's history of present illness , assessment and plan were discussed in details with Mikey Bussing, DNP, AGPCNP-BC, AOCNP . The above documentation reflects our combined findings assessment and plan.   Sullivan Lone MD MS

## 2021-05-23 NOTE — Plan of Care (Signed)
  Problem: Clinical Measurements: Goal: Will remain free from infection Outcome: Progressing Goal: Cardiovascular complication will be avoided Outcome: Progressing   Problem: Activity: Goal: Risk for activity intolerance will decrease Outcome: Progressing   Problem: Coping: Goal: Level of anxiety will decrease Outcome: Progressing

## 2021-05-23 NOTE — Progress Notes (Signed)
PROGRESS NOTE    Christopher Henry  KZL:[id number] DOB: Sep 06, 1970 DOA: 06/02/2021 PCP: Bary Castilla, NP   Chief Complaint  Patient presents with   Shortness of Breath    Brief Narrative/Hospital Course:  Christopher Henry, 50 y.o. male with PMH of COPD, HTN, alcohol abuse, tobacco use 1 pack/day x 35 years sent from pulmonary office to ED for acute respiratory distress hypoxic 80% on room air, nonproductive cough and hematemesis over the past 2 months, burning sensation on the chest and greater than 50 pound weight loss x12 months. In the ED, anemic hemoglobin 6.9 g, leukocytosis, D-dimer 3.5, initial troponin 49 lactic acidosis 2.4 creatinine 1.5 chest x-ray RLL pneumonia. CT 18/14 no PE, infiltrative mass of the mediastinum, complete collapse of the right lower lobe with associated complete right lower lobe bronchi occlusion due to endobronchial debris/lesion,?  Mass extension into the carina, left to right midline shift due to decreased lung volume-concern for malignancy likely of the mediastinum with extension into or around the right mainstem bronchi leading to collapse of the right lower lobe, mediastinal lymphadenopathy, 1.7 cm groundglass opacity right apex, small to moderate volume pericardial effusion, subacute to chronic fracture of the medial sternal body. 10/15 underwent bronchoscopy-showed large subcarinal mass indenting the carina and bulging and bilaterally with large mainstem lesion obscuring 50% of lumen, biopsies taken. Large pericardial effusion noted in the echo, cardiology and thoracic surgery following S/p left anterior thoracotomy with drainage of pericardial effusion -open pericardial window by [name] 10/17.  10/18-underwent EGD colonoscopy found to have prolapsed grade 4 rectal hemorrhoids, and patient also started on radiotherapy  Subjective: Seen this morning.  He denies any pain -but complains of shortness of breath with activity using bedside commode  Overnight  no fever, on room air.  Chest tube drains has been removed, WBC count uptrending this morning.  H&H stable.  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 biopsy showed atypical cells.Rad Onc has seen  and radiotherapy started 10/19 urgently.  He is a scheduled to complete radiation treatment 07/08/2021. continue pain control.  We will consult hematology/oncology for further recommendation and plan.  Malignant large pericardial effusion: S/p open pericardial window by [name] 10/17.  Drain removed 10/20 and chest x-ray no pneumothorax.  Pericardial biopsy showed metastatic carcinoma.   Acute hypoxemic respiratory failure - was hypoxic 80% on room air with respiratory distress on admission 2/2 pneumonia/mediastinal mass. Currently on room air.    Sepsis secondary to pneumonia RLL post obstructive pneumonia: In the setting of malignancy.  Remains intermittently febrile also with leukocytosis-leukocytosis further uptrending.  Being treated with ceftriaxone, completed azithromycin, WBC count now slowly uptrending remains elevated likely multifactorial with  ongoing radiotherapy/postop obstructive process- reactive.  He is afebrile and on room air.  Monitor procal. Recent Labs  Lab 05/12/2021 1604 05/22/2021 1755 05/03/2021 2054 05/09/2021 0743 05/18/21 0221 05/17/2021 0430 05/08/2021 0231 05/21/21 0138 05/22/21 0207 05/23/21 0053  WBC  --   --   --  25.3* 25.5* 30.4* 35.1* 28.7* 22.0* 26.7*  LATICACIDVEN 2.4* 2.7*  --   --   --   --   --   --   --   --   PROCALCITON  --   --  0.19 0.25 0.25  --   --   --   --   --  Hematochezia/melena Acute blood loss anemia in the setting  of painless rectal bleeding due to hemorrhoids Anemia of chronic disease as well S/P multiple PRBC transfusions.  Stable  now-Transfuse if less than 7 g.  S/p EGD colonoscopy 10/18-mild esophagitis-could be Candida -biopsy pending, large grade 4 prolapsed hemorrhoids-general surgery consulted and advised medical and topical care no plan for surgery at this time- w/ H prep, sitz bath (patient refusing bath).  Patient nasal Monitor h/h-May need transfusion if further downtrending Recent Labs  Lab 05/04/2021 0832 05/09/2021 0231 05/21/21 0138 05/22/21 0207 05/23/21 0053  HGB 8.0* 8.2* 8.0* 7.6* 8.0*  HCT 24.3* 25.7* 26.1* 24.5* 25.2*    Candida esophagitis: Start fluconazole   Hypomagnesemia repleted  AKI in the setting of dehydration/pneumonia/malignancy.  Resolved Anion gap metabolic acidosis due to AKI: By 12/09/2019.  R Hypokalemia : Resolved  Possible COPD: Currently not in exacerbation.Continue Dulera,Incruse Ellipta and bronchodilators as needed  Severe protein calorie moderation with significant weight loss and BMI 16.45.  In the setting of malignancy overall poor prognostic indicator.  History of alcohol use/tobacco use patient reports he recently quit.  Continue thiamine folic acid multivitamins and watch for withdrawals.  Deconditioning: PT OT eval requested  Severe malnutrition augment diet as below added Marinol. Nutrition Problem: Severe Malnutrition Etiology: acute illness (newly diagnosed lung cancer) Signs/Symptoms: severe muscle depletion, severe fat depletion, percent weight loss (6% weight loss in one month) Percent weight loss: 6 % Interventions: MVI, Magic cup, Ensure Enlive (each supplement provides 350kcal and 20 grams of protein)    DVT prophylaxis: SCDs Start: 06/02/2021 2105.  No chemeical prophylaxis due to anemia Code Status:   Code Status: Full Code Family Communication: plan of care discussed with patient his fiance and the mother at bedside. Overall  prognosis guarded/poor  Status is: Inpatient Remains inpatient appropriate because: Ongoing management of anemia, pericardial effusion, lung mass pneumonia.  Objective: Vitals: Today's Vitals   05/22/21 2331 05/22/21 2336 05/23/21 0345 05/23/21 0548  BP: (!) 145/85  130/87   Pulse: 97  (!) 111   Resp: 20  18   Temp: 98.4 F (36.9 C)  98.2 F (36.8 C)   TempSrc: Oral  Oral   SpO2: 98%  98%   Weight:      PainSc:  0-No pain  0-No pain   Physical Examination: General exam: AAOx 3 older than stated age, weak appearing. HEENT:Oral mucosa moist, Ear/Nose WNL grossly, dentition normal. Respiratory system: bilaterally diminished, deep/pericardial drain in place no use of accessory muscle Cardiovascular system: S1 & S2 +, No JVD,. Gastrointestinal system: Abdomen soft,NT,ND, BS+ Nervous System:Alert, awake, moving extremities and grossly nonfocal Extremities: no edema, distal peripheral pulses palpable.  Skin: No rashes,no icterus. MSK: Normal muscle bulk,tone, power   Medications reviewed:  Scheduled Meds:  (feeding supplement) PROSource Plus  30 mL Oral BID BM   colchicine  0.6 mg Oral Daily   dronabinol  2.5 mg Oral BID AC   feeding supplement  237 mL Oral QID   folic acid  1 mg Oral Daily   hydrocortisone  25 mg Rectal BID   hydrocortisone-pramoxine  1 applicator Rectal BID   mometasone-formoterol  2 puff Inhalation BID   And   umeclidinium bromide  1 puff Inhalation Daily   multivitamin with minerals  1 tablet Oral Daily   pantoprazole  40 mg Oral BID   potassium chloride  20 mEq Oral Daily   psyllium  1 packet Oral Daily   thiamine  100 mg Oral  Daily   Or   thiamine  100 mg Intravenous Daily   traMADol  50 mg Oral Q6H   Continuous Infusions:  sodium chloride     cefTRIAXone (ROCEPHIN)  IV 1 g (05/22/21 2129)    Diet Order             Diet regular Room service appropriate? Yes; Fluid consistency: Thin  Diet effective now                   Nutrition  Problem: Severe Malnutrition Etiology: acute illness (newly diagnosed lung cancer) Signs/Symptoms: severe muscle depletion, severe fat depletion, percent weight loss (6% weight loss in one month) Percent weight loss: 6 % Interventions: MVI, Magic cup, Ensure Enlive (each supplement provides 350kcal and 20 grams of protein)  Intake/Output  Intake/Output Summary (Last 24 hours) at 05/23/2021 0809 Last data filed at 05/22/2021 2129 Gross per 24 hour  Intake 240 ml  Output 1 ml  Net 239 ml    Intake/Output from previous day: 10/20 0701 - 10/21 0700 In: 240 [P.O.:240] Out: 1 [Stool:1] Net IO Since Admission: [place].14 mL [05/23/21 0809]   Weight change:   Wt Readings from Last 3 Encounters:  05/18/2021 61.3 kg  05/27/2021 59.4 kg  04/24/21 60.3 kg    Consultants:see note  Procedures:see note Antimicrobials: Anti-infectives (From admission, onward)    Start     Dose/Rate Route Frequency Ordered Stop   05/28/2021 2145  cefTRIAXone (ROCEPHIN) 1 g in sodium chloride 0.9 % 100 mL IVPB        1 g 200 mL/hr over 30 Minutes Intravenous Every 24 hours 05/28/2021 2053     05/12/2021 2145  azithromycin (ZITHROMAX) 500 mg in sodium chloride 0.9 % 250 mL IVPB        500 mg 250 mL/hr over 60 Minutes Intravenous Every 24 hours 05/28/2021 2053 06/01/2021 2322   05/04/2021 1545  ceFEPIme (MAXIPIME) 2 g in sodium chloride 0.9 % 100 mL IVPB        2 g 200 mL/hr over 30 Minutes Intravenous  Once 05/11/2021 1539 05/06/2021 1640      Culture/Microbiology    Component Value Date/Time   SDES BLOOD LEFT ANTECUBITAL 05/03/2021 1604   SPECREQUEST  05/18/2021 1604    BOTTLES DRAWN AEROBIC AND ANAEROBIC Blood Culture results may not be optimal due to an inadequate volume of blood received in culture bottles   CULT  05/08/2021 1604    NO GROWTH 5 DAYS Performed at [facility], [place] [address] [place], [place] [address]    REPTSTATUS 05/21/2021 FINAL 06/01/2021 1604    Other culture-see note  Unresulted  Labs (From admission, onward)     Start     Ordered   05/22/21 1962  Basic metabolic panel  Daily,   R     Question:  Specimen collection method  Answer:  Lab=Lab collect   05/21/21 0803   05/22/21 0500  CBC  Daily,   R     Question:  Specimen collection method  Answer:  Lab=Lab collect   05/21/21 0803           Data Reviewed: I have personally reviewed following labs and imaging studies CBC: Recent Labs  Lab 05/06/2021 1341 06/01/2021 2050 05/15/2021 0430 05/26/2021 0832 05/07/2021 0231 05/21/21 0138 05/22/21 0207 05/23/21 0053  WBC 30.8*   < > 30.4*  --  35.1* 28.7* 22.0* 26.7*  NEUTROABS 26.8*  --   --   --   --   --   --   --  HGB 6.9*   < > 8.4* 8.0* 8.2* 8.0* 7.6* 8.0*  HCT 21.6*   < > 26.1* 24.3* 25.7* 26.1* 24.5* 25.2*  MCV 79.1*   < > 81.1  --  81.1 82.9 81.1 79.7*  PLT 534*   < > 455*  --  521* 570* 579* 642*   < > = values in this interval not displayed.    Basic Metabolic Panel: Recent Labs  Lab 05/23/2021 0430 05/28/2021 0231 05/21/21 0138 05/22/21 0207 05/23/21 0053  NA 135 138 138 137 138  K 3.4* 4.0 3.2* 3.4* 3.8  CL 106 108 112* 109 109  CO2 17* 19* 19* 20* 21*  GLUCOSE 101* 142* 111* 95 110*  BUN 12 12 18 12 10   CREATININE 1.27* 1.08 1.09 0.97 1.15  CALCIUM 8.4* 8.2* 8.2* 8.4* 8.6*  MG  --   --   --   --  1.5*    GFR: Estimated Creatinine Clearance: 66.6 mL/min (by C-G formula based on SCr of 1.15 mg/dL). Liver Function Tests: Recent Labs  Lab 05/23/2021 1604  AST 15  ALT 8  ALKPHOS 61  BILITOT 0.5  PROT 7.6  ALBUMIN 3.3*    Recent Labs  Lab 05/09/2021 1604  LIPASE 29    Recent Labs  Lab 05/30/2021 1604  AMMONIA <10    Coagulation Profile: Recent Labs  Lab 06/01/2021 1604  INR 1.4*    Cardiac Enzymes: No results for input(s): CKTOTAL, CKMB, CKMBINDEX, TROPONINI in the last 168 hours. BNP (last 3 results) No results for input(s): PROBNP in the last 8760 hours. HbA1C: No results for input(s): HGBA1C in the last 72 hours. CBG: No  results for input(s): GLUCAP in the last 168 hours. Lipid Profile: No results for input(s): CHOL, HDL, LDLCALC, TRIG, CHOLHDL, LDLDIRECT in the last 72 hours. Thyroid Function Tests: No results for input(s): TSH, T4TOTAL, FREET4, T3FREE, THYROIDAB in the last 72 hours. Anemia Panel: No results for input(s): VITAMINB12, FOLATE, FERRITIN, TIBC, IRON, RETICCTPCT in the last 72 hours.  Sepsis Labs: Recent Labs  Lab 05/07/2021 1604 05/21/2021 1755 05/11/2021 2054 05/26/2021 0743 05/18/21 0221  PROCALCITON  --   --  0.19 0.25 0.25  LATICACIDVEN 2.4* 2.7*  --   --   --      Recent Results (from the past 240 hour(s))  Resp Panel by RT-PCR (Flu A&B, Covid) Nasopharyngeal Swab     Status: None   Collection Time: 05/15/2021  3:38 PM   Specimen: Nasopharyngeal Swab; Nasopharyngeal(NP) swabs in vial transport medium  Result Value Ref Range Status   SARS Coronavirus 2 by RT PCR NEGATIVE NEGATIVE Final    Comment: (NOTE) SARS-CoV-2 target nucleic acids are NOT DETECTED.  The SARS-CoV-2 RNA is generally detectable in upper respiratory specimens during the acute phase of infection. The lowest concentration of SARS-CoV-2 viral copies this assay can detect is 138 copies/mL. A negative result does not preclude SARS-Cov-2 infection and should not be used as the sole basis for treatment or other patient management decisions. A negative result may occur with  improper specimen collection/handling, submission of specimen other than nasopharyngeal swab, presence of viral mutation(s) within the areas targeted by this assay, and inadequate number of viral copies(<138 copies/mL). A negative result must be combined with clinical observations, patient history, and epidemiological information. The expected result is Negative.  Fact Sheet for Patients:  EntrepreneurPulse.com.au  Fact Sheet for Healthcare Providers:  IncredibleEmployment.be  This test is no t yet approved or  cleared by the [place]  States FDA and  has been authorized for detection and/or diagnosis of SARS-CoV-2 by FDA under an Emergency Use Authorization (EUA). This EUA will remain  in effect (meaning this test can be used) for the duration of the COVID-19 declaration under Section 564(b)(1) of the Act, 21 U.S.C.section 360bbb-3(b)(1), unless the authorization is terminated  or revoked sooner.       Influenza A by PCR NEGATIVE NEGATIVE Final   Influenza B by PCR NEGATIVE NEGATIVE Final    Comment: (NOTE) The Xpert Xpress SARS-CoV-2/FLU/RSV plus assay is intended as an aid in the diagnosis of influenza from Nasopharyngeal swab specimens and should not be used as a sole basis for treatment. Nasal washings and aspirates are unacceptable for Xpert Xpress SARS-CoV-2/FLU/RSV testing.  Fact Sheet for Patients: EntrepreneurPulse.com.au  Fact Sheet for Healthcare Providers: IncredibleEmployment.be  This test is not yet approved or cleared by the [place] FDA and has been authorized for detection and/or diagnosis of SARS-CoV-2 by FDA under an Emergency Use Authorization (EUA). This EUA will remain in effect (meaning this test can be used) for the duration of the COVID-19 declaration under Section 564(b)(1) of the Act, 21 U.S.C. section 360bbb-3(b)(1), unless the authorization is terminated or revoked.  Performed at [facility], [place] [address] [place], [place] [address]   Blood culture (routine single)     Status: None   Collection Time: 05/04/2021  4:04 PM   Specimen: BLOOD  Result Value Ref Range Status   Specimen Description BLOOD LEFT ANTECUBITAL  Final   Special Requests   Final    BOTTLES DRAWN AEROBIC AND ANAEROBIC Blood Culture results may not be optimal due to an inadequate volume of blood received in culture bottles   Culture   Final    NO GROWTH 5 DAYS Performed at [facility], [place] [address] [place], [place] [address]     Report Status 05/21/2021 FINAL  Final      Radiology Studies: DG Chest 1 View  Result Date: 05/22/2021 CLINICAL DATA:  Left chest tube removal EXAM: CHEST  1 VIEW COMPARISON:  05/07/2021 FINDINGS: Single frontal view of the chest demonstrates interval removal of the left-sided chest tube and mediastinal drain. Cardiac silhouette is mildly enlarged. Persistent adenopathy within the mediastinum and right hilum, with postobstructive changes in the right lower lobe again noted. Small right pleural effusion is again seen unchanged. There is a trace left apical pneumothorax volume estimated less than 5%. Minimal left basilar hypoventilatory changes. No acute bony abnormalities. IMPRESSION: 1. Trace left apical pneumothorax after left chest tube removal, volume estimated less than 5%. 2. Persistent right lower lobe consolidation concerning for underlying malignancy based on recent CT exam. 3. Persistent mediastinal and right hilar adenopathy. Critical Value/emergent results were called by telephone at the time of interpretation on 05/22/2021 at 3:40 pm to provider [facility] , who verbally acknowledged these results. Electronically Signed   By: Randa Ngo M.D.   On: 05/22/2021 15:53     LOS: 7 days   Antonieta Pert, MD [facility]  05/23/2021, 8:09 AM

## 2021-05-23 NOTE — Progress Notes (Signed)
Per Mikey Bussing NP, I notified pathology to send pericardial tissue for molecular and PDL 1 testing to [facility].

## 2021-05-23 NOTE — Progress Notes (Signed)
OT Cancellation Note  Patient Details Name: Christopher Henry MRN: [id number] DOB: October 11, 1970   Cancelled Treatment:    Reason Eval/Treat Not Completed: Patient at procedure or test/ unavailable Pt off unit for radiation treatment. Will follow-up for OT eval as schedule permits.   [name] 05/23/2021, 1:39 PM

## 2021-05-24 DIAGNOSIS — C3490 Malignant neoplasm of unspecified part of unspecified bronchus or lung: Secondary | ICD-10-CM | POA: Diagnosis not present

## 2021-05-24 LAB — CBC
HCT: 23.9 % — ABNORMAL LOW (ref 39.0–52.0)
Hemoglobin: 7.7 g/dL — ABNORMAL LOW (ref 13.0–17.0)
MCH: 25.7 pg — ABNORMAL LOW (ref 26.0–34.0)
MCHC: 32.2 g/dL (ref 30.0–36.0)
MCV: 79.7 fL — ABNORMAL LOW (ref 80.0–100.0)
Platelets: 644 10*3/uL — ABNORMAL HIGH (ref 150–400)
RBC: 3 MIL/uL — ABNORMAL LOW (ref 4.22–5.81)
RDW: 19.1 % — ABNORMAL HIGH (ref 11.5–15.5)
WBC: 28.5 10*3/uL — ABNORMAL HIGH (ref 4.0–10.5)
nRBC: 0 % (ref 0.0–0.2)

## 2021-05-24 LAB — BASIC METABOLIC PANEL
Anion gap: 8 (ref 5–15)
BUN: 9 mg/dL (ref 6–20)
CO2: 21 mmol/L — ABNORMAL LOW (ref 22–32)
Calcium: 8.5 mg/dL — ABNORMAL LOW (ref 8.9–10.3)
Chloride: 106 mmol/L (ref 98–111)
Creatinine, Ser: 1 mg/dL (ref 0.61–1.24)
GFR, Estimated: >60 mL/min (ref >60)
Glucose, Bld: 95 mg/dL (ref 70–99)
Potassium: 3.9 mmol/L (ref 3.5–5.1)
Sodium: 135 mmol/L (ref 135–145)

## 2021-05-24 LAB — PROCALCITONIN: Procalcitonin: <0.1 ng/mL

## 2021-05-24 MED ORDER — FLUCONAZOLE 100 MG PO TABS
200.0000 mg | ORAL_TABLET | Freq: Every day | ORAL | Status: DC
  Administered 2021-05-25 – 2021-06-05 (×11): 200 mg via ORAL
  Filled 2021-05-24 (×13): qty 2

## 2021-05-24 MED ORDER — COLCHICINE 0.3 MG HALF TABLET
0.3000 mg | ORAL_TABLET | Freq: Every day | ORAL | Status: DC
  Administered 2021-05-24 – 2021-06-05 (×12): 0.3 mg via ORAL
  Filled 2021-05-24 (×14): qty 1

## 2021-05-24 MED ORDER — IOHEXOL 9 MG/ML PO SOLN
500.0000 mL | ORAL | Status: AC
Start: 2021-05-24 — End: 2021-05-25

## 2021-05-24 MED ORDER — FLUCONAZOLE 100 MG PO TABS
400.0000 mg | ORAL_TABLET | Freq: Once | ORAL | Status: AC
  Administered 2021-05-24: 400 mg via ORAL
  Filled 2021-05-24: qty 4

## 2021-05-24 NOTE — Progress Notes (Signed)
PROGRESS NOTE    [name]  SNK:[id number] DOB: [birth year] DOA: 05/24/2021 PCP: Bary Castilla, NP   Chief Complaint  Patient presents with   Shortness of Breath    Brief Narrative/Hospital Course:  Christopher Henry, 50 y.o. male with PMH of COPD, HTN, alcohol abuse, tobacco use 1 pack/day x 35 years sent from pulmonary office to ED for acute respiratory distress hypoxic 80% on room air, nonproductive cough and hematemesis over the past 2 months, burning sensation on the chest and greater than 50 pound weight loss x12 months. In the ED, anemic hemoglobin 6.9 g, leukocytosis, D-dimer 3.5, initial troponin 49 lactic acidosis 2.4 creatinine 1.5 chest x-ray RLL pneumonia. CT 18/14 no PE, infiltrative mass of the mediastinum, complete collapse of the right lower lobe with associated complete right lower lobe bronchi occlusion due to endobronchial debris/lesion,?  Mass extension into the carina, left to right midline shift due to decreased lung volume-concern for malignancy likely of the mediastinum with extension into or around the right mainstem bronchi leading to collapse of the right lower lobe, mediastinal lymphadenopathy, 1.7 cm groundglass opacity right apex, small to moderate volume pericardial effusion, subacute to chronic fracture of the medial sternal body. 10/15 underwent bronchoscopy-showed large subcarinal mass indenting the carina and bulging and bilaterally with large mainstem lesion obscuring 50% of lumen, biopsies taken. Large pericardial effusion noted in the echo, cardiology and thoracic surgery following S/p left anterior thoracotomy with drainage of pericardial effusion -open pericardial window by [name] 10/17.  10/18-underwent EGD colonoscopy found to have prolapsed grade 4 rectal hemorrhoids, and patient also started on radiotherapy  Subjective: Seen examined this morning.  Feeling depressed.  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 biopsy showed atypical cells.Rad Onc has seen  and radiotherapy started 10/19 urgently.He is a scheduled to complete radiation treatment 07/08/2021.  Medical oncology input appreciated started initial staging with MRI of the brain with and without-that was normal, pending CT abdomen pelvis, biopsy of pericardium to be sent for PD-L1 and foundation 1 studies.  Malignant large pericardial effusion: S/p open pericardial window by [name] 10/17.  Drain removed 10/20 and chest x-ray no pneumothorax.  Pericardial biopsy showed metastatic carcinoma.  Continue colchicine.  Pain is controlled  Acute hypoxemic respiratory failure - was hypoxic 80% on room air with respiratory distress on admission 2/2 pneumonia/mediastinal mass. Currently on room air.    Sepsis secondary to pneumonia RLL post obstructive pneumonia: In the setting of malignancy.  Completed azithromycin, on ceftriaxone.  Seen by pulmonary, at this time afebrile although leukocytosis worsening, procalcitonin rechecked and negative/reassuring-suspect component of reactive leukocytosis with ongoing radiation, postobstructive issues.  Can change to Augmentin on discharge.   Recent Labs  Lab 05/18/21 0221 05/14/2021 0430 05/07/2021 0231 05/21/21 0138 05/22/21 0207 05/23/21 0053 05/24/21 0208  WBC 25.5*   < > 35.1* 28.7* 22.0* 26.7* 28.5*  PROCALCITON 0.25  --   --   --   --  <0.10 <0.10   < > = values in this interval not displayed.   Hematochezia/melena Acute blood loss anemia in the setting of painless rectal bleeding due to hemorrhoids Anemia of  chronic disease as well S/P multiple PRBC  transfusions.  Stable  now-Transfuse if less than 7 g.  S/p EGD colonoscopy 10/18-mild esophagitis-positive for Candida esophagitis- large grade 4 prolapsed hemorrhoids-general surgery consulted and advised medical and topical care no plan for surgery at this time- w/ H prep, sitz bath (patient refusing bath).  Transfuse to keep hemoglobin above 7.5 g may need additional transfusion prior to discharge. Recent Labs  Lab 05/04/2021 0231 05/21/21 0138 05/22/21 0207 05/23/21 0053 05/24/21 0208  HGB 8.2* 8.0* 7.6* 8.0* 7.7*  HCT 25.7* 26.1* 24.5* 25.2* 23.9*    Candida esophagitis: Start fluconazole   Hypomagnesemia repleted  AKI in the setting of dehydration/pneumonia/malignancy.  Resolved Anion gap metabolic acidosis due to AKI: By 12/09/2019.  R Hypokalemia : Resolved  Possible COPD: Currently not in exacerbation.Continue Dulera,Incruse Ellipta and bronchodilators as needed  Severe protein calorie moderation with significant weight loss and BMI 16.45.  In the setting of malignancy overall poor prognostic indicator.  History of alcohol use/tobacco use patient reports he recently quit.  Continue thiamine folic acid multivitamins and watch for withdrawals.  Deconditioning: PT OT eval requested  Severe malnutrition augment diet as below added Marinol. Nutrition Problem: Severe Malnutrition Etiology: acute illness (newly diagnosed lung cancer) Signs/Symptoms: severe muscle depletion, severe fat depletion, percent weight loss (6% weight loss in one month) Percent weight loss: 6 % Interventions: MVI, Magic cup, Ensure Enlive (each supplement provides 350kcal and 20 grams of protein)    DVT prophylaxis: SCDs Start: 06/01/2021 2105.  No chemeical prophylaxis due to anemia Code Status:   Code Status: Full Code Family Communication: plan of care discussed with patient his fiance and the mother at bedside. Overall prognosis guarded/poor  Status is:  Inpatient Remains inpatient appropriate because: Ongoing management of anemia, pericardial effusion, lung mass pneumonia. Anticipate discharge 1-2 days once staging completed okay with radio-onc/oncology  Objective: Vitals: Today's Vitals   05/23/21 2057 05/23/21 2100 05/23/21 2318 05/24/21 0353  BP: 129/80  127/72 118/74  Pulse: (!) 107  (!) 105 (!) 104  Resp: '19  19 20  ' Temp: 97.9 F (36.6 C)  98.2 F (36.8 C) 98 F (36.7 C)  TempSrc:   Oral Oral  SpO2:   93% 96%  Weight:      PainSc:  0-No pain     Physical Examination: General exam: AAOx 3, low mood HEENT:Oral mucosa moist, Ear/Nose WNL grossly, dentition normal. Respiratory system: bilaterally diminished, , no use of accessory muscle Cardiovascular system: S1 & S2 +, No JVD,. Gastrointestinal system: Abdomen soft,NT,ND, BS+ Nervous System:Alert, awake, moving extremities and grossly nonfocal Extremities: No edema, distal peripheral pulses palpable.  Skin: No rashes,no icterus. MSK: Normal muscle bulk,tone, power    Medications reviewed:  Scheduled Meds:  (feeding supplement) PROSource Plus  30 mL Oral BID BM   colchicine  0.6 mg Oral Daily   dronabinol  2.5 mg Oral BID AC   feeding supplement  237 mL Oral QID   folic acid  1 mg Oral Daily   hydrocortisone  25 mg Rectal BID   hydrocortisone-pramoxine  1 applicator Rectal BID   mometasone-formoterol  2 puff Inhalation BID   And   umeclidinium bromide  1 puff Inhalation Daily   multivitamin with minerals  1 tablet Oral Daily   pantoprazole  40 mg Oral BID   potassium chloride  20 mEq Oral Daily   psyllium  1 packet Oral Daily   thiamine  100 mg Oral Daily   Or   thiamine  100 mg Intravenous Daily  traMADol  50 mg Oral Q6H   Continuous Infusions:  sodium chloride     cefTRIAXone (ROCEPHIN)  IV 1 g (05/23/21 2201)    Diet Order             Diet regular Room service appropriate? Yes; Fluid consistency: Thin  Diet effective now                    Nutrition Problem: Severe Malnutrition Etiology: acute illness (newly diagnosed lung cancer) Signs/Symptoms: severe muscle depletion, severe fat depletion, percent weight loss (6% weight loss in one month) Percent weight loss: 6 % Interventions: MVI, Magic cup, Ensure Enlive (each supplement provides 350kcal and 20 grams of protein)  Intake/Output  Intake/Output Summary (Last 24 hours) at 05/24/2021 0751 Last data filed at 05/23/2021 1700 Gross per 24 hour  Intake 0 ml  Output 1201 ml  Net -1201 ml    Intake/Output from previous day: 10/21 0701 - 10/22 0700 In: 0  Out: 1201 [Urine:1200; Emesis/NG output:1] Net IO Since Admission: 4,263.14 mL [05/24/21 0751]   Weight change:   Wt Readings from Last 3 Encounters:  05/30/2021 61.3 kg  05/22/2021 59.4 kg  04/24/21 60.3 kg    Consultants:see note  Procedures:see note Antimicrobials: Anti-infectives (From admission, onward)    Start     Dose/Rate Route Frequency Ordered Stop   05/21/2021 2145  cefTRIAXone (ROCEPHIN) 1 g in sodium chloride 0.9 % 100 mL IVPB        1 g 200 mL/hr over 30 Minutes Intravenous Every 24 hours 05/22/2021 2053 05/26/21 2144   05/21/2021 2145  azithromycin (ZITHROMAX) 500 mg in sodium chloride 0.9 % 250 mL IVPB        500 mg 250 mL/hr over 60 Minutes Intravenous Every 24 hours 05/09/2021 2053 05/18/2021 2322   05/15/2021 1545  ceFEPIme (MAXIPIME) 2 g in sodium chloride 0.9 % 100 mL IVPB        2 g 200 mL/hr over 30 Minutes Intravenous  Once 05/11/2021 1539 05/05/2021 1640      Culture/Microbiology    Component Value Date/Time   SDES BLOOD LEFT ANTECUBITAL 05/11/2021 1604   SPECREQUEST  05/28/2021 1604    BOTTLES DRAWN AEROBIC AND ANAEROBIC Blood Culture results may not be optimal due to an inadequate volume of blood received in culture bottles   CULT  05/14/2021 1604    NO GROWTH 5 DAYS Performed at [facility], [place] [address], [place], [place] [address]    REPTSTATUS 05/21/2021 FINAL 05/15/2021 1604     Other culture-see note  Unresulted Labs (From admission, onward)     Start     Ordered   05/24/21 0500  Procalcitonin  Daily,   R     Question:  Specimen collection method  Answer:  Lab=Lab collect   05/23/21 1113           Data Reviewed: I have personally reviewed following labs and imaging studies CBC: Recent Labs  Lab 05/26/2021 0231 05/21/21 0138 05/22/21 0207 05/23/21 0053 05/24/21 0208  WBC 35.1* 28.7* 22.0* 26.7* 28.5*  HGB 8.2* 8.0* 7.6* 8.0* 7.7*  HCT 25.7* 26.1* 24.5* 25.2* 23.9*  MCV 81.1 82.9 81.1 79.7* 79.7*  PLT 521* 570* 579* 642* 644*    Basic Metabolic Panel: Recent Labs  Lab 05/24/2021 0231 05/21/21 0138 05/22/21 0207 05/23/21 0053 05/24/21 0208  NA 138 138 137 138 135  K 4.0 3.2* 3.4* 3.8 3.9  CL 108 112* 109 109 106  CO2  19* 19* 20* 21* 21*  GLUCOSE 142* 111* 95 110* 95  BUN '12 18 12 10 9  ' CREATININE 1.08 1.09 0.97 1.15 1.00  CALCIUM 8.2* 8.2* 8.4* 8.6* 8.5*  MG  --   --   --  1.5*  --     GFR: Estimated Creatinine Clearance: 76.6 mL/min (by C-G formula based on SCr of 1 mg/dL). Liver Function Tests: No results for input(s): AST, ALT, ALKPHOS, BILITOT, PROT, ALBUMIN in the last 168 hours.  No results for input(s): LIPASE, AMYLASE in the last 168 hours.  No results for input(s): AMMONIA in the last 168 hours.  Coagulation Profile: No results for input(s): INR, PROTIME in the last 168 hours.  Cardiac Enzymes: No results for input(s): CKTOTAL, CKMB, CKMBINDEX, TROPONINI in the last 168 hours. BNP (last 3 results) No results for input(s): PROBNP in the last 8760 hours. HbA1C: No results for input(s): HGBA1C in the last 72 hours. CBG: No results for input(s): GLUCAP in the last 168 hours. Lipid Profile: No results for input(s): CHOL, HDL, LDLCALC, TRIG, CHOLHDL, LDLDIRECT in the last 72 hours. Thyroid Function Tests: No results for input(s): TSH, T4TOTAL, FREET4, T3FREE, THYROIDAB in the last 72 hours. Anemia Panel: No results for  input(s): VITAMINB12, FOLATE, FERRITIN, TIBC, IRON, RETICCTPCT in the last 72 hours.  Sepsis Labs: Recent Labs  Lab 05/18/21 0221 05/23/21 0053 05/24/21 0208  PROCALCITON 0.25 <0.10 <0.10     Recent Results (from the past 240 hour(s))  Resp Panel by RT-PCR (Flu A&B, Covid) Nasopharyngeal Swab     Status: None   Collection Time: 06/01/2021  3:38 PM   Specimen: Nasopharyngeal Swab; Nasopharyngeal(NP) swabs in vial transport medium  Result Value Ref Range Status   SARS Coronavirus 2 by RT PCR NEGATIVE NEGATIVE Final    Comment: (NOTE) SARS-CoV-2 target nucleic acids are NOT DETECTED.  The SARS-CoV-2 RNA is generally detectable in upper respiratory specimens during the acute phase of infection. The lowest concentration of SARS-CoV-2 viral copies this assay can detect is 138 copies/mL. A negative result does not preclude SARS-Cov-2 infection and should not be used as the sole basis for treatment or other patient management decisions. A negative result may occur with  improper specimen collection/handling, submission of specimen other than nasopharyngeal swab, presence of viral mutation(s) within the areas targeted by this assay, and inadequate number of viral copies(<138 copies/mL). A negative result must be combined with clinical observations, patient history, and epidemiological information. The expected result is Negative.  Fact Sheet for Patients:  EntrepreneurPulse.com.au  Fact Sheet for Healthcare Providers:  IncredibleEmployment.be  This test is no t yet approved or cleared by the [place] FDA and  has been authorized for detection and/or diagnosis of SARS-CoV-2 by FDA under an Emergency Use Authorization (EUA). This EUA will remain  in effect (meaning this test can be used) for the duration of the COVID-19 declaration under Section 564(b)(1) of the Act, 21 U.S.C.section 360bbb-3(b)(1), unless the authorization is terminated  or  revoked sooner.       Influenza A by PCR NEGATIVE NEGATIVE Final   Influenza B by PCR NEGATIVE NEGATIVE Final    Comment: (NOTE) The Xpert Xpress SARS-CoV-2/FLU/RSV plus assay is intended as an aid in the diagnosis of influenza from Nasopharyngeal swab specimens and should not be used as a sole basis for treatment. Nasal washings and aspirates are unacceptable for Xpert Xpress SARS-CoV-2/FLU/RSV testing.  Fact Sheet for Patients: EntrepreneurPulse.com.au  Fact Sheet for Healthcare Providers: IncredibleEmployment.be  This test  is not yet approved or cleared by the [place] and has been authorized for detection and/or diagnosis of SARS-CoV-2 by FDA under an Emergency Use Authorization (EUA). This EUA will remain in effect (meaning this test can be used) for the duration of the COVID-19 declaration under Section 564(b)(1) of the Act, 21 U.S.C. section 360bbb-3(b)(1), unless the authorization is terminated or revoked.  Performed at [facility], [place] [address] [place], [place] [address]   Blood culture (routine single)     Status: None   Collection Time: 05/27/2021  4:04 PM   Specimen: BLOOD  Result Value Ref Range Status   Specimen Description BLOOD LEFT ANTECUBITAL  Final   Special Requests   Final    BOTTLES DRAWN AEROBIC AND ANAEROBIC Blood Culture results may not be optimal due to an inadequate volume of blood received in culture bottles   Culture   Final    NO GROWTH 5 DAYS Performed at [facility], [place] [address], [place], [place] [address]    Report Status 05/21/2021 FINAL  Final      Radiology Studies: DG Chest 1 View  Result Date: 05/22/2021 CLINICAL DATA:  Left chest tube removal EXAM: CHEST  1 VIEW COMPARISON:  05/05/2021 FINDINGS: Single frontal view of the chest demonstrates interval removal of the left-sided chest tube and mediastinal drain. Cardiac silhouette is mildly enlarged. Persistent adenopathy  within the mediastinum and right hilum, with postobstructive changes in the right lower lobe again noted. Small right pleural effusion is again seen unchanged. There is a trace left apical pneumothorax volume estimated less than 5%. Minimal left basilar hypoventilatory changes. No acute bony abnormalities. IMPRESSION: 1. Trace left apical pneumothorax after left chest tube removal, volume estimated less than 5%. 2. Persistent right lower lobe consolidation concerning for underlying malignancy based on recent CT exam. 3. Persistent mediastinal and right hilar adenopathy. Critical Value/emergent results were called by telephone at the time of interpretation on 05/22/2021 at 3:40 pm to provider [facility] , who verbally acknowledged these results. Electronically Signed   By: Randa Ngo M.D.   On: 05/22/2021 15:53   MR BRAIN W WO CONTRAST  Result Date: 05/23/2021 CLINICAL DATA:  Non-small cell lung cancer.  Staging. EXAM: MRI HEAD WITHOUT AND WITH CONTRAST TECHNIQUE: Multiplanar, multiecho pulse sequences of the brain and surrounding structures were obtained without and with intravenous contrast. CONTRAST:  6.11m GADAVIST GADOBUTROL 1 MMOL/ML IV SOLN COMPARISON:  None. FINDINGS: Brain: The brain has a normal appearance without evidence of malformation, atrophy, old or acute small or large vessel infarction, mass lesion, hemorrhage, hydrocephalus or extra-axial collection. Few punctate foci of nonenhancing T2 signal in the frontal white matter, often seen in healthy individuals and not likely significant. Vascular: Major vessels at the base of the brain show flow. Venous sinuses appear patent. Skull and upper cervical spine: Normal. Sinuses/Orbits: Clear/normal. Other: None significant. IMPRESSION: Normal MRI of the brain.  No evidence of metastatic disease. Electronically Signed   By: MNelson ChimesM.D.   On: 05/23/2021 18:35     LOS: 8 days   RAntonieta Pert MD [facility]  05/24/2021, 7:51 AM

## 2021-05-24 NOTE — Progress Notes (Signed)
Dr Emily Filbert rounding note reviewed. He is s/p pericardial window for malignanct pericardial effusion, drain removed by CT surgery. Ongoing management for stage IV nonsmall cell lung CAD, sepsis/pneumonia, hematochexia and bleeding hemorroids by primary team and associated specialists.  No additional recs over the weekend, please call with questions.   Carlyle Dolly MD

## 2021-05-24 NOTE — Evaluation (Signed)
Physical Therapy Evaluation Patient Details Name: Christopher Henry MRN: [id number] DOB: 19-Jan-1971 Today's Date: 05/24/2021  History of Present Illness  The pt is a 50 yo male presenting 10/14 due to SOB with hypoxia to 80% with short distances of ambulation. Pt also reports 50lb wt loss over last year and hemoptysis x2 in last month but increased to daily in last week. Pt found to have pericardial effusion in the setting of locally advanced lung cancer as well as prolapsed grade IV hemorrhoids. s/p EGD on 10/18, initiation of radiation on 10/18. PMH includes: tobacco use (x35 years) and HTN.   Clinical Impression  Pt in bed upon arrival of PT, agreeable to evaluation at this time. The pt's significant other provided most PLOF due to pt reporting that any conversation makes him short of breath at this time.Per family, the pt has been significantly short of breath and fatiging quickly since 10/9, limited to short distance ambulation in the home. The pt states he was completing ADLs, but with increased time due to fatigue and SOB. The pt now presents with limitations in functional mobility, strength, power, stability, and activity tolerance due to above dx and deconditioning, and will continue to benefit from skilled PT to address these deficits. The pt was able to complete sit-stand and stand-pivot transfers with limited assist, but required significant seated rest to recover and HR maintained 135-142bpm with any upright mobility (did return to 115bpm after returned to supine). The pt will benefit from continued skilled therapies to maintain functional strength while improving endurance and power to improve pt independence and quality of life.         Recommendations for follow up therapy are one component of a multi-disciplinary discharge planning process, led by the attending physician.  Recommendations may be updated based on patient status, additional functional criteria and insurance  authorization.  Follow Up Recommendations Home health PT;Supervision for mobility/OOB    Equipment Recommendations  Rolling walker with 5" wheels;Wheelchair (measurements PT);Wheelchair cushion (measurements PT);3in1 (PT)    Recommendations for Other Services       Precautions / Restrictions Precautions Precautions: Fall Restrictions Weight Bearing Restrictions: No      Mobility  Bed Mobility Overal bed mobility: Modified Independent             General bed mobility comments: increased time and effort, no assiist    Transfers Overall transfer level: Needs assistance Equipment used: None Transfers: Sit to/from [employer] Sit to Stand: Min guard   Squat pivot transfers: Min guard     General transfer comment: sit-stand and stand-pivot with minG for safety, pt stating he is unable to maintain standing and therefore required BUE support on bed but no DME used. pt with good use of BSC armrests to power up  Ambulation/Gait             General Gait Details: pt declined due to fatigue      Balance Overall balance assessment: Needs assistance Sitting-balance support: No upper extremity supported;Feet supported Sitting balance-Leahy Scale: Good     Standing balance support: Bilateral upper extremity supported;During functional activity Standing balance-Leahy Scale: Poor Standing balance comment: significant strength deficits                             Pertinent Vitals/Pain Pain Assessment: Faces Faces Pain Scale: Hurts whole lot Pain Location: chest, bottom Pain Descriptors / Indicators: Discomfort;Grimacing;Moaning Pain Intervention(s): Limited activity within patient's tolerance;Premedicated before session;Monitored  during session;Repositioned    Home Living Family/patient expects to be discharged to:: Private residence Living Arrangements: Spouse/significant other Available Help at Discharge: Family;Available  PRN/intermittently (significant other works 12 hours/day and has no one else available to assist pt while she is at work) Type of Home: House Home Access: Stairs to enter Entrance Stairs-Rails: Can reach both [employer] of Steps: 3-5 Home Layout: One [place]: None      Prior Function Level of Independence: Independent         Comments: independent but fatigued quickly with any activity, significantly worse since 10/9.     Hand Dominance   Dominant Hand: Right    Extremity/Trunk Assessment   Upper Extremity Assessment Upper Extremity Assessment: Generalized weakness    Lower Extremity Assessment Lower Extremity Assessment: Generalized weakness (very poor activity tolerance with any movement)    Cervical / Trunk Assessment Cervical / Trunk Assessment: Kyphotic  Communication   Communication: Other (comment) (limited by SOB with talking or movement. pt making hand gestures or one word answers for entire session)  Cognition Arousal/Alertness: Awake/alert Behavior During Therapy: Flat affect Overall Cognitive Status: Within Functional Limits for tasks assessed                                 General Comments: pt with somber and depressed affect, making statements such as "I am just going to die home alone" when therapist asking about home set up and assist available. RN notified and putting in consult for chaplain.      General Comments General comments (skin integrity, edema, etc.): HR 135-141bpm with any activity, returned to 115 bpm at rest after session. pt continues to decline taking meds left by RN, significant other present and very supportive, consult to talk to chaplain arranged        Assessment/Plan    PT Assessment Patient needs continued PT services  PT Problem List Decreased strength;Decreased range of motion;Decreased activity tolerance;Decreased balance;Decreased mobility;Decreased cognition;Decreased safety  awareness;Pain       PT Treatment Interventions DME instruction;Gait training;Stair training;Functional mobility training;Therapeutic exercise;Therapeutic activities;Balance training;Patient/family education    PT Goals (Current goals can be found in the Care Plan section)  Acute Rehab PT Goals Patient Stated Goal: reduce pain PT Goal Formulation: With patient/family Time For Goal Achievement: 06/07/21 Potential to Achieve Goals: Poor    Frequency Min 3X/week   Barriers to discharge Decreased caregiver support significant other works 12 hours/day and has no one else available to assist pt while she is at work       AM-PAC PT "[place]" Mobility  Outcome Measure Help needed turning from your back to your side while in a flat bed without using bedrails?: None Help needed moving from lying on your back to sitting on the side of a flat bed without using bedrails?: None Help needed moving to and from a bed to a chair (including a wheelchair)?: A Little Help needed standing up from a chair using your arms (e.g., wheelchair or bedside chair)?: A Little Help needed to walk in hospital room?: A Little Help needed climbing 3-5 steps with a railing? : A Lot 6 Click Score: 19    End of Session   Activity Tolerance: Patient limited by fatigue;Patient limited by pain Patient left: in bed;with call bell/phone within reach;with family/visitor present Nurse Communication: Mobility status PT Visit Diagnosis: Other abnormalities of gait and mobility (R26.89);Muscle weakness (generalized) (M62.81);Pain;Unsteadiness on  feet (R26.81)    Time: 1018-1100 PT Time Calculation (min) (ACUTE ONLY): 42 min   Charges:   PT Evaluation $PT Eval Moderate Complexity: 1 Mod PT Treatments $Therapeutic Activity: 23-37 mins        [place], PT, DPT   Acute Rehabilitation Department Pager #: [phone]  Sandra Cockayne 05/24/2021, 11:14 AM

## 2021-05-24 NOTE — Progress Notes (Signed)
Pharmacy Antibiotic Note  Christopher Henry is a 50 y.o. male admitted on 05/15/2021 with  esophageal candidiasis .  Pharmacy has been consulted for fluconazole dosing.  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 Fluconazole can increase the serum concentration of colchicine, so colchicine has been empirically decreased from 0.6 mg daily to 0.3 mg daily to avoid toxicity.   Plan: Fluconazole 400 mg PO x1 followed by fluconazole 200 mg PO daily x14 days Monitor for signs of clinical improvement  Weight: 61.3 kg (135 lb 2.3 oz)  Temp (24hrs), Avg:98.2 F (36.8 C), Min:97.9 F (36.6 C), Max:98.9 F (37.2 C)  Recent Labs  Lab 05/30/2021 0231 05/21/21 0138 05/22/21 0207 05/23/21 0053 05/24/21 0208  WBC 35.1* 28.7* 22.0* 26.7* 28.5*  CREATININE 1.08 1.09 0.97 1.15 1.00    Estimated Creatinine Clearance: 76.6 mL/min (by C-G formula based on SCr of 1 mg/dL).    No Known Allergies  Antimicrobials this admission: IV azithromycin 10/14>>10/19 Ceftriaxone 10/14>>(10/23) Fluconazole 10/22>>(11/4)  Microbiology results: 10/14 Bcx - negF  Thank you for allowing pharmacy to be a part of this patient's care.  Zenaida Deed, PharmD PGY1 Acute Care Pharmacy Resident  Phone: [phone] 05/24/2021  8:53 AM  Please check [email or web address] for unit-specific pharmacy phone numbers.

## 2021-05-24 NOTE — Evaluation (Signed)
Occupational Therapy Evaluation Patient Details Name: Christopher Henry MRN: [id number] DOB: February 14, 1971 Today's Date: 05/24/2021   History of Present Illness The pt is a 50 yo male presenting 10/14 due to SOB with hypoxia to 80% with short distances of ambulation. Pt also reports 50lb wt loss over last year and hemoptysis x2 in last month but increased to daily in last week. Pt found to have pericardial effusion in the setting of locally advanced lung cancer as well as prolapsed grade IV hemorrhoids. s/p EGD on 10/18, initiation of radiation on 10/18. PMH includes: tobacco use (x35 years) and HTN.   Clinical Impression   Patient admitted for the diagnosis above.  PTA the patient lives at home with his SO, who works, and is only able to provide PRN assist.  Patient admits to declining activity tolerance and increasing SOB impacting ADL and mobility performance.  Patient appears apathetic, but would benefit from energy conservation education, DME recommendations, parts of the hip kit, upper body HEP, and continued engagement in ADL and in room mobility.  OT can trial a few visits to see if the patient participates, but may need to be discharge off program due to potential refusals.  HH OT can be recommended, but I'm not sure he will agree.       Recommendations for follow up therapy are one component of a multi-disciplinary discharge planning process, led by the attending physician.  Recommendations may be updated based on patient status, additional functional criteria and insurance authorization.   Follow Up Recommendations  Home health OT    Equipment Recommendations  3 in 1 bedside commode;Tub/shower bench;Other (comment) (hip kit as needed)    Recommendations for Other Services       Precautions / Restrictions Precautions Precautions: Fall Restrictions Weight Bearing Restrictions: No      Mobility Bed Mobility Overal bed mobility: Needs Assistance Bed Mobility: Supine to Sit;Sit to  Supine     Supine to sit: Supervision Sit to supine: Supervision   General bed mobility comments: poor activity tolerance Patient Response: Flat affect  Transfers                      Balance Overall balance assessment: Needs assistance Sitting-balance support: Feet supported Sitting balance-Leahy Scale: Good                                     ADL either performed or assessed with clinical judgement   ADL       Grooming: Wash/dry hands;Wash/dry face;Min guard;Sitting       Lower Body Bathing: Minimal assistance;Sitting/lateral leans       Lower Body Dressing: Minimal assistance;Sitting/lateral leans   Toilet Transfer: Minimal assistance;RW;Stand-pivot   Toileting- Clothing Manipulation and Hygiene: Moderate assistance;Sit to/from stand         General ADL Comments: SOB and poor acttivity tolerance.     Vision Patient Visual Report: No change from baseline                  Pertinent Vitals/Pain Faces Pain Scale: Hurts even more Pain Location: R ankle, L shoulder and buttlcks Pain Descriptors / Indicators: Discomfort;Grimacing;Moaning Pain Intervention(s): Monitored during session     Hand Dominance Right   Extremity/Trunk Assessment Upper Extremity Assessment Upper Extremity Assessment: Overall WFL for tasks assessed (Weakness for his age, but able to use his UE functionally)   Lower Extremity Assessment Lower Extremity  Assessment: Defer to PT evaluation   Cervical / Trunk Assessment Cervical / Trunk Assessment: Kyphotic   Communication Communication Communication: No difficulties   Cognition Arousal/Alertness: Awake/alert Behavior During Therapy: Flat affect Overall Cognitive Status: Within Functional Limits for tasks assessed                                 General Comments: patient with general apathy toward OT assessment.  Eyes closed during and would nod yes/no and shruge his shoulders at other  times.    declined OOB t [place] expects to be discharged to:: Private residence Living Arrangements: Spouse/significant other Available Help at Discharge: Family;Available PRN/intermittently Type of Home: House Home Access: Stairs to enter [employer] of Steps: 3-5 Entrance Stairs-Rails: Can reach both Home Layout: One level     Bathroom Shower/Tub: Teacher, early years/pre: Standard     Home Equipment: None          Prior Functioning/Environment Level of Independence: Independent        Comments: Independent, with decling activity tolerance and needing increased time and effort.        OT Problem List: Decreased strength;Decreased activity tolerance;Impaired balance (sitting and/or standing);Decreased knowledge of use of DME or AE;Pain      OT Treatment/Interventions: Self-care/ADL training;Therapeutic exercise;Therapeutic activities;Balance training;DME and/or AE instruction;Patient/family education;Energy conservation    OT Goals(Current goals can be found in the care plan section) Acute Rehab OT Goals Patient Stated Goal: None stated OT Goal Formulation: With patient Time For Goal Achievement: 06/07/21 Potential to Achieve Goals: Fair ADL Goals Pt Will Perform Grooming: sitting;with modified independence Pt Will Perform Lower Body Bathing: with modified independence;sit to/from stand Pt Will Perform Lower Body Dressing: with modified independence;sit to/from stand;with adaptive equipment Pt Will Transfer to Toilet: with modified independence;ambulating;regular height toilet Pt Will Perform Toileting - Clothing Manipulation and hygiene: with modified independence;sitting/lateral leans  OT Frequency: Min 2X/week   Barriers to D/C: Decreased caregiver support          Co-evaluation              AM-PAC OT "6 Clicks" Daily Activity     Outcome Measure Help from another person eating  meals?: A Little Help from another person taking care of personal grooming?: A Little Help from another person toileting, which includes using toliet, bedpan, or urinal?: A Little Help from another person bathing (including washing, rinsing, drying)?: A Little Help from another person to put on and taking off regular upper body clothing?: A Little Help from another person to put on and taking off regular lower body clothing?: A Little 6 Click Score: 18   End of Session Equipment Utilized During Treatment: Rolling walker  Activity Tolerance: Patient limited by fatigue Patient left: in bed;with call bell/phone within reach;with family/visitor present  OT Visit Diagnosis: Unsteadiness on feet (R26.81);Muscle weakness (generalized) (M62.81);Pain Pain - Right/Left: Left Pain - part of body: Shoulder                Time: 8099-8338 OT Time Calculation (min): 17 min Charges:  OT General Charges $OT Visit: 1 Visit OT Evaluation $OT Eval Moderate Complexity: 1 Mod  05/24/2021  RP, OTR/L  Acute Rehabilitation Services  Office:  [phone]   Metta Clines 05/24/2021, 3:31 PM

## 2021-05-24 NOTE — TOC CAGE-AID Note (Signed)
Transition of Care [facility]) - CAGE-AID Screening   Patient Details  Name: Johntay Doolen MRN: [id number] Date of Birth: Jul 19, 1971  Transition of Care [facility]) CM/SW Contact:    Emeterio Reeve, LCSW Phone Number: 05/24/2021, 12:40 PM   Clinical Narrative:  CSW m3t with pt and fiance at bedside. Pt reports he drinks daily. Pt did not state how much he drinks daily. When CSW asked if pt felt that he should stop of cut back his alcohol use pt just shrugged shoulders. Pts fiance stated she has encouraged pt to cut back alcohol use many times. CSW gave resources for pt.   CAGE-AID Screening:    Have You Ever Felt You Ought to Cut Down on Your Drinking or Drug Use?: No Have People Annoyed You By Critizing Your Drinking Or Drug Use?: Yes Have You Felt Bad Or Guilty About Your Drinking Or Drug Use?: No Have You Ever Had a Drink or Used Drugs First Thing In The Morning to Steady Your Nerves or to Get Rid of a Hangover?: No CAGE-AID Score: 1  Substance Abuse Education Offered: Yes  Substance abuse interventions: Patient and Family Counseling, Educational Materials   Emeterio Reeve, [place] Clinical Social Worker

## 2021-05-25 ENCOUNTER — Inpatient Hospital Stay (HOSPITAL_COMMUNITY): Payer: 59

## 2021-05-25 DIAGNOSIS — C3490 Malignant neoplasm of unspecified part of unspecified bronchus or lung: Secondary | ICD-10-CM | POA: Diagnosis not present

## 2021-05-25 LAB — CBC
HCT: 26.8 % — ABNORMAL LOW (ref 39.0–52.0)
Hemoglobin: 8.4 g/dL — ABNORMAL LOW (ref 13.0–17.0)
MCH: 24.9 pg — ABNORMAL LOW (ref 26.0–34.0)
MCHC: 31.3 g/dL (ref 30.0–36.0)
MCV: 79.5 fL — ABNORMAL LOW (ref 80.0–100.0)
Platelets: 775 10*3/uL — ABNORMAL HIGH (ref 150–400)
RBC: 3.37 MIL/uL — ABNORMAL LOW (ref 4.22–5.81)
RDW: 19.4 % — ABNORMAL HIGH (ref 11.5–15.5)
WBC: 36.8 10*3/uL — ABNORMAL HIGH (ref 4.0–10.5)
nRBC: 0 % (ref 0.0–0.2)

## 2021-05-25 LAB — PROCALCITONIN: Procalcitonin: 0.23 ng/mL

## 2021-05-25 MED ORDER — IOHEXOL 350 MG/ML SOLN
75.0000 mL | Freq: Once | INTRAVENOUS | Status: AC | PRN
  Administered 2021-05-25: 75 mL via INTRAVENOUS

## 2021-05-25 MED ORDER — HYDROMORPHONE HCL 1 MG/ML IJ SOLN
0.5000 mg | INTRAMUSCULAR | Status: AC
Start: 2021-05-25 — End: 2021-05-25
  Administered 2021-05-25: 0.5 mg via INTRAVENOUS
  Filled 2021-05-25: qty 1

## 2021-05-25 MED ORDER — LORAZEPAM 2 MG/ML IJ SOLN
0.5000 mg | Freq: Once | INTRAMUSCULAR | Status: AC
  Administered 2021-05-25: 0.5 mg via INTRAVENOUS
  Filled 2021-05-25: qty 1

## 2021-05-25 NOTE — Progress Notes (Incomplete)
Patient refused Anusol suppository and proctofoam.  Multiple attempts made by myself and the patient's significant other to convince him to take the prescribed medications.  Patient educated on the importance of taking medications prescribed.  Nursing staff to continue to monitor.

## 2021-05-25 NOTE — Progress Notes (Signed)
Paged Md with family/patient concerns. Pt heart rate elevated to 130's. EKG performed and transmitted. Fiance' and patient aware that we encourage compliance with medication and care but ultimately patient has say in what he will accept. Pt has issues with swallowing and pills crushed to encourage taking. MD ordered IV ativan for anxiety and ordered palliative consult. Spoke with patient's brother and spouse. Encouraged patient and family to follow up with oncology on Monday for any specific questions about plan of care and goals. Offered emotional support. Pt resting with call bell within reach.  Will continue to monitor.

## 2021-05-25 NOTE — Progress Notes (Addendum)
PROGRESS NOTE    Christopher Henry  WPY:[id number] DOB: 1970-08-29 DOA: 05/28/2021 PCP: Bary Castilla, NP   Chief Complaint  Patient presents with   Shortness of Breath    Brief Narrative/Hospital Course:  Christopher Henry, 49 y.o. male with PMH of COPD, HTN, alcohol abuse, tobacco use 1 pack/day x 35 years sent from pulmonary office to ED for acute respiratory distress hypoxic 80% on room air, nonproductive cough and hematemesis over the past 2 months, painless rectal bleeding, burning sensation on the chest and greater than 50 pound weight loss x12 months.  Upon presentation to the ED, hemoglobin 6.9 g, leukocytosis, D-dimer 3.5, initial troponin 49 lactic acidosis 2.4 creatinine 1.5 chest x-ray RLL pneumonia. CT 18/14 no PE, infiltrative mass of the mediastinum, complete collapse of the right lower lobe with associated complete right lower lobe bronchi occlusion due to endobronchial debris/lesion,?  Mass extension into the carina, left to right midline shift due to decreased lung volume-concern for malignancy likely of the mediastinum with extension into or around the right mainstem bronchi leading to collapse of the right lower lobe, mediastinal lymphadenopathy, 1.7 cm groundglass opacity right apex, small to moderate volume pericardial effusion, subacute to chronic fracture of the medial sternal body. 10/15 underwent bronchoscopy-showed large subcarinal mass indenting the carina and bulging and bilaterally with large mainstem lesion obscuring 50% of lumen, biopsies taken. Large pericardial effusion noted in the echo, seen by cardiology and thoracic surgery, following. S/p left anterior thoracotomy with drainage of pericardial effusion -open pericardial window by [name] 10/17.  10/18-underwent EGD colonoscopy found to have prolapsed grade 4 rectal hemorrhoids, and patient also started on radiotherapy.  Post CT abdomen and pelvis with contrast for staging.  It revealed a large mass within the  lower pelvis identified encasing the distal rectum and herniating through the anus highly concerning for malignancy.  Subjective: Seen and examined at his bedside.  Reports chest discomfort all across.  Worse with coughing and taking a deep breath.  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 biopsy showed atypical cells.Rad Onc has seen and radiotherapy started 10/19 urgently.He is a scheduled to complete radiation treatment 07/08/2021.  Medical oncology input appreciated started initial staging with MRI of the brain with and without-that was normal, pending CT abdomen pelvis, biopsy of pericardium to be sent for PD-L1 and foundation 1 studies.  Malignant large pericardial effusion: S/p open pericardial window by [name] 10/17.  Drain removed 10/20 and chest x-ray no pneumothorax.  Pericardial biopsy showed metastatic carcinoma.  Continue colchicine.  Pain is controlled  Large mass within the lower pelvis identified encasing the distal rectum and herniated through the anus with concern for malignancy Management per Oncology  Chest discomfort status post pericardial window for malignant pericardial effusion Obtain twelve-lead EKG Continue colchicine  Worsening leukocytosis WBC uptrending 36.8 Rocephin for CAP X10 days, last day 05/26/2021. Monitor fever curve and WBC  Resolved acute hypoxemic respiratory failure - was hypoxic 80% on room air with respiratory distress on admission 2/2 pneumonia/mediastinal mass. Currently on room air.    Sepsis secondary to pneumonia RLL post obstructive pneumonia: In the setting of malignancy.  Completed azithromycin, on ceftriaxone.  Seen  by pulmonary, at this time afebrile although leukocytosis worsening, procalcitonin rechecked and negative/reassuring-suspect component of reactive  leukocytosis with ongoing radiation, postobstructive issues.  Can change to Augmentin on discharge.   Recent Labs  Lab 05/21/21 0138 05/22/21 0207 05/23/21 0053 05/24/21 0208 05/25/21 0128  WBC 28.7* 22.0* 26.7* 28.5* 36.8*  PROCALCITON  --   --  <0.10 <0.10 0.23  Hematochezia/melena Acute blood loss anemia in the setting of painless rectal bleeding due to hemorrhoids Anemia of chronic disease as well S/P multiple PRBC transfusions.  Stable  now-Transfuse if less than 7 g.  S/p EGD colonoscopy 10/18-mild esophagitis-positive for Candida esophagitis- large grade 4 prolapsed hemorrhoids-general surgery consulted and advised medical and topical care no plan for surgery at this time- w/ H prep, sitz bath (patient refusing bath).  Transfuse to keep hemoglobin above 8 g may need additional transfusion prior to discharge. Recent Labs  Lab 05/21/21 0138 05/22/21 0207 05/23/21 0053 05/24/21 0208 05/25/21 0128  HGB 8.0* 7.6* 8.0* 7.7* 8.4*  HCT 26.1* 24.5* 25.2* 23.9* 26.8*   Candida esophagitis: Continue fluconazole   Hypomagnesemia repleted  Resolved AKI in the setting of dehydration/pneumonia/malignancy.  Resolved Anion gap metabolic acidosis due to AKI: By 12/09/2019.  R Hypokalemia : Resolved  Possible COPD:  Currently not in exacerbation. Continue Dulera,Incruse Ellipta and bronchodilators as needed  Severe protein calorie moderation with significant weight loss and BMI 16.45.   In the setting of malignancy overall poor prognostic indicator.  History of alcohol use/tobacco use patient reports he recently quit.  Continue thiamine folic acid multivitamins and watch for withdrawals.  Deconditioning:  Evaluated by PT recommendation for home health PT.  Severe malnutrition augment diet as below added Marinol, continue. Nutrition Problem:  Severe Malnutrition Etiology: acute illness (newly diagnosed lung cancer) Signs/Symptoms: severe muscle depletion, severe fat depletion, percent weight loss (6% weight loss in one month) Percent weight loss: 6 % Interventions: MVI, Magic cup, Ensure Enlive (each supplement provides 350kcal and 20 grams of protein)  Thrombocytosis Platelet count greater than 700 K Monitor for now Management per medical oncology.  Critical care time: 65 minutes.    DVT prophylaxis: SCDs Start: 05/24/2021 2105.  No chemeical prophylaxis due to anemia Code Status:   Code Status: Full Code Family Communication: plan of care discussed with patient his fiance and the mother at bedside. Overall prognosis guarded/poor  Status is: Inpatient Remains inpatient appropriate because: Ongoing management of anemia, pericardial effusion, lung mass pneumonia. Anticipate discharge 1-2 days once staging completed okay with radio-onc/oncology  Objective: Vitals: Today's Vitals   05/24/21 2105 05/24/21 2305 05/25/21 0307 05/25/21 0839  BP:  104/72 107/70 99/71  Pulse:  (!) 120 (!) 120 (!) 125  Resp:  _0 Temp:  98.4 F (36.9 C) 98.7 F (37.1 C) 98.6 F (37 C)  TempSrc:  Oral Oral Oral  SpO2: 100% 100% 94% 96%  Weight:      PainSc: 0-No pain      Physical Examination: General exam: Frail-appearing in no acute distress.  He is alert oriented x3. HEENT: Oral mucosa moist ear nose within normal limit.Marland Kitchen Respiratory system: Bilaterally diminished, no use of assessor muscles.   Cardiovascular system: Regular rate and rhythm no rubs or gallops. Gastrointestinal system: Soft nontender normal bowel sounds. Nervous System: Alert and awake.  Nonfocal exam.   Extremities: No edema, distal peripheral pulses palpable. Skin: No rashes, no icteric. MSK: No lower extremity edema bilaterally.  Normal muscle tone.  Medications reviewed:  Scheduled Meds:  (feeding supplement) PROSource Plus  30 mL Oral BID BM    colchicine  0.3 mg  Oral Daily   dronabinol  2.5 mg Oral BID AC   feeding supplement  237 mL Oral QID   fluconazole  200 mg Oral Daily   folic acid  1 mg Oral Daily   hydrocortisone  25 mg Rectal BID   hydrocortisone-pramoxine  1 applicator Rectal BID   mometasone-formoterol  2 puff Inhalation BID   And   umeclidinium bromide  1 puff Inhalation Daily   multivitamin with minerals  1 tablet Oral Daily   pantoprazole  40 mg Oral BID   potassium chloride  20 mEq Oral Daily   psyllium  1 packet Oral Daily   thiamine  100 mg Oral Daily   Or   thiamine  100 mg Intravenous Daily   traMADol  50 mg Oral Q6H   Continuous Infusions:  sodium chloride     cefTRIAXone (ROCEPHIN)  IV 1 g (05/24/21 2056)    Diet Order             Diet regular Room service appropriate? Yes; Fluid consistency: Thin  Diet effective now                   Nutrition Problem: Severe Malnutrition Etiology: acute illness (newly diagnosed lung cancer) Signs/Symptoms: severe muscle depletion, severe fat depletion, percent weight loss (6% weight loss in one month) Percent weight loss: 6 % Interventions: MVI, Magic cup, Ensure Enlive (each supplement provides 350kcal and 20 grams of protein)  Intake/Output  Intake/Output Summary (Last 24 hours) at 05/25/2021 1347 Last data filed at 05/25/2021 1100 Gross per 24 hour  Intake 60 ml  Output 790 ml  Net -730 ml   Intake/Output from previous day: 10/22 0701 - 10/23 0700 In: 30 [P.O.:60] Out: 440 [Urine:440] Net IO Since Admission: 3,533.14 mL [05/25/21 1347]   Weight change:   Wt Readings from Last 3 Encounters:  05/03/2021 61.3 kg  05/11/2021 59.4 kg  04/24/21 60.3 kg    Consultants:see note  Procedures:see note Antimicrobials: Anti-infectives (From admission, onward)    Start     Dose/Rate Route Frequency Ordered Stop   05/25/21 1000  fluconazole (DIFLUCAN) tablet 200 mg       See Hyperspace for full Linked Orders Report.   200 mg Oral Daily 05/24/21  0834 06/07/21 0959   05/24/21 1000  fluconazole (DIFLUCAN) tablet 400 mg       See Hyperspace for full Linked Orders Report.   400 mg Oral  Once 05/24/21 0834 05/24/21 0913   05/21/2021 2145  cefTRIAXone (ROCEPHIN) 1 g in sodium chloride 0.9 % 100 mL IVPB        1 g 200 mL/hr over 30 Minutes Intravenous Every 24 hours 05/15/2021 2053 05/26/21 2144   06/02/2021 2145  azithromycin (ZITHROMAX) 500 mg in sodium chloride 0.9 % 250 mL IVPB        500 mg 250 mL/hr over 60 Minutes Intravenous Every 24 hours 05/05/2021 2053 05/30/2021 2322   05/22/2021 1545  ceFEPIme (MAXIPIME) 2 g in sodium chloride 0.9 % 100 mL IVPB        2 g 200 mL/hr over 30 Minutes Intravenous  Once 05/15/2021 1539 05/11/2021 1640      Culture/Microbiology    Component Value Date/Time   SDES BLOOD LEFT ANTECUBITAL 05/25/2021 1604   SPECREQUEST  05/15/2021 1604    BOTTLES DRAWN AEROBIC AND ANAEROBIC Blood Culture results may not be optimal due to an inadequate volume of blood received in culture bottles   CULT  05/08/2021 1604  NO GROWTH 5 DAYS Performed at [facility], [place] [address], [place], [place] [address]    REPTSTATUS 05/21/2021 FINAL 05/29/2021 1604    Other culture-see note  Unresulted Labs (From admission, onward)     Start     Ordered   05/25/21 0500  CBC  Daily,   R     Question:  Specimen collection method  Answer:  Lab=Lab collect   05/24/21 0759           Data Reviewed: I have personally reviewed following labs and imaging studies CBC: Recent Labs  Lab 05/21/21 0138 05/22/21 0207 05/23/21 0053 05/24/21 0208 05/25/21 0128  WBC 28.7* 22.0* 26.7* 28.5* 36.8*  HGB 8.0* 7.6* 8.0* 7.7* 8.4*  HCT 26.1* 24.5* 25.2* 23.9* 26.8*  MCV 82.9 81.1 79.7* 79.7* 79.5*  PLT 570* 579* 642* 644* 709*   Basic Metabolic Panel: Recent Labs  Lab 05/18/2021 0231 05/21/21 0138 05/22/21 0207 05/23/21 0053 05/24/21 0208  NA 138 138 137 138 135  K 4.0 3.2* 3.4* 3.8 3.9  CL 108 112* 109 109 106  CO2 19* 19*  20* 21* 21*  GLUCOSE 142* 111* 95 110* 95  BUN _0 CREATININE 1.08 1.09 0.97 1.15 1.00  CALCIUM 8.2* 8.2* 8.4* 8.6* 8.5*  MG  --   --   --  1.5*  --    GFR: Estimated Creatinine Clearance: 76.6 mL/min (by C-G formula based on SCr of 1 mg/dL). Liver Function Tests: No results for input(s): AST, ALT, ALKPHOS, BILITOT, PROT, ALBUMIN in the last 168 hours.  No results for input(s): LIPASE, AMYLASE in the last 168 hours.  No results for input(s): AMMONIA in the last 168 hours.  Coagulation Profile: No results for input(s): INR, PROTIME in the last 168 hours.  Cardiac Enzymes: No results for input(s): CKTOTAL, CKMB, CKMBINDEX, TROPONINI in the last 168 hours. BNP (last 3 results) No results for input(s): PROBNP in the last 8760 hours. HbA1C: No results for input(s): HGBA1C in the last 72 hours. CBG: No results for input(s): GLUCAP in the last 168 hours. Lipid Profile: No results for input(s): CHOL, HDL, LDLCALC, TRIG, CHOLHDL, LDLDIRECT in the last 72 hours. Thyroid Function Tests: No results for input(s): TSH, T4TOTAL, FREET4, T3FREE, THYROIDAB in the last 72 hours. Anemia Panel: No results for input(s): VITAMINB12, FOLATE, FERRITIN, TIBC, IRON, RETICCTPCT in the last 72 hours.  Sepsis Labs: Recent Labs  Lab 05/23/21 0053 05/24/21 0208 05/25/21 0128  PROCALCITON <0.10 <0.10 0.23    Recent Results (from the past 240 hour(s))  Resp Panel by RT-PCR (Flu A&B, Covid) Nasopharyngeal Swab     Status: None   Collection Time: 05/27/2021  3:38 PM   Specimen: Nasopharyngeal Swab; Nasopharyngeal(NP) swabs in vial transport medium  Result Value Ref Range Status   SARS Coronavirus 2 by RT PCR NEGATIVE NEGATIVE Final    Comment: (NOTE) SARS-CoV-2 target nucleic acids are NOT DETECTED.  The SARS-CoV-2 RNA is generally detectable in upper respiratory specimens during the acute phase of infection. The lowest concentration of SARS-CoV-2 viral copies this assay can detect  is 138 copies/mL. A negative result does not preclude SARS-Cov-2 infection and should not be used as the sole basis for treatment or other patient management decisions. A negative result may occur with  improper specimen collection/handling, submission of specimen other than nasopharyngeal swab, presence of viral mutation(s) within the areas targeted by this assay, and inadequate number of viral copies(<138 copies/mL). A negative result must  be combined with clinical observations, patient history, and epidemiological information. The expected result is Negative.  Fact Sheet for Patients:  EntrepreneurPulse.com.au  Fact Sheet for Healthcare Providers:  IncredibleEmployment.be  This test is no t yet approved or cleared by the [place] FDA and  has been authorized for detection and/or diagnosis of SARS-CoV-2 by FDA under an Emergency Use Authorization (EUA). This EUA will remain  in effect (meaning this test can be used) for the duration of the COVID-19 declaration under Section 564(b)(1) of the Act, 21 U.S.C.section 360bbb-3(b)(1), unless the authorization is terminated  or revoked sooner.       Influenza A by PCR NEGATIVE NEGATIVE Final   Influenza B by PCR NEGATIVE NEGATIVE Final    Comment: (NOTE) The Xpert Xpress SARS-CoV-2/FLU/RSV plus assay is intended as an aid in the diagnosis of influenza from Nasopharyngeal swab specimens and should not be used as a sole basis for treatment. Nasal washings and aspirates are unacceptable for Xpert Xpress SARS-CoV-2/FLU/RSV testing.  Fact Sheet for Patients: EntrepreneurPulse.com.au  Fact Sheet for Healthcare Providers: IncredibleEmployment.be  This test is not yet approved or cleared by the [place] FDA and has been authorized for detection and/or diagnosis of SARS-CoV-2 by FDA under an Emergency Use Authorization (EUA). This EUA will remain in effect  (meaning this test can be used) for the duration of the COVID-19 declaration under Section 564(b)(1) of the Act, 21 U.S.C. section 360bbb-3(b)(1), unless the authorization is terminated or revoked.  Performed at [facility], [place] [address] [place], [place] [address]   Blood culture (routine single)     Status: None   Collection Time: 05/24/2021  4:04 PM   Specimen: BLOOD  Result Value Ref Range Status   Specimen Description BLOOD LEFT ANTECUBITAL  Final   Special Requests   Final    BOTTLES DRAWN AEROBIC AND ANAEROBIC Blood Culture results may not be optimal due to an inadequate volume of blood received in culture bottles   Culture   Final    NO GROWTH 5 DAYS Performed at [facility], [place] [address], [place], [place] [address]    Report Status 05/21/2021 FINAL  Final      Radiology Studies: MR BRAIN W WO CONTRAST  Result Date: 05/23/2021 CLINICAL DATA:  Non-small cell lung cancer.  Staging. EXAM: MRI HEAD WITHOUT AND WITH CONTRAST TECHNIQUE: Multiplanar, multiecho pulse sequences of the brain and surrounding structures were obtained without and with intravenous contrast. CONTRAST:  6.67m GADAVIST GADOBUTROL 1 MMOL/ML IV SOLN COMPARISON:  None. FINDINGS: Brain: The brain has a normal appearance without evidence of malformation, atrophy, old or acute small or large vessel infarction, mass lesion, hemorrhage, hydrocephalus or extra-axial collection. Few punctate foci of nonenhancing T2 signal in the frontal white matter, often seen in healthy individuals and not likely significant. Vascular: Major vessels at the base of the brain show flow. Venous sinuses appear patent. Skull and upper cervical spine: Normal. Sinuses/Orbits: Clear/normal. Other: None significant. IMPRESSION: Normal MRI of the brain.  No evidence of metastatic disease. Electronically Signed   By: MNelson ChimesM.D.   On: 05/23/2021 18:35   CT ABDOMEN PELVIS W CONTRAST  Result Date: 05/25/2021 CLINICAL DATA:   Cancer staging.  Non-small cell lung cancer. EXAM: CT ABDOMEN AND PELVIS WITH CONTRAST TECHNIQUE: Multidetector CT imaging of the abdomen and pelvis was performed using the standard protocol following bolus administration of intravenous contrast. CONTRAST:  769mOMNIPAQUE IOHEXOL 350 MG/ML SOLN COMPARISON:  CT chest 05/04/2021.  FINDINGS: Lower chest: Interval loculation of previously noted moderate pericardial effusion. 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 Complete atelectasis of the right lower lobe is again noted. New small right pleural effusion and small to moderate left effusion. Nodularity and interlobular septal thickening is identified within the imaged portions of the right middle lobe. Hepatobiliary: No suspicious liver abnormality. Left lobe of liver is either surgically absent or atrophic. Gallbladder unremarkable. Pancreas: Unremarkable. No pancreatic ductal dilatation or surrounding inflammatory changes. Spleen: Normal in size without focal abnormality. Adrenals/Urinary Tract: Normal left adrenal gland. Small left adrenal nodule measures 1.1 cm, image 40/6. No kidney mass or hydronephrosis. Bladder unremarkable. Stomach/Bowel: Stomach appears normal. The appendix is not confidently identified. No signs of bowel obstruction. Vascular/Lymphatic: Mild aortic atherosclerosis.  No aneurysm. Prominent left retroperitoneal lymph nodes are identified measuring up to 9 mm, image 35/3. No abdominopelvic adenopathy however Reproductive: Prostate is unremarkable. Other: Signs of peritoneal disease identified. There is a enhancing soft tissue nodule along the left pericolic gutter which measures 1.6 cm, image 43/2. Left upper quadrant peritoneal nodule anterior to the spleen measures 0.9 cm, image 17/3. There is a large mass within the low pelvis, posterior to the prostate measuring 8.5 x 6.1 by 8.4 cm, image 50/6 and image 76/3. This encases the distal rectum and appears to herniate through the anus. There are  cystic and solid components associated with this mass. No significant ascites identified within the abdomen or pelvis. Musculoskeletal: Previous ORIF of the right femur. Bones appear diffusely osteopenic. No definite signs of osseous metastasis. IMPRESSION: 1. Large mass within the low pelvis is identified encasing the distal rectum and herniating through the anus. This should be visible upon physical exam. This is highly concerning for malignancy and may be an unusual presentation of peritoneal disease or reflect primary rectal neoplasm. Correlation with physical exam findings. When the patient is clinically stable, able to follow instructions, and remain motionless a nonemergent, contrast enhanced pelvic MRI may provide more definitive evaluation of this mass. Alternatively, a PET-CT may provide a more thorough and whole body assessment none of tumor burden for staging purposes. 2. Signs of peritoneal disease are identified including left pericolic gutter and left upper quadrant peritoneal nodules. 3. Interval loculation of previously noted moderate pericardial effusion. 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 4. New small right pleural effusion and small to moderate left effusion. Nodularity and interlobular septal thickening within the imaged portions of the right middle lobe concerning for lymphangitic spread of disease. 5. Small left adrenal nodule is identified. Nonspecific. Cannot exclude metastatic disease. 6. Aortic Atherosclerosis (ICD10-I70.0). Electronically Signed   By: [name] Moors M.D.   On: 05/25/2021 05:50     LOS: 9 days   Kayleen Memos, MD [facility]  05/25/2021, 1:47 PM

## 2021-05-25 NOTE — Progress Notes (Signed)
Encouraged patient to take suppository and use anusol cream. Pt did not wish to take. Fiance' encouraged to address with patient. Per report patient refusing sitz bath. Pt wanting to be left alone as he could not get any rest due to "everyone" coming into his room. Payton Emerald, RN

## 2021-05-26 ENCOUNTER — Ambulatory Visit
Admit: 2021-05-26 | Discharge: 2021-05-26 | Disposition: A | Discharge status: Home / Self Care | Payer: 59 | Attending: Radiation Oncology | Admitting: Radiation Oncology

## 2021-05-26 DIAGNOSIS — C3490 Malignant neoplasm of unspecified part of unspecified bronchus or lung: Secondary | ICD-10-CM | POA: Diagnosis not present

## 2021-05-26 LAB — CBC
HCT: 23.2 % — ABNORMAL LOW (ref 39.0–52.0)
Hemoglobin: 7.4 g/dL — ABNORMAL LOW (ref 13.0–17.0)
MCH: 24.7 pg — ABNORMAL LOW (ref 26.0–34.0)
MCHC: 31.9 g/dL (ref 30.0–36.0)
MCV: 77.3 fL — ABNORMAL LOW (ref 80.0–100.0)
Platelets: 740 10*3/uL — ABNORMAL HIGH (ref 150–400)
RBC: 3 MIL/uL — ABNORMAL LOW (ref 4.22–5.81)
RDW: 19.5 % — ABNORMAL HIGH (ref 11.5–15.5)
WBC: 30.6 10*3/uL — ABNORMAL HIGH (ref 4.0–10.5)
nRBC: 0 % (ref 0.0–0.2)

## 2021-05-26 LAB — PREPARE RBC (CROSSMATCH)

## 2021-05-26 MED ORDER — SODIUM CHLORIDE 0.9% IV SOLUTION: Freq: Once | INTRAVENOUS | Status: AC

## 2021-05-26 NOTE — Progress Notes (Signed)
Patient to [facility] for radiation treatment with [facility].

## 2021-05-26 NOTE — Progress Notes (Signed)
Physical Therapy Treatment Patient Details Name: Christopher Henry MRN: [id number] DOB: 04/27/1971 Today's Date: 05/26/2021   History of Present Illness The pt is a 50 yo male presenting 10/14 due to SOB with hypoxia. Pt also unintentional weight loss and hemoptysis x2.  Pt found to have pericardial effusion in the setting of locally advanced lung cancer as well as prolapsed grade IV hemorrhoids. Underwent pericardial window 10/17. s/p EGD on 10/18, initiation of radiation on 10/18. CT on 10/23 showed large pelvic mass.  PMH includes: tobacco use (x35 years) and HTN.    PT Comments    Pt with slow progress with mobility. Activity tolerance is biggest limitation. Pt requires extended rest breaks with all activity. At this level of activity pt likely will only be able to get himself up to a bsc or to a chair or w/c at home. Would need to have all meals ready for him while his significant other is at work. Will need w/c for any out of house needs and for any extended time up in the house.  Agree with plan for palliative consult.  Recommendations for follow up therapy are one component of a multi-disciplinary discharge planning process, led by the attending physician.  Recommendations may be updated based on patient status, additional functional criteria and insurance authorization.  Follow Up Recommendations  Home health PT     Assistance Recommended at Discharge Intermittent Supervision/Assistance  Equipment Recommendations  Rolling walker (2 wheels);Wheelchair (measurements PT);Wheelchair cushion (measurements PT);3in1 (PT)    Recommendations for Other Services       Precautions / Restrictions Precautions Precautions: Fall     Mobility  Bed Mobility Overal bed mobility: Modified Independent Bed Mobility: Supine to Sit     Supine to sit: Modified independent (Device/Increase time)          Transfers Overall transfer level: Needs assistance Equipment used: None Transfers: Sit  to/from [facility]   Stand pivot transfers: Min guard Squat pivot transfers: Min guard       General transfer comment: Assist for safety to stand from bed and for stand pivot bed to bsc.    Ambulation/Gait Ambulation/Gait assistance: Min assist Gait Distance (Feet): 20 Feet Assistive device: None (pt declined assistive device) Gait Pattern/deviations: Step-through pattern;Decreased step length - left;Decreased stance time - right;Trunk flexed Gait velocity: decr Gait velocity interpretation: <1.8 ft/sec, indicate of risk for recurrent falls General Gait Details: Assist for balance and safety.   Stairs             Wheelchair Mobility    Modified Rankin (Stroke Patients Only)       Balance Overall balance assessment: Needs assistance Sitting-balance support: Feet supported;No upper extremity supported Sitting balance-Leahy Scale: Good     Standing balance support: Bilateral upper extremity supported;During functional activity Standing balance-Leahy Scale: Poor Standing balance comment: UE support and min guard for static standing                            Cognition Arousal/Alertness: Awake/alert Behavior During Therapy: Flat affect Overall Cognitive Status: Within Functional Limits for tasks assessed                                          Exercises      General Comments General comments (skin integrity, edema, etc.): HR to 130 with activity  Pertinent Vitals/Pain      Home Living                          Prior Function            PT Goals (current goals can now be found in the care plan section) Acute Rehab PT Goals Patient Stated Goal: None stated Progress towards PT goals: Progressing toward goals    Frequency    Min 3X/week      PT Plan Current plan remains appropriate    Co-evaluation              AM-PAC PT "6 Clicks" Mobility   Outcome Measure  Help needed  turning from your back to your side while in a flat bed without using bedrails?: None Help needed moving from lying on your back to sitting on the side of a flat bed without using bedrails?: None Help needed moving to and from a bed to a chair (including a wheelchair)?: A Little Help needed standing up from a chair using your arms (e.g., wheelchair or bedside chair)?: A Little Help needed to walk in hospital room?: A Little Help needed climbing 3-5 steps with a railing? : A Lot 6 Click Score: 19    End of Session   Activity Tolerance: Patient limited by fatigue Patient left: in bed;with call bell/phone within reach Nurse Communication: Mobility status PT Visit Diagnosis: Other abnormalities of gait and mobility (R26.89);Muscle weakness (generalized) (M62.81);Unsteadiness on feet (R26.81)     Time: 1340-1415 (Stepped out of room for 10 minutes with pt on BSC) PT Time Calculation (min) (ACUTE ONLY): 35 min  Charges:  $Gait Training: 8-22 mins                     [place] Pager [phone] Office [place] 05/26/2021, 2:46 PM

## 2021-05-26 NOTE — Progress Notes (Signed)
PROGRESS NOTE    Christopher Henry  XJD:[id number] DOB: 1970/12/21 DOA: 06/02/2021 PCP: Bary Castilla, NP   Chief Complaint  Patient presents with   Shortness of Breath    Brief Narrative/Hospital Course:  Christopher Henry, 50 y.o. male with PMH of COPD, HTN, alcohol abuse, tobacco use 1 pack/day x 35 years sent from pulmonary office to ED for acute respiratory distress hypoxic 80% on room air, nonproductive cough and hematemesis over the past 2 months, painless rectal bleeding, internal hemorrhoids, burning sensation on the chest and greater than 50 pound weight loss x12 months.  Upon presentation to the ED, hemoglobin 6.9 g, leukocytosis, D-dimer 3.5, initial troponin 49 lactic acidosis 2.4 creatinine 1.5 chest x-ray RLL pneumonia. CT 18/14 no PE, infiltrative mass of the mediastinum, complete collapse of the right lower lobe with associated complete right lower lobe bronchi occlusion due to endobronchial debris/lesion,?  Mass extension into the carina, left to right midline shift due to decreased lung volume-concern for malignancy likely of the mediastinum with extension into or around the right mainstem bronchi leading to collapse of the right lower lobe, mediastinal lymphadenopathy, 1.7 cm groundglass opacity right apex, small to moderate volume pericardial effusion, subacute to chronic fracture of the medial sternal body.  06/01/2021 underwent bronchoscopy-showed large subcarinal mass indenting the carina and bulging and bilaterally with large mainstem lesion obscuring 50% of lumen, biopsies taken. Large pericardial effusion noted on 2D echo, seen by cardiology and thoracic surgery. S/p left anterior thoracotomy with drainage of pericardial effusion -open pericardial window by [name] 05/12/2021.  10/18-underwent EGD colonoscopy found to have prolapsed grade 4 rectal hemorrhoids, and patient also started on radiotherapy.  Post CT abdomen and pelvis with contrast for staging.  It revealed a  large mass within the lower pelvis identified encasing the distal rectum and herniating through the anus highly concerning for malignancy.  Subjective: Seen and examined at his bedside.  His wife was present in the room.  Was attempting to have a bowel movement but with difficulty.  Patient reports intermittent painless rectal bleeding.  Hemoglobin dropped 7.4 from 8.4.  2 units PRBC ordered to be transfused.  Plan for radiation today.    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 biopsy showed atypical cells. Rad Onc has seen and radiotherapy started 10/19 urgently.  Radiation session on 05/26/2021.  He is scheduled to complete radiation treatment 07/08/2021.  Medical oncology input appreciated started initial staging with MRI of the brain with and without-that was normal, pending CT abdomen pelvis, biopsy of pericardium to be sent for PD-L1 and foundation 1 studies.  Malignant large pericardial effusion: S/p open pericardial window by [name] 10/17.  Drain removed 10/20 and chest x-ray no pneumothorax.  Pericardial biopsy showed metastatic carcinoma.  Continue colchicine.  Pain is controlled  Large mass within the lower pelvis identified encasing the distal rectum and herniated through the anus with concern for malignancy Management per general surgery and medical oncology.  Chest discomfort status post pericardial window for malignant pericardial effusion Pain is improved with leaning forward. Continue colchicine As needed narcotic analgesics.  Leukocytosis, suspect multifactorial, including reactive WBC uptrending 36.8>> 30 K Completed Rocephin for CAP X10 days, last day  05/26/2021. Continue to monitor fever curve and WBC  Resolved acute  hypoxemic respiratory failure -  was hypoxic 80% on room air with respiratory distress on admission 2/2 pneumonia/mediastinal mass.  Currently on room air.  Continue to maintain O2 saturation greater than 92%  Sepsis secondary to pneumonia RLL post obstructive pneumonia: In the setting of malignancy.  Completed azithromycin, on ceftriaxone.  Seen by pulmonary, at this time afebrile although leukocytosis worsening, procalcitonin rechecked and negative/reassuring-suspect component of reactive leukocytosis with ongoing radiation, postobstructive issues.  Recent Labs  Lab 05/22/21 0207 05/23/21 0053 05/24/21 0208 05/25/21 0128 05/26/21 0343  WBC 22.0* 26.7* 28.5* 36.8* 30.6*  PROCALCITON  --  <0.10 <0.10 0.23  --   Hematochezia/melena Acute blood loss anemia in the setting of painless rectal bleeding due to hemorrhoids Anemia of chronic disease as well S/P multiple PRBC transfusions.  Stable  now-Transfuse if less than 7 g.  S/p EGD colonoscopy 10/18-mild esophagitis-positive for Candida esophagitis- large grade 4 prolapsed hemorrhoids-general surgery consulted and advised medical and topical care no plan for surgery at this time- w/ H prep, sitz bath (patient refusing bath).  Transfuse to keep hemoglobin above 8 g may need additional transfusion prior to discharge. Recent Labs  Lab 05/22/21 0207 05/23/21 0053 05/24/21 0208 05/25/21 0128 05/26/21 0343  HGB 7.6* 8.0* 7.7* 8.4* 7.4*  HCT 24.5* 25.2* 23.9* 26.8* 23.2*   Candida esophagitis: Continue fluconazole   Dysphagia Having trouble swallowing his p.o. medications Feeling of food getting stuck in his throat. Speech therapist evaluation  Hypomagnesemia repleted  Resolved AKI in the setting of dehydration/pneumonia/malignancy.  Resolved  Non Anion gap metabolic acidosis due to AKI:  Serum bicarb 21, anion gap of 8. Monitor for now  Resolved post  repletion: Hypokalemia  Possible COPD:  Currently not in exacerbation. Continue Dulera,Incruse Ellipta and bronchodilators as needed  Severe protein calorie moderation with significant weight loss and BMI 16.45.   In the setting of malignancy overall poor prognostic indicator.  History of alcohol use/tobacco use patient reports he recently quit.  Continue thiamine folic acid multivitamins.  Deconditioning:  Evaluated by PT recommendation for home health PT.  Severe malnutrition Continue Marinol Continue to encourage increase oral protein calorie intake.  Nutrition Problem: Severe Malnutrition Etiology: acute illness (newly diagnosed lung cancer) Signs/Symptoms: severe muscle depletion, severe fat depletion, percent weight loss (6% weight loss in one month) Percent weight loss: 6 % Interventions: MVI, Magic cup, Ensure Enlive (each supplement provides 350kcal and 20 grams of protein)  Thrombocytosis Platelet count greater than 700 K Monitor for now Management per medical oncology.  Critical care time: 65 minutes.    DVT prophylaxis: SCDs Start: 05/21/2021 2105.  No chemeical prophylaxis due to anemia Code Status:   Code Status: Full Code Family Communication: Plan of care discussed with the patient and his wife at bedside.    Status is: Inpatient Remains inpatient appropriate because: Ongoing management of anemia, pericardial effusion, lung mass pneumonia. Anticipate discharge 1-2 days once staging completed and okay with radio-onc/oncology  Objective: Vitals: Today's Vitals   05/26/21 0851 05/26/21 0940 05/26/21 1145 05/26/21 1540  BP:   112/78 102/77  Pulse:   (!) 120 (!) 123  Resp:   20 (!) 24  Temp:   97.6 F (36.4 C) 97.6 F (36.4 C)  TempSrc:   Oral Oral  SpO2:  98% 96% 97%  Weight:      PainSc: Asleep      Physical Examination: General exam: Very frail-appearing no acute distress.  He is alert and oriented x3.   HEENT: Oral mucosa  moist, ears and nose  within normal limit. Respiratory system: Mild rales at bases no wheezing noted.  Poor inspiratory effort.   Cardiovascular system: Regular rate and rhythm no rubs or gallops.   Gastrointestinal system: Soft nontender normal bowel sounds present. Nervous System: Alert and awake.  Nonfocal exam. Extremities: No edema, dorsalis pedis pulses. Skin: No rashes no icteric  MSK: No lower extremity edema bilaterally.  Medications reviewed:  Scheduled Meds:  (feeding supplement) PROSource Plus  30 mL Oral BID BM   sodium chloride   Intravenous Once   colchicine  0.3 mg Oral Daily   dronabinol  2.5 mg Oral BID AC   feeding supplement  237 mL Oral QID   fluconazole  200 mg Oral Daily   folic acid  1 mg Oral Daily   hydrocortisone  25 mg Rectal BID   hydrocortisone-pramoxine  1 applicator Rectal BID   mometasone-formoterol  2 puff Inhalation BID   And   umeclidinium bromide  1 puff Inhalation Daily   multivitamin with minerals  1 tablet Oral Daily   pantoprazole  40 mg Oral BID   potassium chloride  20 mEq Oral Daily   psyllium  1 packet Oral Daily   thiamine  100 mg Oral Daily   Or   thiamine  100 mg Intravenous Daily   traMADol  50 mg Oral Q6H   Continuous Infusions:  sodium chloride      Diet Order             Diet regular Room service appropriate? Yes; Fluid consistency: Thin  Diet effective now                   Nutrition Problem: Severe Malnutrition Etiology: acute illness (newly diagnosed lung cancer) Signs/Symptoms: severe muscle depletion, severe fat depletion, percent weight loss (6% weight loss in one month) Percent weight loss: 6 % Interventions: MVI, Magic cup, Ensure Enlive (each supplement provides 350kcal and 20 grams of protein)  Intake/Output  Intake/Output Summary (Last 24 hours) at 05/26/2021 1545 Last data filed at 05/26/2021 1300 Gross per 24 hour  Intake --  Output 350 ml  Net -350 ml   Intake/Output from previous day: 10/23 0701 - 10/24  0700 In: -  Out: 350 [Urine:350] Net IO Since Admission: 3,183.14 mL [05/26/21 1545]   Weight change:   Wt Readings from Last 3 Encounters:  05/18/2021 61.3 kg  05/18/2021 59.4 kg  04/24/21 60.3 kg    Consultants:see note  Procedures:see note Antimicrobials: Anti-infectives (From admission, onward)    Start     Dose/Rate Route Frequency Ordered Stop   05/25/21 1000  fluconazole (DIFLUCAN) tablet 200 mg       See Hyperspace for full Linked Orders Report.   200 mg Oral Daily 05/24/21 0834 06/07/21 0959   05/24/21 1000  fluconazole (DIFLUCAN) tablet 400 mg       See Hyperspace for full Linked Orders Report.   400 mg Oral  Once 05/24/21 0834 05/24/21 0913   05/18/2021 2145  cefTRIAXone (ROCEPHIN) 1 g in sodium chloride 0.9 % 100 mL IVPB        1 g 200 mL/hr over 30 Minutes Intravenous Every 24 hours 05/09/2021 2053 05/25/21 2212   06/02/2021 2145  azithromycin (ZITHROMAX) 500 mg in sodium chloride 0.9 % 250 mL IVPB        500 mg 250 mL/hr over 60 Minutes Intravenous Every 24 hours 05/29/2021 2053 05/22/2021 2322   05/06/2021 1545  ceFEPIme (MAXIPIME) 2  g in sodium chloride 0.9 % 100 mL IVPB        2 g 200 mL/hr over 30 Minutes Intravenous  Once 05/06/2021 1539 05/12/2021 1640      Culture/Microbiology    Component Value Date/Time   SDES BLOOD LEFT ANTECUBITAL 05/22/2021 1604   SPECREQUEST  05/21/2021 1604    BOTTLES DRAWN AEROBIC AND ANAEROBIC Blood Culture results may not be optimal due to an inadequate volume of blood received in culture bottles   CULT  05/18/2021 1604    NO GROWTH 5 DAYS Performed at [facility], [place] [address] [place], [place] [address]    REPTSTATUS 05/21/2021 FINAL 05/05/2021 1604    Other culture-see note  Unresulted Labs (From admission, onward)     Start     Ordered   05/25/21 0500  CBC  Daily,   R     Question:  Specimen collection method  Answer:  Lab=Lab collect   05/24/21 0759           Data Reviewed: I have personally reviewed following labs  and imaging studies CBC: Recent Labs  Lab 05/22/21 0207 05/23/21 0053 05/24/21 0208 05/25/21 0128 05/26/21 0343  WBC 22.0* 26.7* 28.5* 36.8* 30.6*  HGB 7.6* 8.0* 7.7* 8.4* 7.4*  HCT 24.5* 25.2* 23.9* 26.8* 23.2*  MCV 81.1 79.7* 79.7* 79.5* 77.3*  PLT 579* 642* 644* 775* 627*   Basic Metabolic Panel: Recent Labs  Lab 05/25/2021 0231 05/21/21 0138 05/22/21 0207 05/23/21 0053 05/24/21 0208  NA 138 138 137 138 135  K 4.0 3.2* 3.4* 3.8 3.9  CL 108 112* 109 109 106  CO2 19* 19* 20* 21* 21*  GLUCOSE 142* 111* 95 110* 95  BUN '12 18 12 10 9  ' CREATININE 1.08 1.09 0.97 1.15 1.00  CALCIUM 8.2* 8.2* 8.4* 8.6* 8.5*  MG  --   --   --  1.5*  --    GFR: Estimated Creatinine Clearance: 76.6 mL/min (by C-G formula based on SCr of 1 mg/dL). Liver Function Tests: No results for input(s): AST, ALT, ALKPHOS, BILITOT, PROT, ALBUMIN in the last 168 hours.  No results for input(s): LIPASE, AMYLASE in the last 168 hours.  No results for input(s): AMMONIA in the last 168 hours.  Coagulation Profile: No results for input(s): INR, PROTIME in the last 168 hours.  Cardiac Enzymes: No results for input(s): CKTOTAL, CKMB, CKMBINDEX, TROPONINI in the last 168 hours. BNP (last 3 results) No results for input(s): PROBNP in the last 8760 hours. HbA1C: No results for input(s): HGBA1C in the last 72 hours. CBG: No results for input(s): GLUCAP in the last 168 hours. Lipid Profile: No results for input(s): CHOL, HDL, LDLCALC, TRIG, CHOLHDL, LDLDIRECT in the last 72 hours. Thyroid Function Tests: No results for input(s): TSH, T4TOTAL, FREET4, T3FREE, THYROIDAB in the last 72 hours. Anemia Panel: No results for input(s): VITAMINB12, FOLATE, FERRITIN, TIBC, IRON, RETICCTPCT in the last 72 hours.  Sepsis Labs: Recent Labs  Lab 05/23/21 0053 05/24/21 0208 05/25/21 0128  PROCALCITON <0.10 <0.10 0.23    Recent Results (from the past 240 hour(s))  Blood culture (routine single)     Status: None    Collection Time: 05/18/2021  4:04 PM   Specimen: BLOOD  Result Value Ref Range Status   Specimen Description BLOOD LEFT ANTECUBITAL  Final   Special Requests   Final    BOTTLES DRAWN AEROBIC AND ANAEROBIC Blood Culture results may not be optimal due to an inadequate volume of blood  received in culture bottles   Culture   Final    NO GROWTH 5 DAYS Performed at [facility], [place] [address], [place], [place] [address]    Report Status 05/21/2021 FINAL  Final      Radiology Studies: CT ABDOMEN PELVIS W CONTRAST  Result Date: 05/25/2021 CLINICAL DATA:  Cancer staging.  Non-small cell lung cancer. EXAM: CT ABDOMEN AND PELVIS WITH CONTRAST TECHNIQUE: Multidetector CT imaging of the abdomen and pelvis was performed using the standard protocol following bolus administration of intravenous contrast. CONTRAST:  54m OMNIPAQUE IOHEXOL 350 MG/ML SOLN COMPARISON:  CT chest 05/06/2021. FINDINGS: Lower chest: Interval loculation of previously noted moderate pericardial effusion. 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 Complete atelectasis of the right lower lobe is again noted. New small right pleural effusion and small to moderate left effusion. Nodularity and interlobular septal thickening is identified within the imaged portions of the right middle lobe. Hepatobiliary: No suspicious liver abnormality. Left lobe of liver is either surgically absent or atrophic. Gallbladder unremarkable. Pancreas: Unremarkable. No pancreatic ductal dilatation or surrounding inflammatory changes. Spleen: Normal in size without focal abnormality. Adrenals/Urinary Tract: Normal left adrenal gland. Small left adrenal nodule measures 1.1 cm, image 40/6. No kidney mass or hydronephrosis. Bladder unremarkable. Stomach/Bowel: Stomach appears normal. The appendix is not confidently identified. No signs of bowel obstruction. Vascular/Lymphatic: Mild aortic atherosclerosis.  No aneurysm. Prominent left retroperitoneal lymph nodes are  identified measuring up to 9 mm, image 35/3. No abdominopelvic adenopathy however Reproductive: Prostate is unremarkable. Other: Signs of peritoneal disease identified. There is a enhancing soft tissue nodule along the left pericolic gutter which measures 1.6 cm, image 43/2. Left upper quadrant peritoneal nodule anterior to the spleen measures 0.9 cm, image 17/3. There is a large mass within the low pelvis, posterior to the prostate measuring 8.5 x 6.1 by 8.4 cm, image 50/6 and image 76/3. This encases the distal rectum and appears to herniate through the anus. There are cystic and solid components associated with this mass. No significant ascites identified within the abdomen or pelvis. Musculoskeletal: Previous ORIF of the right femur. Bones appear diffusely osteopenic. No definite signs of osseous metastasis. IMPRESSION: 1. Large mass within the low pelvis is identified encasing the distal rectum and herniating through the anus. This should be visible upon physical exam. This is highly concerning for malignancy and may be an unusual presentation of peritoneal disease or reflect primary rectal neoplasm. Correlation with physical exam findings. When the patient is clinically stable, able to follow instructions, and remain motionless a nonemergent, contrast enhanced pelvic MRI may provide more definitive evaluation of this mass. Alternatively, a PET-CT may provide a more thorough and whole body assessment none of tumor burden for staging purposes. 2. Signs of peritoneal disease are identified including left pericolic gutter and left upper quadrant peritoneal nodules. 3. Interval loculation of previously noted moderate pericardial effusion. 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 4. New small right pleural effusion and small to moderate left effusion. Nodularity and interlobular septal thickening within the imaged portions of the right middle lobe concerning for lymphangitic spread of disease. 5. Small left adrenal  nodule is identified. Nonspecific. Cannot exclude metastatic disease. 6. Aortic Atherosclerosis (ICD10-I70.0). Electronically Signed   By: TKerby MoorsM.D.   On: 05/25/2021 05:50     LOS: 10 days   CKayleen Memos MD [facility]  05/26/2021, 3:45 PM

## 2021-05-27 ENCOUNTER — Ambulatory Visit
Admit: 2021-05-27 | Discharge: 2021-05-27 | Disposition: A | Discharge status: Home / Self Care | Payer: 59 | Attending: Radiation Oncology | Admitting: Radiation Oncology

## 2021-05-27 ENCOUNTER — Inpatient Hospital Stay (HOSPITAL_COMMUNITY): Payer: 59

## 2021-05-27 DIAGNOSIS — C3490 Malignant neoplasm of unspecified part of unspecified bronchus or lung: Secondary | ICD-10-CM | POA: Diagnosis not present

## 2021-05-27 LAB — CBC
HCT: 27.4 % — ABNORMAL LOW (ref 39.0–52.0)
Hemoglobin: 8.9 g/dL — ABNORMAL LOW (ref 13.0–17.0)
MCH: 25.5 pg — ABNORMAL LOW (ref 26.0–34.0)
MCHC: 32.5 g/dL (ref 30.0–36.0)
MCV: 78.5 fL — ABNORMAL LOW (ref 80.0–100.0)
Platelets: 545 10*3/uL — ABNORMAL HIGH (ref 150–400)
RBC: 3.49 MIL/uL — ABNORMAL LOW (ref 4.22–5.81)
RDW: 17.8 % — ABNORMAL HIGH (ref 11.5–15.5)
WBC: 21.9 10*3/uL — ABNORMAL HIGH (ref 4.0–10.5)
nRBC: 0.1 % (ref 0.0–0.2)

## 2021-05-27 LAB — BPAM RBC
Blood Product Expiration Date: 202211062359
Blood Product Expiration Date: 202211062359
ISSUE DATE / TIME: 202210241555
ISSUE DATE / TIME: 202210242013
Unit Type and Rh: 7300
Unit Type and Rh: 7300

## 2021-05-27 LAB — TYPE AND SCREEN
ABO/RH(D): B POS
Antibody Screen: NEGATIVE
Unit division: 0
Unit division: 0

## 2021-05-27 MED ORDER — PANTOPRAZOLE 2 MG/ML SUSPENSION
40.0000 mg | Freq: Two times a day (BID) | ORAL | Status: DC
  Administered 2021-05-27 – 2021-06-05 (×17): 40 mg via ORAL
  Filled 2021-05-27 (×23): qty 20

## 2021-05-27 NOTE — Progress Notes (Signed)
Christopher Henry [phone] please call here . She has questions about plan of care and what stage cancer he has.

## 2021-05-27 NOTE — Progress Notes (Signed)
Nutrition Follow-up  DOCUMENTATION CODES:   Severe malnutrition in context of acute illness/injury, Underweight  INTERVENTION:   Continue Ensure Enlive/Plus po QID, each supplement provides 350 kcal and 13-20 grams of protein. Continue Magic cup TID with meals, each supplement provides 290 kcal and 9 grams of protein. Continue Prosource Plus 30 ml PO BID, each packet provides 100 kcal and 15 gm protein. Continue MVI with minerals daily. Recommend daily weights.  NUTRITION DIAGNOSIS:   Severe Malnutrition related to acute illness (newly diagnosed lung cancer) as evidenced by severe muscle depletion, severe fat depletion, percent weight loss (6% weight loss in one month).  Ongoing   GOAL:   Patient will meet greater than or equal to 90% of their needs  Progressing   MONITOR:   PO intake, Supplement acceptance, Labs  REASON FOR ASSESSMENT:   Consult Poor PO  ASSESSMENT:   Pt with PMH significant for EtOH abuse, tobacco use, HTN, and anxiety admitted with hematochezia, occasional melena, anemia, weight loss, and mediastinal mass concerning for malignancy (biopsied via bronchoscopy, results pending).  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 He has been started on radiotherapy. Scheduled to complete radiation therapy on 07/08/21. EGD colonoscopy on 10/18 revealed prolapsed grade 4 rectal hemorrhoids. Surgery does not recommend surgical interventions for hemorrhoids at this time.   Patient reports that he has been taking the appetite stimulant, but he doesn't think it is working. Encouraged patient to continue taking the appetite stimulant because it may take a couple of weeks to work. He c/o ongoing poor appetite and that his hemorrhoids are what is making it difficult to eat. The more he eats, the more firm his stools are, which makes the hemorrhoids come out and he cannot push them back in.   He is drinking 2-3 Ensure supplements per day and taking ProSource Plus supplement  BID.   Labs reviewed.   Medications reviewed and include Marinol, folic acid, MVI with minerals, Protonix, potassium chloride, Metamucil, thiamine, IV Rocephin. Patient has been refusing the Metamucil for the past several days.  No new weight available since 10/14.  Diet Order:   Diet Order             Diet regular Room service appropriate? Yes; Fluid consistency: Thin  Diet effective now                   EDUCATION NEEDS:   Education needs have been addressed  Skin:  Skin Assessment: Reviewed RN Assessment  Last BM:  10/23  Height:   Ht Readings from Last 1 Encounters:  05/04/2021 6\' 4"  (1.93 m)    Weight:   Wt Readings from Last 1 Encounters:  05/13/2021 61.3 kg    BMI:  Body mass index is 16.45 kg/m.  Estimated Nutritional Needs:   Kcal:  2150-2350  Protein:  105-120 grams  Fluid:  >2L/d    Christopher Henry, RD, LDN, CNSC Please refer to Amion for contact information.

## 2021-05-27 NOTE — Progress Notes (Signed)
Patient ID: Christopher Henry, male   DOB: Jun 14, 1971, 50 y.o.   MRN: [id number] [facility] Progress Note  7 Days Post-Op  Subjective: CC-  Called to evaluate bleeding hemorrhoids again. Hgb 7.4 yesterday, he was given 2 units PRBCs. Hgb 8.9 today. Patient underwent colonoscopy 05/18/2021: Large prolapsed grade IV internal hemorrhoids found on perianal exam CT abdomen/pelvis was obtained 05/25/21 for lung cancer staging and reports Large mass within the low pelvis encasing the distal rectum and herniating through the anus, highly concerning for malignancy. Patient states that he has dealt with hemorrhoids since age 34. He has intermittent prolapse and is able to self reduce. He did not have any issues with bleeding until last month.  Currently tolerating diet and passing flatus, last BM 2 days ago.  Objective: Vital signs in last 24 hours: Temp:  [97.4 F (36.3 C)-99.4 F (37.4 C)] 98.3 F (36.8 C) (10/25 0729) Pulse Rate:  [106-123] 112 (10/25 0729) Resp:  [17-24] 23 (10/25 0729) BP: (102-121)/(75-93) 110/76 (10/25 0729) SpO2:  [95 %-100 %] 100 % (10/25 0734) Last BM Date: 05/25/21  Intake/Output from previous day: 10/24 0701 - 10/25 0700 In: 705.5 [P.O.:60; I.V.:15.5; Blood:630] Out: 350 [Urine:350] Intake/Output this shift: Total I/O In: 480 [P.O.:480] Out: 300 [Urine:300]  PE: Gen:  Alert, NAD, pleasant Pulm:  rate and effort normal Abd: Soft, NT/ND Skin: warm and dry GU: large mass protruding from anus - possible hemorrhoids, no active bleeding    Lab Results:  Recent Labs    05/26/21 0343 05/27/21 0135  WBC 30.6* 21.9*  HGB 7.4* 8.9*  HCT 23.2* 27.4*  PLT 740* 545*   BMET No results for input(s): NA, K, CL, CO2, GLUCOSE, BUN, CREATININE, CALCIUM in the last 72 hours. PT/INR No results for input(s): LABPROT, INR in the last 72 hours. CMP     Component Value Date/Time   NA 135 05/24/2021 0208   K 3.9 05/24/2021 0208   CL 106 05/24/2021 0208   CO2  21 (L) 05/24/2021 0208   GLUCOSE 95 05/24/2021 0208   BUN 9 05/24/2021 0208   CREATININE 1.00 05/24/2021 0208   CALCIUM 8.5 (L) 05/24/2021 0208   PROT 7.6 05/25/2021 1604   ALBUMIN 3.3 (L) 05/10/2021 1604   AST 15 06/01/2021 1604   ALT 8 05/06/2021 1604   ALKPHOS 61 05/07/2021 1604   BILITOT 0.5 05/27/2021 1604   GFRNONAA >60 05/24/2021 0208   GFRAA >60 10/24/2019 0954   Lipase     Component Value Date/Time   LIPASE 29 05/24/2021 1604       Studies/Results: No results found.  Anti-infectives: Anti-infectives (From admission, onward)    Start     Dose/Rate Route Frequency Ordered Stop   05/25/21 1000  fluconazole (DIFLUCAN) tablet 200 mg       See Hyperspace for full Linked Orders Report.   200 mg Oral Daily 05/24/21 0834 06/07/21 0959   05/24/21 1000  fluconazole (DIFLUCAN) tablet 400 mg       See Hyperspace for full Linked Orders Report.   400 mg Oral  Once 05/24/21 0834 05/24/21 0913   05/25/2021 2145  cefTRIAXone (ROCEPHIN) 1 g in sodium chloride 0.9 % 100 mL IVPB        1 g 200 mL/hr over 30 Minutes Intravenous Every 24 hours 05/22/2021 2053 05/25/21 2212   06/01/2021 2145  azithromycin (ZITHROMAX) 500 mg in sodium chloride 0.9 % 250 mL IVPB        500 mg 250 mL/hr over  60 Minutes Intravenous Every 24 hours 05/29/2021 2053 05/11/2021 2322   05/05/2021 1545  ceFEPIme (MAXIPIME) 2 g in sodium chloride 0.9 % 100 mL IVPB        2 g 200 mL/hr over 30 Minutes Intravenous  Once 05/13/2021 1539 06/02/2021 1640        Assessment/Plan Prolapsed grade IV internal hemorrhoids - s/p colonoscopy on 05/07/2021 - CT abdomen/pelvis 05/25/21 for lung cancer staging reports Large mass within the low pelvis encasing the distal rectum and herniating through the anus, highly concerning for malignancy. 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 10/17 Sepsis/post obstructive PNA AKI COPD Severe malnutrition Candida esophagitis ABL anemia   LOS: 11 days    [name], [facility] 05/27/2021, 11:14 AM Please see Amion for pager number during day hours 7:00am-4:30pm

## 2021-05-27 NOTE — Progress Notes (Addendum)
PROGRESS NOTE    Christopher Henry  BDZ:[id number] DOB: [birth year] DOA: 05/05/2021 PCP: Bary Castilla, NP   Chief Complaint  Patient presents with   Shortness of Breath    Brief Narrative/Hospital Course:  Christopher Henry, 50 y.o. male with PMH of COPD, HTN, alcohol abuse, tobacco use 1 pack/day x 35 years sent from pulmonary office to ED for acute respiratory distress hypoxic 80% on room air, nonproductive cough and hematemesis over the past 2 months, painless rectal bleeding, chronic internal hemorrhoids with prolapse, burning sensation on the chest and greater than 50 pound weight loss x12 months.  Upon presentation to the ED, hemoglobin 6.9 K, WBC 30 K, D-dimer 3.5, initial troponin 49 lactic acidosis 2.4 creatinine 1.5 chest x-ray RLL pneumonia. CTA chest no PE, infiltrative mass of the mediastinum, complete collapse of the right lower lobe with associated complete right lower lobe bronchi occlusion due to endobronchial debris/lesion,?  Mass extension into the carina, left to right midline shift due to decreased lung volume-concern for malignancy likely of the mediastinum with extension into or around the right mainstem bronchi leading to collapse of the right lower lobe, mediastinal lymphadenopathy, 1.7 cm groundglass opacity right apex, small to moderate volume pericardial effusion, subacute to chronic fracture of the medial sternal body.  05/09/2021 underwent bronchoscopy which showed large subcarinal mass indenting the carina and bulging bilaterally with large mainstem lesion obscuring 50% of lumen, biopsies taken. Large pericardial effusion noted on 2D echo, seen by cardiology and cardiothoracic surgery, S/p left anterior thoracotomy with drainage of pericardial effusion -open pericardial window by [name] 05/05/2021.  05/07/2021-underwent EGD/colonoscopy found to have prolapsed grade 4 rectal hemorrhoids.  Also started radiation therapy.  Post CT abdomen and pelvis with contrast for staging.   It revealed a large mass within the lower pelvis identified encasing the distal rectum and herniating through the anus highly concerning for malignancy. 05/26/21-Had radiation therapy then was transfused 2 units PRBCs for hemoglobin of 7.4 K.  Hospital course complicated by intermittent rectal bleeding in the setting of internal hemorrhoids with prolapse and possible rectal mass.  Seen by general surgery, no planned for surgical intervention at this time.  MRI pelvis with and without contrast ordered to further assess possible rectal mass seen on CT scan.  Subjective: Reports his pain in the chest is improved, 3 out of 10 from 7-8 out of 10 previously.  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 biopsy showed atypical cells. Rad Onc has seen and radiotherapy started 10/19 urgently.  Second radiation session on 05/26/2021.  Medical oncology input appreciated started initial staging with MRI of the brain with and without-that was normal, Biopsy of pericardium showed metastatic carcinoma.  Malignant large pericardial effusion: S/p open pericardial window by [name] 05/18/2021.  Drain removed 05/22/21 and chest x-ray no pneumothorax.  Pericardial biopsy showed metastatic carcinoma.  Continue colchicine. Pain is currently well controlled on current pain management.  Large mass within the lower pelvis identified encasing the distal rectum and herniated through the anus with concern for malignancy MRI pelvis with and without contrast ordered on 05/27/2021, follow results. Medical oncology and general surgery following.  Chronic blood loss anemia in the setting of chronic internal  hemorrhoids with prolapse and intermittent painless rectal bleeding, malignancy. Transfused 2  units PRBCs on 05/26/2021 for hemoglobin of 7.4 K. Hemoglobin this morning 8.9 K. Continue to monitor H&H.  Chest discomfort status post pericardial window for malignant pericardial effusion Pain is improved with leaning forward. Continue colchicine. Continue as needed narcotic analgesics.  Leukocytosis, suspect multifactorial, including reactive Leukocytosis is downtrending, 21.9 from 30.6. Completed Rocephin for CAP X10 days, last day 05/26/2021. Afebrile. Continue to monitor.  Resolved acute hypoxemic respiratory failure -  was hypoxic 80% on room air with respiratory distress on admission 2/2 pneumonia/mediastinal mass.  Currently on room air with O2 saturation 96%.  Sepsis secondary to pneumonia RLL post obstructive pneumonia: In the setting of malignancy.  Completed azithromycin, on ceftriaxone.  Seen by pulmonary, at this time afebrile although leukocytosis is present.  Leukocytosis is downtrending. Recent Labs  Lab 05/23/21 0053 05/24/21 0208 05/25/21 0128 05/26/21 0343 05/27/21 0135  WBC 26.7* 28.5* 36.8* 30.6* 21.9*  PROCALCITON <0.10 <0.10 0.23  --   --   Hematochezia/melena Acute blood loss anemia in the setting of painless rectal bleeding due to chronic large internal hemorrhoids with prolapse.  Anemia of chronic disease  S/P multiple PRBC transfusions.  Stable  now-Transfuse if less than 7 g.  S/p EGD colonoscopy 10/18-mild esophagitis-positive for Candida esophagitis- large grade 4 prolapsed hemorrhoids-general surgery consulted and advised medical and topical care no plan for surgery at this time- w/ H prep, sitz bath (patient refusing bath).  Transfuse to keep hemoglobin above 8 g may need additional transfusion prior to discharge. Recent Labs  Lab 05/23/21 0053 05/24/21 0208 05/25/21 0128 05/26/21 0343 05/27/21 0135  HGB 8.0* 7.7* 8.4* 7.4* 8.9*  HCT 25.2* 23.9*  26.8* 23.2* 27.4*   Candida esophagitis: Continue p.o. fluconazole  Dysphagia Having trouble swallowing his p.o. medications Feeling of food getting stuck in his throat. Speech therapist consulted for swallow evaluation. Aspiration precautions in place.  Hypomagnesemia repleted intravenously, repeat magnesium level in the morning.  Resolved AKI in the setting of dehydration/pneumonia/malignancy.  Resolved  Non Anion gap metabolic acidosis due to AKI:  Serum bicarb 21, anion gap of 8. Repeat BMP in the morning.  Resolved post repletion: Hypokalemia  Possible COPD:  Currently not in exacerbation. Continue Dulera,Incruse Ellipta and bronchodilators as needed  Severe protein calorie malnutrition with significant weight loss and BMI 16.45.   Severe muscle mass loss. Albumin 3.3 on 05/14/2021. In the setting of malignancy overall poor prognostic indicator. Appreciate dietitian's assistance. Continue Marinol Continue to encourage increase oral protein calorie intake.  History of alcohol use/tobacco use patient reports he recently quit.  Continue thiamine folic acid multivitamins.  Deconditioning:  Evaluated by PT recommendation for home health PT. Continue PT OT with assistance    Nutrition Problem: Severe Malnutrition Etiology: acute illness (newly diagnosed lung cancer) Signs/Symptoms: severe muscle depletion, severe fat depletion, percent weight loss (6% weight loss in one month) Percent weight loss: 6 % Interventions: MVI, Magic cup, Ensure Enlive (each supplement provides 350kcal and 20 grams of protein)  Thrombocytosis, improving, suspect in the setting of acute illness. Platelet count downtrending 545 K from 740 K. Continue to monitor.  Critical care time: 65 minutes.    DVT prophylaxis: SCDs. Code Status:   Code Status: Full Code Family Communication: Updated his wife at bedside.  Status is: Inpatient Remains inpatient appropriate because: Ongoing management  of anemia, pericardial effusion, lung mass pneumonia. Anticipate discharge 1-2 days once staging completed and okay with radio-onc/oncology  Objective: Vitals: Today's Vitals   05/27/21 0734 05/27/21 0900 05/27/21 1130 05/27/21 1333  BP:   111/76 118/85  Pulse:   (!) 109 (!) 103  Resp:   (!) 23 (!) 22  Temp:   98.1 F (36.7 C) 98.4 F (36.9 C)  TempSrc:   Oral Oral  SpO2: 100%  100% 96%  Weight:      PainSc:  2      Physical Examination: General exam: Very frail-appearing in no acute distress.  He is alert and oriented x3.   HEENT: Oral mucosa is moist ear and nose within normal limit.  Respiratory system: Mild rales at bases no wheezing noted.  Poor inspiratory effort.   Cardiovascular system: Regular rate and rhythm no rubs or gallops.   Gastrointestinal system: Soft nontender normal bowel sounds present.   Nervous System: Alert and awake.  Nonfocal exam.   Extremities: No edema, dorsalis pedis pulses present.   Skin: No rashes, no icteric. MSK: No lower extremity edema bilaterally.  Medications reviewed:  Scheduled Meds:  (feeding supplement) PROSource Plus  30 mL Oral BID BM   colchicine  0.3 mg Oral Daily   dronabinol  2.5 mg Oral BID AC   feeding supplement  237 mL Oral QID   fluconazole  200 mg Oral Daily   folic acid  1 mg Oral Daily   hydrocortisone  25 mg Rectal BID   hydrocortisone-pramoxine  1 applicator Rectal BID   mometasone-formoterol  2 puff Inhalation BID   And   umeclidinium bromide  1 puff Inhalation Daily   multivitamin with minerals  1 tablet Oral Daily   pantoprazole sodium  40 mg Oral BID   potassium chloride  20 mEq Oral Daily   psyllium  1 packet Oral Daily   thiamine  100 mg Oral Daily   Or   thiamine  100 mg Intravenous Daily   traMADol  50 mg Oral Q6H   Continuous Infusions:  sodium chloride      Diet Order             Diet regular Room service appropriate? Yes; Fluid consistency: Thin  Diet effective now                    Nutrition Problem: Severe Malnutrition Etiology: acute illness (newly diagnosed lung cancer) Signs/Symptoms: severe muscle depletion, severe fat depletion, percent weight loss (6% weight loss in one month) Percent weight loss: 6 % Interventions: MVI, Magic cup, Ensure Enlive (each supplement provides 350kcal and 20 grams of protein)  Intake/Output  Intake/Output Summary (Last 24 hours) at 05/27/2021 1353 Last data filed at 05/27/2021 1017 Gross per 24 hour  Intake 1185.5 ml  Output 300 ml  Net 885.5 ml   Intake/Output from previous day: 10/24 0701 - 10/25 0700 In: 705.5 [P.O.:60; I.V.:15.5; Blood:630] Out: 350 [Urine:350] Net IO Since Admission: 4,068.64 mL [05/27/21 1353]   Weight change:   Wt Readings from Last 3 Encounters:  05/15/2021 61.3 kg  05/21/2021 59.4 kg  04/24/21 60.3 kg    Consultants:see note  Procedures:see note Antimicrobials: Anti-infectives (From admission, onward)    Start     Dose/Rate Route Frequency Ordered Stop   05/25/21 1000  fluconazole (DIFLUCAN) tablet 200 mg       See Hyperspace for full Linked Orders Report.   200 mg Oral Daily 05/24/21 0834 06/07/21 0959   05/24/21 1000  fluconazole (DIFLUCAN) tablet 400 mg       See Hyperspace for full Linked Orders Report.   400 mg Oral  Once 05/24/21 5364 05/24/21 0913  05/10/2021 2145  cefTRIAXone (ROCEPHIN) 1 g in sodium chloride 0.9 % 100 mL IVPB        1 g 200 mL/hr over 30 Minutes Intravenous Every 24 hours 05/15/2021 2053 05/25/21 2212   05/18/2021 2145  azithromycin (ZITHROMAX) 500 mg in sodium chloride 0.9 % 250 mL IVPB        500 mg 250 mL/hr over 60 Minutes Intravenous Every 24 hours 05/24/2021 2053 05/12/2021 2322   05/13/2021 1545  ceFEPIme (MAXIPIME) 2 g in sodium chloride 0.9 % 100 mL IVPB        2 g 200 mL/hr over 30 Minutes Intravenous  Once 06/02/2021 1539 05/05/2021 1640      Culture/Microbiology    Component Value Date/Time   SDES BLOOD LEFT ANTECUBITAL 06/02/2021 1604   SPECREQUEST   05/26/2021 1604    BOTTLES DRAWN AEROBIC AND ANAEROBIC Blood Culture results may not be optimal due to an inadequate volume of blood received in culture bottles   CULT  05/15/2021 1604    NO GROWTH 5 DAYS Performed at [facility], [place] [address] [place], [place] [address]    REPTSTATUS 05/21/2021 FINAL 05/23/2021 1604    Other culture-see note  Unresulted Labs (From admission, onward)    None      Data Reviewed: I have personally reviewed following labs and imaging studies CBC: Recent Labs  Lab 05/23/21 0053 05/24/21 0208 05/25/21 0128 05/26/21 0343 05/27/21 0135  WBC 26.7* 28.5* 36.8* 30.6* 21.9*  HGB 8.0* 7.7* 8.4* 7.4* 8.9*  HCT 25.2* 23.9* 26.8* 23.2* 27.4*  MCV 79.7* 79.7* 79.5* 77.3* 78.5*  PLT 642* 644* 775* 740* 106*   Basic Metabolic Panel: Recent Labs  Lab 05/21/21 0138 05/22/21 0207 05/23/21 0053 05/24/21 0208  NA 138 137 138 135  K 3.2* 3.4* 3.8 3.9  CL 112* 109 109 106  CO2 19* 20* 21* 21*  GLUCOSE 111* 95 110* 95  BUN 18 12 10 9   CREATININE 1.09 0.97 1.15 1.00  CALCIUM 8.2* 8.4* 8.6* 8.5*  MG  --   --  1.5*  --    GFR: Estimated Creatinine Clearance: 76.6 mL/min (by C-G formula based on SCr of 1 mg/dL). Liver Function Tests: No results for input(s): AST, ALT, ALKPHOS, BILITOT, PROT, ALBUMIN in the last 168 hours.  No results for input(s): LIPASE, AMYLASE in the last 168 hours.  No results for input(s): AMMONIA in the last 168 hours.  Coagulation Profile: No results for input(s): INR, PROTIME in the last 168 hours.  Cardiac Enzymes: No results for input(s): CKTOTAL, CKMB, CKMBINDEX, TROPONINI in the last 168 hours. BNP (last 3 results) No results for input(s): PROBNP in the last 8760 hours. HbA1C: No results for input(s): HGBA1C in the last 72 hours. CBG: No results for input(s): GLUCAP in the last 168 hours. Lipid Profile: No results for input(s): CHOL, HDL, LDLCALC, TRIG, CHOLHDL, LDLDIRECT in the last 72 hours. Thyroid  Function Tests: No results for input(s): TSH, T4TOTAL, FREET4, T3FREE, THYROIDAB in the last 72 hours. Anemia Panel: No results for input(s): VITAMINB12, FOLATE, FERRITIN, TIBC, IRON, RETICCTPCT in the last 72 hours.  Sepsis Labs: Recent Labs  Lab 05/23/21 0053 05/24/21 0208 05/25/21 0128  PROCALCITON <0.10 <0.10 0.23    No results found for this or any previous visit (from the past 240 hour(s)).     Radiology Studies: No results found.   LOS: 11 days   Kayleen Memos, MD [facility]  05/27/2021, 1:53 PM

## 2021-05-27 NOTE — Progress Notes (Signed)
Pt uncooperative and kept moving. When asked if he was ok he said the "Funky A@% noise" was causing him to move. Pt sent back to room and MRI not preformed.

## 2021-05-27 NOTE — TOC Initial Note (Addendum)
Transition of Care [facility]) - Initial/Assessment Note    Patient Details  Name: Christopher Henry MRN: [id number] Date of Birth: Feb 08, 1971  Transition of Care [facility]) CM/SW Contact:    Carles Collet, RN Phone Number: 05/27/2021, 3:02 PM  Clinical Narrative:         Damaris Schooner w patient over the phone. He states that he is from home, lives with his fiance. They both work rotating shifts, he would have intermittent supervision assistance at DC.     Patient will have 3/1, RW, WC delivered to room either today or tomorrow (tentatively identified as a potential DC in next 1-2 days during progression meeting, MD states it would be pending MRI results, patient could not tolerate MRI this afternoon). Will begin pursuit for [facility] provider. Will be limited due to payor source. [facility] could not accept due to staffing. [facility] accepted. [facility] could not accept due to payor.   Palliative meeting ordered. Will also follow for recommendations.         Expected Discharge Plan:  (TBD) Barriers to Discharge: Continued Medical Work up   Patient Goals and CMS Choice        Expected Discharge Plan and Services Expected Discharge Plan:  (TBD)   Discharge Planning Services: CM Consult   Living arrangements for the past 2 months: Single Family Home                 DME Arranged: 3-N-1, Walker rolling, Wheelchair manual DME Agency: [employer] Date DME Agency Contacted: 05/26/21 Time DME Agency Contacted: [phone] Representative spoke with at DME Agency: Freda Munro            Prior Living Arrangements/Services Living arrangements for the past 2 months: Single Family Home Lives with:: Domestic Partner                   Activities of Daily Living Home Assistive Devices/Equipment: None ADL Screening (condition at time of admission) Patient's cognitive ability adequate to safely complete daily activities?: Yes Is the patient deaf or have difficulty hearing?: No Does the patient have difficulty seeing,  even when wearing glasses/contacts?: No Does the patient have difficulty concentrating, remembering, or making decisions?: No Patient able to express need for assistance with ADLs?: Yes Does the patient have difficulty dressing or bathing?: No Independently performs ADLs?: Yes (appropriate for developmental age) Does the patient have difficulty walking or climbing stairs?: No Weakness of Legs: None Weakness of Arms/Hands: None  Permission Sought/Granted                  Emotional Assessment              Admission diagnosis:  Acute respiratory failure with hypoxia ([place]) [J96.01] Patient Active Problem List   Diagnosis Date Noted   Non-small cell lung cancer with metastasis ([place]) 05/23/2021   Protein-calorie malnutrition, severe 05/22/2021   Malignant pericardial effusion    Acute blood loss anemia    Acute hypoxemic respiratory failure ([place]) 05/26/2021   COPD with acute exacerbation ([place]) 05/10/2021   History of hemoptysis 05/21/2021   Symptomatic anemia 05/27/2021   Mediastinal mass 05/30/2021   AKI (acute kidney injury) ([place]) 06/02/2021   Gastrointestinal hemorrhage 05/27/2021   Sepsis due to pneumonia ([place]) 05/21/2021   COPD (chronic obstructive pulmonary disease) ([place]) 05/31/2021   Anxiety state, unspecified 11/09/2012   Internal hemorrhoids with prolapse 11/09/2012   PCP:  Bary Castilla, NP Pharmacy:   [place], [address]  [place] [address] Phone: [phone] Fax: [phone]     Social Determinants of Health (SDOH) Interventions    Readmission Risk Interventions No flowsheet data found.

## 2021-05-27 NOTE — Progress Notes (Signed)
Occupational Therapy Treatment Patient Details Name: Christopher Henry MRN: [id number] DOB: [birth year] Today's Date: 05/27/2021   History of present illness The pt is a 50 yo male presenting 10/14 due to SOB with hypoxia. Pt also unintentional weight loss and hemoptysis x2.  Pt found to have pericardial effusion in the setting of locally advanced lung cancer as well as prolapsed grade IV hemorrhoids. Underwent pericardial window 10/17. s/p EGD on 10/18, initiation of radiation on 10/18. CT on 10/23 showed large pelvic mass.  PMH includes: tobacco use (x35 years) and HTN.   OT comments  Patient received in bed and agreeable to OT treatment.  Patient was able to get to eob but requied extra time before attempting to stand.  Patient ambulated to sink for grooming and required seated rest break. Patient loaded toothbrush seated and brushed teeth standing. Patient completed grooming tasks seated and asked to return to bed. Patient continues to require frequent rest breaks.  Acute OT to continue to follow.    Recommendations for follow up therapy are one component of a multi-disciplinary discharge planning process, led by the attending physician.  Recommendations may be updated based on patient status, additional functional criteria and insurance authorization.    Follow Up Recommendations  Home health OT    Assistance Recommended at Discharge    Equipment Recommendations       Recommendations for Other Services      Precautions / Restrictions Precautions Precautions: Fall       Mobility Bed Mobility Overal bed mobility: Modified Independent Bed Mobility: Supine to Sit;Sit to Supine     Supine to sit: Modified independent (Device/Increase time)     General bed mobility comments: required rest break once on eob before attempting to stand    Transfers Overall transfer level: Needs assistance Equipment used: Rolling walker (2 wheels) Transfers: Sit to/from [facility] Sit  to Stand: Min guard Stand pivot transfers: Min guard         General transfer comment: able to stand and transfer, min guard for safety due to low activity tolerance     Balance Overall balance assessment: Needs assistance Sitting-balance support: Feet supported;No upper extremity supported Sitting balance-Leahy Scale: Good     Standing balance support: Bilateral upper extremity supported;During functional activity Standing balance-Leahy Scale: Poor Standing balance comment: able to use one extremity to brush teeth while standing.                           ADL either performed or assessed with clinical judgement   ADL Overall ADL's : Needs assistance/impaired     Grooming: Wash/dry hands;Oral care;Sitting;Standing Grooming Details (indicate cue type and reason): Patient performed washing hands and face seated and stood for oral care                             Functional mobility during ADLs: Min guard;Rolling walker (2 wheels) General ADL Comments: frequent rest breaks required, from supine to sit, ambulating to bathroom, and following oral care     Vision       Perception     Praxis      Cognition Arousal/Alertness: Awake/alert Behavior During Therapy: Flat affect Overall Cognitive Status: Within Functional Limits for tasks assessed  General Comments: Patient willing to participate          Exercises     Shoulder Instructions       General Comments      Pertinent Vitals/ Pain       Pain Assessment: Faces Faces Pain Scale: Hurts even more Pain Location: right ankle Pain Descriptors / Indicators: Discomfort;Grimacing Pain Intervention(s): Repositioned  Home Living                                          Prior Functioning/Environment              Frequency  Min 2X/week        Progress Toward Goals  OT Goals(current goals can now be found in the care  plan section)  Progress towards OT goals: Progressing toward goals  Acute Rehab OT Goals OT Goal Formulation: With patient Time For Goal Achievement: 06/07/21 Potential to Achieve Goals: Fair ADL Goals Pt Will Perform Grooming: sitting;with modified independence Pt Will Perform Lower Body Bathing: with modified independence;sit to/from stand Pt Will Perform Lower Body Dressing: with modified independence;sit to/from stand;with adaptive equipment Pt Will Transfer to Toilet: with modified independence;ambulating;regular height toilet Pt Will Perform Toileting - Clothing Manipulation and hygiene: with modified independence;sitting/lateral leans  Plan Discharge plan remains appropriate    Co-evaluation                 AM-PAC OT "6 Clicks" Daily Activity     Outcome Measure   Help from another person eating meals?: A Little Help from another person taking care of personal grooming?: A Little Help from another person toileting, which includes using toliet, bedpan, or urinal?: A Little Help from another person bathing (including washing, rinsing, drying)?: A Little Help from another person to put on and taking off regular upper body clothing?: A Little Help from another person to put on and taking off regular lower body clothing?: A Little 6 Click Score: 18    End of Session Equipment Utilized During Treatment: Rolling walker (2 wheels)  OT Visit Diagnosis: Unsteadiness on feet (R26.81);Muscle weakness (generalized) (M62.81);Pain Pain - Right/Left: Right Pain - part of body: Ankle and joints of foot   Activity Tolerance Patient limited by fatigue   Patient Left in bed;with call bell/phone within reach   Nurse Communication Mobility status        Time: 6945-0388 OT Time Calculation (min): 22 min  Charges: OT General Charges $OT Visit: 1 Visit OT Treatments $Self Care/Home Management : 8-22 mins  Lodema Hong, [place]  Pager  [phone] Office [place] 05/27/2021, 10:39 AM

## 2021-05-28 ENCOUNTER — Ambulatory Visit
Admit: 2021-05-28 | Discharge: 2021-05-28 | Disposition: A | Discharge status: Home / Self Care | Payer: 59 | Attending: Radiation Oncology | Admitting: Radiation Oncology

## 2021-05-28 DIAGNOSIS — Z66 Do not resuscitate: Secondary | ICD-10-CM | POA: Diagnosis not present

## 2021-05-28 DIAGNOSIS — Z7189 Other specified counseling: Secondary | ICD-10-CM

## 2021-05-28 DIAGNOSIS — Z515 Encounter for palliative care: Secondary | ICD-10-CM

## 2021-05-28 DIAGNOSIS — C3490 Malignant neoplasm of unspecified part of unspecified bronchus or lung: Secondary | ICD-10-CM | POA: Diagnosis not present

## 2021-05-28 LAB — CBC
HCT: 26.7 % — ABNORMAL LOW (ref 39.0–52.0)
Hemoglobin: 8.6 g/dL — ABNORMAL LOW (ref 13.0–17.0)
MCH: 25.5 pg — ABNORMAL LOW (ref 26.0–34.0)
MCHC: 32.2 g/dL (ref 30.0–36.0)
MCV: 79.2 fL — ABNORMAL LOW (ref 80.0–100.0)
Platelets: 502 10*3/uL — ABNORMAL HIGH (ref 150–400)
RBC: 3.37 MIL/uL — ABNORMAL LOW (ref 4.22–5.81)
RDW: 18.7 % — ABNORMAL HIGH (ref 11.5–15.5)
WBC: 22.2 10*3/uL — ABNORMAL HIGH (ref 4.0–10.5)
nRBC: 0.1 % (ref 0.0–0.2)

## 2021-05-28 LAB — COMPREHENSIVE METABOLIC PANEL
ALT: 476 U/L — ABNORMAL HIGH (ref 0–44)
AST: 278 U/L — ABNORMAL HIGH (ref 15–41)
Albumin: 2.5 g/dL — ABNORMAL LOW (ref 3.5–5.0)
Alkaline Phosphatase: 389 U/L — ABNORMAL HIGH (ref 38–126)
Anion gap: 10 (ref 5–15)
BUN: 33 mg/dL — ABNORMAL HIGH (ref 6–20)
CO2: 22 mmol/L (ref 22–32)
Calcium: 8.6 mg/dL — ABNORMAL LOW (ref 8.9–10.3)
Chloride: 106 mmol/L (ref 98–111)
Creatinine, Ser: 1.58 mg/dL — ABNORMAL HIGH (ref 0.61–1.24)
GFR, Estimated: 53 mL/min — ABNORMAL LOW (ref >60)
Glucose, Bld: 91 mg/dL (ref 70–99)
Potassium: 4.8 mmol/L (ref 3.5–5.1)
Sodium: 138 mmol/L (ref 135–145)
Total Bilirubin: 1.1 mg/dL (ref 0.3–1.2)
Total Protein: 6.5 g/dL (ref 6.5–8.1)

## 2021-05-28 LAB — PHOSPHORUS: Phosphorus: 3.6 mg/dL (ref 2.5–4.6)

## 2021-05-28 LAB — MAGNESIUM: Magnesium: 2.2 mg/dL (ref 1.7–2.4)

## 2021-05-28 MED ORDER — LORAZEPAM 2 MG/ML IJ SOLN
1.0000 mg | Freq: Once | INTRAMUSCULAR | Status: DC
  Filled 2021-05-28 (×2): qty 1

## 2021-05-28 NOTE — Progress Notes (Signed)
PT Cancellation Note  Patient Details Name: Christopher Henry MRN: [id number] DOB: [birth year]   Cancelled Treatment:    Reason Eval/Treat Not Completed: Fatigue/lethargy limiting ability to participate; pt states, "I don't want to" despite encouragement and education on importance of mobility. Will follow-up for PT treatment as schedule permits.  Mabeline Caras, PT, DPT Acute Rehabilitation Services  Pager [phone] Office [place] 05/28/2021, 1:25 PM

## 2021-05-28 NOTE — Progress Notes (Signed)
MRI dept contacted regarding order placed yesterday.  Pt went down for scan yesterday but apparently was combative and not cooperating and test was not completed.  Palliative care has been in with pt this morning and had extensive discussion regarding plan of care, code status, etc. Decision made for DNR, but pt still desires to know options and understands that MRI results. will help guide these decisions.  MRI department aware and also notified that pt will be transferred to [facility] for radiation at 1:15pm.  They will be calling me back to let me know if they will be able to work him in today.

## 2021-05-28 NOTE — Progress Notes (Addendum)
Pt seen with fiance at bedside.  Pt now states he wishes to be a full code.  Did inform him and fiance that if we had to perform CPR on him in setting of stage 4 lung cancer, chance of leaving hospital alive would be very low.  Did discuss that full code = CPR with chest compressions, breaking ribs, life support on ventilator, etc.  Pt does inform me he still wants to be a full code at this time.  Will change to full code in computer.

## 2021-05-28 NOTE — Progress Notes (Signed)
Pt back to 4E s/p radiation at [facility].

## 2021-05-28 NOTE — Progress Notes (Signed)
I called and spoke with the patient's fianc Ms. Weathers.  She and I had been playing telephone tag regarding the communication of his biopsy results.  I conveyed that it appears that he has metastatic disease from his lung cancer and updated her on the findings from his CT scan imaging and the need for MRI imaging of his pelvis.  We have continued with radiotherapy and he has had 6 total treatments to date.  With the findings of his pericardial biopsy being positive, Dr. Lisbeth Renshaw is contemplating a shorter course of therapy though we were hoping originally to give a more definitive course.  She we will plan to accompany him for his undertreatment visit on Friday immediately following his treatment to make more definitive plans.  She is also aware of the need for an outpatient PET scan to assess extent of disease and the plans to continue to follow-up with Dr. Irene Limbo in medical oncology for neck steps in his overall treatment management.

## 2021-05-28 NOTE — Consult Note (Addendum)
Palliative Medicine Inpatient Consult Note  Consulting Provider: Kayleen Memos, DO  Reason for consult:   Christopher Henry Palliative Medicine Consult  Reason for Consult? to assist with establlishing goals of care   HPI:  Per intake H&P --> Christopher Henry, 50 y.o. male with PMH of COPD, HTN, alcohol abuse, tobacco use 1 pack/day x 35 years sent from pulmonary office to ED for acute respiratory distress hypoxic 80% on room air, nonproductive cough and hematemesis over the past 2 months, painless rectal bleeding, chronic internal hemorrhoids with prolapse, burning sensation on the chest and greater than 50 pound weight loss x12 months. Identified to have metastatic non-small cell lung cancer. Oncology is involved he receives radiation with Dr. Lisbeth Henry. The Oncology team is considering him for additional therapies.  Palliative care has been asked to get involved to discuss goals of care in light of this diagnosis.   Clinical Assessment/Goals of Care:  *Please note that this is a verbal dictation therefore any spelling or grammatical errors are due to the "[place]" system interpretation.  I have reviewed medical records including EPIC notes, labs and imaging, received report from bedside RN, assessed the patient who is resting in bed.    I met with Christopher Henry to further discuss diagnosis prognosis, GOC, EOL wishes, disposition and options.  We reviewed Christopher Henry's healthcare journey up until presently. He shares that in July he was treated for an episode of bronchitis. He expresses that he remained to have difficulty with his "chest bother me" and started to cough up blood. He states that he received  "more antibiotics" though these did not help him. He expresses that he knew :something was not right" and presented back to the ER with abnormalities on his CT imaging He has since been identified to have metastatic NSCLC. This news is shocking to him. He expresses that he does  not know the details of the staging at this time. He shares that another complicating factor is a a large pelvic mass which he "does not know what it is." We reviewed that this is quite likely in relationship to his cancer.   We reviewed that Christopher Henry is having some troubling hemorrhoids at this time causing his discomfort and bleeding.    I introduced Palliative Medicine as specialized medical care for people living with serious illness. It focuses on providing relief from the symptoms and stress of a serious illness. The goal is to improve quality of life for both the patient and the family.  Christopher Henry shares with me that he is from [place] [facility] originally. He has lived in the [place] since 2002. He is not married. He presently has a fiance, Christopher Henry who he plans on marrying. He has four children, two sons and two daughters who remain in [place]. He works as a Freight forwarder. He enjoys spending time with his fiance. He considers himself a man of faith and shares that he is christian.  Prior to hospitalization Christopher Henry had been living on his own and was able to complete all bADLs and IADLS on his own. He was still working. He did not use any devices for mobility.  From a nutritional perspective. Christopher Henry complains of no appetite at this time. He states that he will eat what is given but has no desire for it.   A detailed discussion was had today regarding advanced directives, patient has never completed these though would be interested in doing so while he is present.  He more specifically would like to designate his fiancee Christopher Henry as his primary Media planner.   Concepts specific to code status, artifical feeding and hydration, continued IV antibiotics and rehospitalization was had.  Christopher Henry expressed that he knows if his heart were to stop given his weight loss and "thin" state that he would be unlikely to survive a cardiopulmonary resuscitation event. I confirmed verbally multiple times that he would  wish to be a DNAR/DNI which he is in agreement with.   Christopher Henry goals presently are to learn more about his disease process and treatment options. He would like to pursue the treatments that are offered.   In terms of death and dying if it got to that point [name] expresses that he would "want it to be quick."  Discussed the importance of continued conversation with family and their  medical providers regarding overall plan of care and treatment options, ensuring decisions are within the context of the patients values and GOCs.  Decision Maker: Christopher Henry  SUMMARY OF RECOMMENDATIONS   DNAR/DNI  Patient provided a MOST form for review  Provided advance directives for completion  An open and honest conversation was had in relationship to patients cancer. He shares disbelief at the rapidity for which things have evolved. He would like to speak further with the Oncology team moving forward to better understand his options.   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, manufacturing and needs support processing  Ongoing PMT support  Code Status/Advance Care Planning: DNAR/DNI   Symptom Management:  Pain d/t metastatic disease: - Morphine $RemoveBe'2mg'hUacbMBQD$  IV Q2H PRN - Tramadol $RemoveBe'50mg'jRuQQMqpD$  Q6H ATC (patient may refuse)  Adult FTT d/t metastatic disease: - Marinol 2.$RemoveBef'5mg'HtQApRqzaN$  PO BID  Hemorrhoids: - Annusol BID - Proctofoam BID  Insomnia: - Melatonin $RemoveBef'5mg'qgweSpPCyU$  PO QHS PRN   Palliative Prophylaxis:  Oral Care, Mobility  Additional Recommendations (Limitations, Scope, Preferences): Continue current scope of care.   Psycho-social/Spiritual:  Desire for further Chaplaincy support: Yes - Christian Additional Recommendations: Education on progression of cancer.   Prognosis: Very worrisome given the rapidity of decline and severe frailty, hypoalbuminemia, metastatic disease.   Discharge Planning: Unclear  Vitals:   05/27/21 2336 05/28/21 0432  BP: 102/77 105/75  Pulse: (!) 107 97  Resp: 20 20  Temp:  98.4 F (36.9 C) 98 F (36.7 C)  SpO2: 98% 92%    Intake/Output Summary (Last 24 hours) at 05/28/2021 0631 Last data filed at 05/27/2021 1300 Gross per 24 hour  Intake 960 ml  Output 300 ml  Net 660 ml   Last Weight  Most recent update: 05/25/2021  7:52 PM    Weight  61.3 kg (135 lb 2.3 oz)            Gen:  Middle aged M in NAD HEENT: moist mucous membranes CV: Irregular rate PULM: On RA ABD: soft/nontender EXT: No edema Neuro: Alert and oriented x3  PPS: 50%   This conversation/these recommendations were discussed with patient primary care team, Dr. Dwyane Dee  Time In: 0700 Time Out: 0830 Total Time: 90 minutes Greater than 50%  of this time was spent counseling and coordinating care related to the above assessment and plan.  [place] Team Team Cell Phone: [phone] Please utilize secure chat with additional questions, if there is no response within 30 minutes please call the above phone number  Palliative Medicine Team providers are available by phone from 7am to 7pm daily and can be reached through the team  cell phone.  Should this patient require assistance outside of these hours, please call the patient's attending physician.   

## 2021-05-28 NOTE — Progress Notes (Signed)
PROGRESS NOTE    Christopher Henry  UTM:[id number] DOB: [birth year] DOA: 05/26/2021 PCP: Bary Castilla, NP   Brief Narrative:  This 50 y.o. male with PMH of COPD, HTN, alcohol abuse, tobacco use 1 pack/day x 35 years sent from pulmonary office to ED for acute respiratory distress hypoxic 80% on room air, nonproductive cough and hematemesis over the past 2 months, painless rectal bleeding, chronic internal hemorrhoids with prolapse, burning sensation on the chest and greater than 50 pound weight loss x12 months.  Upon presentation to the ED, hemoglobin 6.9 K, WBC 30 K, D-dimer 3.5, initial troponin 49 lactic acidosis 2.4 creatinine 1.5 chest x-ray RLL pneumonia. CTA chest no PE, infiltrative mass of the mediastinum, complete collapse of the right lower lobe with associated complete right lower lobe bronchi occlusion due to endobronchial debris/lesion,?  Mass extension into the carina, left to right midline shift due to decreased lung volume-concern for malignancy likely of the mediastinum with extension into or around the right mainstem bronchi leading to collapse of the right lower lobe, mediastinal lymphadenopathy, 1.7 cm groundglass opacity right apex, small to moderate volume pericardial effusion, subacute to chronic fracture of the medial sternal body.   05/31/2021 underwent bronchoscopy which showed large subcarinal mass indenting the carina and bulging bilaterally with large mainstem lesion obscuring 50% of lumen, biopsies taken. Large pericardial effusion noted on 2D echo, seen by cardiology and cardiothoracic surgery, S/p left anterior thoracotomy with drainage of pericardial effusion -open pericardial window by [name] 05/27/2021.  05/24/2021-underwent EGD/colonoscopy found to have prolapsed grade 4 rectal hemorrhoids.  Also started radiation therapy.  Post CT abdomen and pelvis with contrast for staging.  It revealed a large mass within the lower pelvis identified encasing the distal rectum and  herniating through the anus highly concerning for malignancy. 05/26/21-Had radiation therapy then was transfused 2 units PRBCs for hemoglobin of 7.4 K.   Hospital course complicated by intermittent rectal bleeding in the setting of internal hemorrhoids with prolapse and possible rectal mass.  Seen by general surgery, no planned for surgical intervention at this time.  MRI pelvis with and without contrast ordered to further assess possible rectal mass seen on CT scan.   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 Patient presented with large subcarinal/mediastinal mass extending into the carina, Mediastinal lymphadenopathy with right upper lobe nodule, Right lower lobe atelectasis with endobronchial lesion: S/p bronchoscopy 10/15- mass potential to cause central airway obstruction. biopsy showed atypical cells. Rad Onc has seen and radiotherapy started  on 10/19 urgently.  Second radiation session on 05/26/2021.  Medical oncology input appreciated started initial staging with MRI of the brain with and without-that was normal, Biopsy of pericardium showed metastatic carcinoma. Palliative care consulted to discuss goals of care.  Patient wants to treat the treatable otherwise he does not want resuscitation and intubation.   Malignant large pericardial effusion.: S/p open pericardial window by [name] 05/06/2021.   Drain removed on 05/22/21 and chest x-ray no pneumothorax.   Pericardial biopsy showed metastatic carcinoma.   Continue  colchicine. Pain is currently well controlled on current pain management.   Pelvic mass: Large mass within the lower pelvis identified encasing the distal rectum and herniated through the anus with concern for malignancy. MRI pelvis with and without contrast ordered on 05/27/2021, follow results. Medical oncology and general surgery following.   Chronic blood loss anemia in the setting of chronic internal hemorrhoids. Transfused 2 units PRBCs on 05/26/2021 for hemoglobin of 7.4 K. Continue to monitor H&H.  Hemoglobin today morning 8.6.   Chest discomfort: S/p pericardial window for malignant pericardial effusion Pain is improved with leaning forward. Continue colchicine. Continue as needed narcotic analgesics.   Leukocytosis suspect multifactorial.   Leukocytosis is downtrending, 21.9 from 30.6. Completed Rocephin for CAP X10 days, last day 05/26/2021. Remains afebrile. Continue to monitor.   Acute hypoxic respiratory failure > resolved. He was hypoxic 80% on room air with respiratory distress on admission 2/2 pneumonia/mediastinal mass.  Currently on room air with O2 saturation 96%.   Sepsis secondary to pneumonia /postobstructive pneumonia: In the setting of malignancy.  Completed antibiotics course.  Hematochezia/melena Acute blood loss anemia in the setting of painless rectal bleeding due to chronic large internal hemorrhoids with prolapse.   Anemia of chronic disease  S/P multiple PRBC transfusions.  Hb. Stable now. Transfuse if less than 7 g.   S/p EGD colonoscopy 10/18-mild esophagitis-positive for Candida esophagitis- large grade 4 prolapsed hemorrhoids-general surgery consulted and advised medical and topical care no plan for surgery at this time- w/ H prep, sitz bath (patient refusing bath).  Transfuse to keep hemoglobin above 8 g may need additional transfusion prior to discharge.   Candida esophagitis: Continue fluconazole   Dysphagia Having trouble  swallowing his p.o. medications. Feeling of food getting stuck in his throat. Speech therapist consulted for swallow evaluation. Aspiration precautions in place.   Hypomagnesemia: Replaced and resolved.   AKI > resolved   COPD: Currently not in exacerbation. Continue Dulera,Incruse Ellipta and bronchodilators as needed   Severe protein calorie malnutrition with significant weight loss and BMI 16.45.   Severe muscle mass loss. Albumin 3.3 on 05/03/2021. In the setting of malignancy overall poor prognostic indicator. Appreciate dietitian's assistance. Continue Marinol Continue to encourage increase oral protein calorie intake.   History of alcohol use/tobacco use : Continue thiamine folic acid multivitamins.   Deconditioning:  Evaluated by PT recommendation for home health PT. Continue PT OT with assistance      Nutrition Problem: Severe Malnutrition Etiology: acute illness (newly diagnosed lung cancer) Signs/Symptoms: severe muscle depletion, severe fat depletion, percent weight loss (6% weight loss in one month) Percent weight loss: 6 % Interventions: MVI, Magic cup, Ensure Enlive (each supplement provides 350kcal and 20 grams of protein)   Thrombocytosis, improving, suspect in the setting of acute illness. Platelet count downtrending 545 K from 740 K. Continue to monitor.  DVT prophylaxis: SCDs Code Status: DNR Family Communication: No family at bed side. Disposition Plan:   Status is: Inpatient  Remains inpatient appropriate because: Needs work-up for malignancy.   Anticipated discharge in 3 days.  Consultants:  Palliative care Oncology General surgery  Procedures:  Antimicrobials:   Anti-infectives (From admission, onward)    Start     Dose/Rate Route Frequency Ordered Stop   05/25/21 1000  fluconazole (DIFLUCAN) tablet 200 mg       See Hyperspace for full Linked Orders Report.   200 mg Oral Daily 05/24/21 0834 06/07/21 0959   05/24/21 1000   fluconazole (DIFLUCAN) tablet 400 mg  See Hyperspace for full Linked Orders Report.   400 mg Oral  Once 05/24/21 0834 05/24/21 0913   05/03/2021 2145  cefTRIAXone (ROCEPHIN) 1 g in sodium chloride 0.9 % 100 mL IVPB        1 g 200 mL/hr over 30 Minutes Intravenous Every 24 hours 05/31/2021 2053 05/25/21 2212   05/27/2021 2145  azithromycin (ZITHROMAX) 500 mg in sodium chloride 0.9 % 250 mL IVPB        500 mg 250 mL/hr over 60 Minutes Intravenous Every 24 hours 05/22/2021 2053 05/15/2021 2322   05/06/2021 1545  ceFEPIme (MAXIPIME) 2 g in sodium chloride 0.9 % 100 mL IVPB        2 g 200 mL/hr over 30 Minutes Intravenous  Once 05/07/2021 1539 05/06/2021 1640        Subjective: Patient was seen and examined at bedside.  Overnight events noted.   Patient is alert and oriented X 3,  lying comfortably in bed.  Objective: Vitals:   05/27/21 2336 05/28/21 0432 05/28/21 1055 05/28/21 1435  BP: 102/77 105/75 121/81 (!) 116/98  Pulse: (!) 107 97 (!) 101 (!) 102  Resp: 20 20 20 16   Temp: 98.4 F (36.9 C) 98 F (36.7 C) 98.5 F (36.9 C) 98.5 F (36.9 C)  TempSrc: Axillary Oral Oral Oral  SpO2: 98% 92% 96% 96%  Weight:        Intake/Output Summary (Last 24 hours) at 05/28/2021 1635 Last data filed at 05/28/2021 1057 Gross per 24 hour  Intake 240 ml  Output 375 ml  Net -135 ml   Filed Weights   06/01/2021 1948  Weight: 61.3 kg    Examination:  General exam: Appears comfortable, chronically ill looking.  Not in any distress. Respiratory system: Clear to auscultation. Respiratory effort normal. Cardiovascular system: S1-S2 heard, regular rate and rhythm, no murmur. Gastrointestinal system: Abdomen is soft, nontender, nondistended, BS + Central nervous system: Alert and oriented. No focal neurological deficits. Extremities: Symmetric 5 x 5 power. Skin: No rashes, lesions or ulcers Psychiatry: Judgement and insight appear normal. Mood & affect appropriate.     Data Reviewed: I have  personally reviewed following labs and imaging studies  CBC: Recent Labs  Lab 05/24/21 0208 05/25/21 0128 05/26/21 0343 05/27/21 0135 05/28/21 0146  WBC 28.5* 36.8* 30.6* 21.9* 22.2*  HGB 7.7* 8.4* 7.4* 8.9* 8.6*  HCT 23.9* 26.8* 23.2* 27.4* 26.7*  MCV 79.7* 79.5* 77.3* 78.5* 79.2*  PLT 644* 775* 740* 545* 852*   Basic Metabolic Panel: Recent Labs  Lab 05/22/21 0207 05/23/21 0053 05/24/21 0208 05/28/21 0146  NA 137 138 135 138  K 3.4* 3.8 3.9 4.8  CL 109 109 106 106  CO2 20* 21* 21* 22  GLUCOSE 95 110* 95 91  BUN 12 10 9  33*  CREATININE 0.97 1.15 1.00 1.58*  CALCIUM 8.4* 8.6* 8.5* 8.6*  MG  --  1.5*  --  2.2  PHOS  --   --   --  3.6   GFR: Estimated Creatinine Clearance: 48.5 mL/min (A) (by C-G formula based on SCr of 1.58 mg/dL (H)). Liver Function Tests: Recent Labs  Lab 05/28/21 0146  AST 278*  ALT 476*  ALKPHOS 389*  BILITOT 1.1  PROT 6.5  ALBUMIN 2.5*   No results for input(s): LIPASE, AMYLASE in the last 168 hours. No results for input(s): AMMONIA in the last 168 hours. Coagulation Profile: No results for input(s): INR, PROTIME in the last 168 hours. Cardiac Enzymes: No results for  input(s): CKTOTAL, CKMB, CKMBINDEX, TROPONINI in the last 168 hours. BNP (last 3 results) No results for input(s): PROBNP in the last 8760 hours. HbA1C: No results for input(s): HGBA1C in the last 72 hours. CBG: No results for input(s): GLUCAP in the last 168 hours. Lipid Profile: No results for input(s): CHOL, HDL, LDLCALC, TRIG, CHOLHDL, LDLDIRECT in the last 72 hours. Thyroid Function Tests: No results for input(s): TSH, T4TOTAL, FREET4, T3FREE, THYROIDAB in the last 72 hours. Anemia Panel: No results for input(s): VITAMINB12, FOLATE, FERRITIN, TIBC, IRON, RETICCTPCT in the last 72 hours. Sepsis Labs: Recent Labs  Lab 05/23/21 0053 05/24/21 0208 05/25/21 0128  PROCALCITON <0.10 <0.10 0.23    No results found for this or any previous visit (from the past 240  hour(s)).   Radiology Studies: No results found.   Scheduled Meds:  (feeding supplement) PROSource Plus  30 mL Oral BID BM   colchicine  0.3 mg Oral Daily   dronabinol  2.5 mg Oral BID AC   feeding supplement  237 mL Oral QID   fluconazole  200 mg Oral Daily   folic acid  1 mg Oral Daily   hydrocortisone  25 mg Rectal BID   hydrocortisone-pramoxine  1 applicator Rectal BID   LORazepam  1 mg Intravenous Once   mometasone-formoterol  2 puff Inhalation BID   And   umeclidinium bromide  1 puff Inhalation Daily   multivitamin with minerals  1 tablet Oral Daily   pantoprazole sodium  40 mg Oral BID   potassium chloride  20 mEq Oral Daily   psyllium  1 packet Oral Daily   thiamine  100 mg Oral Daily   Or   thiamine  100 mg Intravenous Daily   traMADol  50 mg Oral Q6H   Continuous Infusions:  sodium chloride       LOS: 12 days    Time spent: 35 mins    Christopher Noxon, MD [facility]   If 7PM-7AM, please contact night-coverage

## 2021-05-28 NOTE — Evaluation (Signed)
Clinical/Bedside Swallow Evaluation Patient Details  Name: Christopher Henry MRN: [id number] Date of Birth: 1970/11/16  Today's Date: 05/28/2021 Time: SLP Start Time (ACUTE ONLY): 9628 SLP Stop Time (ACUTE ONLY): 1058 SLP Time Calculation (min) (ACUTE ONLY): 21 min  Past Medical History:  Past Medical History:  Diagnosis Date   Anxiety state, unspecified 11/09/2012   Hypertension    Past Surgical History:  Past Surgical History:  Procedure Laterality Date   BIOPSY  05/21/2021   Procedure: BIOPSY;  Surgeon: Jackquline Denmark, MD;  Location: [facility] ENDOSCOPY;  Service: Gastroenterology;;   BRONCHIAL BRUSHINGS  05/08/2021   Procedure: BRONCHIAL BRUSHINGS;  Surgeon: Rigoberto Noel, MD;  Location: [facility] ENDOSCOPY;  Service: Cardiopulmonary;;   BRONCHIAL WASHINGS  05/18/2021   Procedure: BRONCHIAL WASHINGS;  Surgeon: Rigoberto Noel, MD;  Location: [place];  Service: Cardiopulmonary;;   COLONOSCOPY WITH PROPOFOL N/A 05/31/2021   Procedure: COLONOSCOPY WITH PROPOFOL;  Surgeon: Jackquline Denmark, MD;  Location: [place];  Service: Gastroenterology;  Laterality: N/A;   CRYOTHERAPY  05/28/2021   Procedure: CRYOTHERAPY;  Surgeon: Rigoberto Noel, MD;  Location: [facility] ENDOSCOPY;  Service: Cardiopulmonary;;   ESOPHAGOGASTRODUODENOSCOPY (EGD) WITH PROPOFOL N/A 05/15/2021   Procedure: ESOPHAGOGASTRODUODENOSCOPY (EGD) WITH PROPOFOL;  Surgeon: Jackquline Denmark, MD;  Location: [place];  Service: Gastroenterology;  Laterality: N/A;   FINGER SURGERY     HEMOSTASIS CONTROL  05/29/2021   Procedure: HEMOSTASIS CONTROL;  Surgeon: Rigoberto Noel, MD;  Location: [facility] ENDOSCOPY;  Service: Cardiopulmonary;;   HIP SURGERY     POLYPECTOMY  05/24/2021   Procedure: POLYPECTOMY;  Surgeon: Jackquline Denmark, MD;  Location: [facility] ENDOSCOPY;  Service: Gastroenterology;;   VIDEO BRONCHOSCOPY Bilateral 05/11/2021   Procedure: VIDEO BRONCHOSCOPY WITH  BIOPSY;  Surgeon: Rigoberto Noel, MD;  Location: [place];  Service: Cardiopulmonary;   Laterality: Bilateral;   XI ROBOTIC ASSISTED PERICARDIAL WINDOW Left 05/24/2021   Procedure: PERICARDIAL WINDOW;  Surgeon: Lajuana Matte, MD;  Location: [facility] OR;  Service: Thoracic;  Laterality: Left;   HPI:  [name], 50 y.o. male with PMH of COPD, HTN, alcohol abuse, tobacco use 1 pack/day x 35 years sent from pulmonary office to ED for acute respiratory distress hypoxic 80% on room air, nonproductive cough and hematemesis over the past 2 months, painless rectal bleeding, chronic internal hemorrhoids with prolapse, burning sensation on the chest and greater than 50 pound weight loss x12 months. Identified to have metastatic non-small cell lung cancer. Oncology is involved he receives radiation with Dr. Lisbeth Renshaw. Has reported difficulty swallowing pills, food sticking in throat.    Assessment / Plan / Recommendation  Clinical Impression  Pt confirms feelings of globus with liquids, PO meds crushed in puree, very little solid intake. Pt demonstrates taking meds crushed, says it burns his mouth, immediate coughing during oral holding. When pt takes sips there is no coughing. Likely pt is suffering from esophageal inflammation following radiation to chest. SLP reviewed esophageal precautions verbally and in writing. Pt not very attentive, seems very overwhelmed by information in general. Problem may worsen with ongoing radiation. Set up suction given potential for expectoration of mucous and PO. Encouraged liquid intake with dense nutrition, avoiding acidic liquids. Drinking plenty of water. Using warm water to transit pills and solids as needed. Will sign off at this time. SLP Visit Diagnosis: Dysphagia, unspecified (R13.10)    Aspiration Risk  Mild aspiration risk    Diet Recommendation Thin liquid;Regular   Liquid Administration via: Cup;Straw Medication Administration: Crushed with puree Compensations: Slow rate;Small sips/bites;Follow  solids with liquid Postural Changes: Seated upright  at 90 degrees    Other  Recommendations      Recommendations for follow up therapy are one component of a multi-disciplinary discharge planning process, led by the attending physician.  Recommendations may be updated based on patient status, additional functional criteria and insurance authorization.  Follow up Recommendations        Frequency and Duration            Prognosis        Swallow Study   General HPI: [name], 50 y.o. male with PMH of COPD, HTN, alcohol abuse, tobacco use 1 pack/day x 35 years sent from pulmonary office to ED for acute respiratory distress hypoxic 80% on room air, nonproductive cough and hematemesis over the past 2 months, painless rectal bleeding, chronic internal hemorrhoids with prolapse, burning sensation on the chest and greater than 50 pound weight loss x12 months. Identified to have metastatic non-small cell lung cancer. Oncology is involved he receives radiation with Dr. Lisbeth Renshaw. Has reported difficulty swallowing pills, food sticking in throat. Type of Study: Bedside Swallow Evaluation Diet Prior to this Study: Regular;Thin liquids Temperature Spikes Noted: No Respiratory Status: Room air History of Recent Intubation: No Behavior/Cognition: Alert;Cooperative;Pleasant mood Oral Cavity Assessment: Within Functional Limits Oral Care Completed by SLP: No Oral Cavity - Dentition: Adequate natural dentition Vision: Functional for self-feeding Self-Feeding Abilities: Able to feed self Patient Positioning: Upright in bed Baseline Vocal Quality: Normal Volitional Cough: Strong Volitional Swallow: Able to elicit    Oral/Motor/Sensory Function Overall Oral Motor/Sensory Function: Within functional limits   Ice Chips Ice chips: Not tested   Thin Liquid Thin Liquid: Within functional limits Presentation: Cup;Straw    Nectar Thick Nectar Thick Liquid: Not tested   Honey Thick Honey Thick Liquid: Not tested   Puree Puree: Within functional  limits Presentation: Spoon   Solid     Solid: Not tested     Herbie Baltimore, MA [place] Office [phone]  Othelia Pulling, Katherene Ponto 05/28/2021,12:21 PM

## 2021-05-28 NOTE — Progress Notes (Signed)
[facility] on unit for transport pt to [facility] for radiation treatment.

## 2021-05-29 ENCOUNTER — Encounter: Payer: Self-pay | Admitting: *Deleted

## 2021-05-29 ENCOUNTER — Inpatient Hospital Stay (HOSPITAL_COMMUNITY): Payer: 59

## 2021-05-29 ENCOUNTER — Ambulatory Visit
Admit: 2021-05-29 | Discharge: 2021-05-29 | Disposition: A | Discharge status: Home / Self Care | Payer: 59 | Attending: Radiation Oncology | Admitting: Radiation Oncology

## 2021-05-29 DIAGNOSIS — C3490 Malignant neoplasm of unspecified part of unspecified bronchus or lung: Secondary | ICD-10-CM | POA: Diagnosis not present

## 2021-05-29 LAB — BASIC METABOLIC PANEL
Anion gap: 8 (ref 5–15)
BUN: 23 mg/dL — ABNORMAL HIGH (ref 6–20)
CO2: 24 mmol/L (ref 22–32)
Calcium: 8.7 mg/dL — ABNORMAL LOW (ref 8.9–10.3)
Chloride: 106 mmol/L (ref 98–111)
Creatinine, Ser: 1.26 mg/dL — ABNORMAL HIGH (ref 0.61–1.24)
GFR, Estimated: >60 mL/min (ref >60)
Glucose, Bld: 93 mg/dL (ref 70–99)
Potassium: 4.1 mmol/L (ref 3.5–5.1)
Sodium: 138 mmol/L (ref 135–145)

## 2021-05-29 LAB — CBC
HCT: 27 % — ABNORMAL LOW (ref 39.0–52.0)
Hemoglobin: 8.6 g/dL — ABNORMAL LOW (ref 13.0–17.0)
MCH: 25.5 pg — ABNORMAL LOW (ref 26.0–34.0)
MCHC: 31.9 g/dL (ref 30.0–36.0)
MCV: 80.1 fL (ref 80.0–100.0)
Platelets: 490 10*3/uL — ABNORMAL HIGH (ref 150–400)
RBC: 3.37 MIL/uL — ABNORMAL LOW (ref 4.22–5.81)
RDW: 19.3 % — ABNORMAL HIGH (ref 11.5–15.5)
WBC: 18.5 10*3/uL — ABNORMAL HIGH (ref 4.0–10.5)
nRBC: 0 % (ref 0.0–0.2)

## 2021-05-29 LAB — MAGNESIUM: Magnesium: 2.3 mg/dL (ref 1.7–2.4)

## 2021-05-29 LAB — PHOSPHORUS: Phosphorus: 4.2 mg/dL (ref 2.5–4.6)

## 2021-05-29 MED ORDER — GADOBUTROL 1 MMOL/ML IV SOLN
6.0000 mL | Freq: Once | INTRAVENOUS | Status: AC | PRN
  Administered 2021-05-29: 6 mL via INTRAVENOUS

## 2021-05-29 NOTE — Progress Notes (Signed)
Patient ID: Christopher Henry, male   DOB: 1971-03-14, 50 y.o.   MRN: [id number] [facility] Surgery Progress Note  9 Days Post-Op  Subjective: CC-  Could not sit still for MRI, willing to try again.  Objective: Vital signs in last 24 hours: Temp:  [98.4 F (36.9 C)-98.9 F (37.2 C)] 98.4 F (36.9 C) (10/27 0755) Pulse Rate:  [90-102] 92 (10/27 0755) Resp:  [16-21] 18 (10/27 0755) BP: (100-133)/(74-98) 133/89 (10/27 0755) SpO2:  [91 %-96 %] 95 % (10/27 0755) Weight:  [61.3 kg] 61.3 kg (10/27 0243) Last BM Date: 05/27/21  Intake/Output from previous day: 10/26 0701 - 10/27 0700 In: 480 [P.O.:480] Out: 375 [Urine:375] Intake/Output this shift: No intake/output data recorded.  PE: Gen:  Alert, NAD, pleasant Pulm:  rate and effort normal Abd: Soft, NT/ND Skin: warm and dry   Lab Results:  Recent Labs    05/28/21 0146 05/29/21 0146  WBC 22.2* 18.5*  HGB 8.6* 8.6*  HCT 26.7* 27.0*  PLT 502* 490*   BMET Recent Labs    05/28/21 0146 05/29/21 0146  NA 138 138  K 4.8 4.1  CL 106 106  CO2 22 24  GLUCOSE 91 93  BUN 33* 23*  CREATININE 1.58* 1.26*  CALCIUM 8.6* 8.7*   PT/INR No results for input(s): LABPROT, INR in the last 72 hours. CMP     Component Value Date/Time   NA 138 05/29/2021 0146   K 4.1 05/29/2021 0146   CL 106 05/29/2021 0146   CO2 24 05/29/2021 0146   GLUCOSE 93 05/29/2021 0146   BUN 23 (H) 05/29/2021 0146   CREATININE 1.26 (H) 05/29/2021 0146   CALCIUM 8.7 (L) 05/29/2021 0146   PROT 6.5 05/28/2021 0146   ALBUMIN 2.5 (L) 05/28/2021 0146   AST 278 (H) 05/28/2021 0146   ALT 476 (H) 05/28/2021 0146   ALKPHOS 389 (H) 05/28/2021 0146   BILITOT 1.1 05/28/2021 0146   GFRNONAA >60 05/29/2021 0146   GFRAA >60 10/24/2019 0954   Lipase     Component Value Date/Time   LIPASE 29 05/09/2021 1604       Studies/Results: No results found.  Anti-infectives: Anti-infectives (From admission, onward)    Start     Dose/Rate Route Frequency  Ordered Stop   05/25/21 1000  fluconazole (DIFLUCAN) tablet 200 mg       See Hyperspace for full Linked Orders Report.   200 mg Oral Daily 05/24/21 0834 06/07/21 0959   05/24/21 1000  fluconazole (DIFLUCAN) tablet 400 mg       See Hyperspace for full Linked Orders Report.   400 mg Oral  Once 05/24/21 0834 05/24/21 0913   05/18/2021 2145  cefTRIAXone (ROCEPHIN) 1 g in sodium chloride 0.9 % 100 mL IVPB        1 g 200 mL/hr over 30 Minutes Intravenous Every 24 hours 05/23/2021 2053 05/25/21 2212   05/23/2021 2145  azithromycin (ZITHROMAX) 500 mg in sodium chloride 0.9 % 250 mL IVPB        500 mg 250 mL/hr over 60 Minutes Intravenous Every 24 hours 05/22/2021 2053 05/04/2021 2322   05/22/2021 1545  ceFEPIme (MAXIPIME) 2 g in sodium chloride 0.9 % 100 mL IVPB        2 g 200 mL/hr over 30 Minutes Intravenous  Once 05/09/2021 1539 06/01/2021 1640        Assessment/Plan Prolapsed grade IV internal hemorrhoids - s/p colonoscopy on 05/15/2021 - CT abdomen/pelvis 05/25/21 for lung cancer staging reports Large mass  within the low pelvis encasing the distal rectum and herniating through the anus, highly concerning for malignancy. - Called MRI, will try again today.   FEN: regular ID: [email or web address] VTE: none   Stage IV non-small cell lung cancer with metastasis to pericardium undergoing radiation Malignant large pericardial effusion S/p open pericardial window by [name] 10/17 Sepsis/post obstructive PNA AKI COPD Severe malnutrition Candida esophagitis ABL anemia   LOS: 13 days    [name], [address] [facility] Surgery 05/29/2021, 11:02 AM Please see Amion for pager number during day hours 7:00am-4:30pm

## 2021-05-29 NOTE — Progress Notes (Signed)
This chaplain responded to PMT consult for spiritual care and creating/updating the Pt. Advance Directive. The Pt. has two visitors in the room at the time of the visit.   The chaplain introduced herself and confirmed the Pt. interest in completing an Advance Directive. The Pt. chose to leave the education on the bedside table and have the chaplain return at another time.  Chaplain Sallyanne Kuster [phone]

## 2021-05-29 NOTE — Progress Notes (Signed)
PROGRESS NOTE    Christopher Henry  YSA:[id number] DOB: 1971-02-03 DOA: 05/18/2021 PCP: Bary Castilla, NP   Brief Narrative:  This 50 y.o. male with PMH of COPD, HTN, alcohol abuse, tobacco use 1 pack/day x 35 years sent from pulmonary office to ED for acute respiratory distress hypoxic 80% on room air, nonproductive cough and hematemesis over the past 2 months, painless rectal bleeding, chronic internal hemorrhoids with prolapse, burning sensation on the chest and greater than 50 pound weight loss x12 months.  Upon presentation to the ED, hemoglobin 6.9 K, WBC 30 K, D-dimer 3.5, initial troponin 49 lactic acidosis 2.4 creatinine 1.5 chest x-ray RLL pneumonia. CTA chest no PE, infiltrative mass of the mediastinum, complete collapse of the right lower lobe with associated complete right lower lobe bronchi occlusion due to endobronchial debris/lesion,?  Mass extension into the carina, left to right midline shift due to decreased lung volume-concern for malignancy likely of the mediastinum with extension into or around the right mainstem bronchi leading to collapse of the right lower lobe, mediastinal lymphadenopathy, 1.7 cm groundglass opacity right apex, small to moderate volume pericardial effusion, subacute to chronic fracture of the medial sternal body.   05/26/2021 underwent bronchoscopy which showed large subcarinal mass indenting the carina and bulging bilaterally with large mainstem lesion obscuring 50% of lumen, biopsies taken. Large pericardial effusion noted on 2D echo, seen by cardiology and cardiothoracic surgery, S/p left anterior thoracotomy with drainage of pericardial effusion -open pericardial window by [name] 05/03/2021.  05/30/2021-underwent EGD/colonoscopy found to have prolapsed grade 4 rectal hemorrhoids.  Also started radiation therapy.  Post CT abdomen and pelvis with contrast for staging.  It revealed a large mass within the lower pelvis identified encasing the distal rectum and  herniating through the anus highly concerning for malignancy. 05/26/21-Had radiation therapy then was transfused 2 units PRBCs for hemoglobin of 7.4 K.   Hospital course complicated by intermittent rectal bleeding in the setting of internal hemorrhoids with prolapse and possible rectal mass.  Seen by general surgery, no planned for surgical intervention at this time.  MRI pelvis with and without contrast ordered to further assess possible rectal mass seen on CT scan.   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 Patient presented with large subcarinal/mediastinal mass extending into the carina, Mediastinal lymphadenopathy with right upper lobe nodule, Right lower lobe atelectasis with endobronchial lesion: S/p bronchoscopy 10/15- mass potential to cause central airway obstruction. biopsy showed atypical cells. Rad Onc has seen and radiotherapy started  on 10/19 urgently.  Second radiation session on 05/26/2021.  Medical oncology input appreciated started initial staging with MRI of the brain with and without-that was normal, Biopsy of pericardium showed metastatic carcinoma. Palliative care consulted to discuss goals of care.  Patient wants to treat the treatable. Patient wants to be full code.  Malignant large pericardial effusion.: S/p open pericardial window by [name] 05/10/2021.   Drain removed on 05/22/21 and chest x-ray no pneumothorax.   Pericardial biopsy showed metastatic carcinoma.  Continue colchicine. Pain is  currently well controlled on current pain management.   Pelvic mass: Large mass within the lower pelvis identified encasing the distal rectum and herniated through the anus with concern for malignancy. MRI pelvis with and without contrast ordered on 05/27/2021, follow results. Medical oncology and general surgery following.   Chronic blood loss anemia in the setting of chronic internal hemorrhoids. Transfused 2 units PRBCs on 05/26/2021 for hemoglobin of 7.4 K. Continue to monitor H&H.  Hemoglobin today morning 8.6.   Chest discomfort: S/p pericardial window for malignant pericardial effusion Pain is improved with leaning forward. Continue colchicine. Continue as needed narcotic analgesics.   Leukocytosis suspect multifactorial.   Leukocytosis is downtrending, 18.5 from 30.6. Completed Rocephin for CAP X10 days, last day 05/26/2021. Remains afebrile. Continue to monitor.   Acute hypoxic respiratory failure > resolved. He was hypoxic 80% on room air with respiratory distress on admission 2/2 pneumonia/mediastinal mass.  Currently on room air with O2 saturation 96%.   Sepsis secondary to pneumonia /postobstructive pneumonia: In the setting of malignancy.  Completed antibiotics course.  Hematochezia/melena Acute blood loss anemia in the setting of painless rectal bleeding due to chronic large internal hemorrhoids with prolapse.   Anemia of chronic disease  S/P multiple PRBC transfusions.  Hb. Stable now. Transfuse if less than 7 g.   S/p EGD colonoscopy 10/18-mild esophagitis-positive for Candida esophagitis- large grade 4 prolapsed hemorrhoids-general surgery consulted and advised medical and topical care no plan for surgery at this time- w/ H prep, sitz bath (patient refusing bath).  Transfuse to keep hemoglobin above 8 g may need additional transfusion prior to discharge.   Candida esophagitis: Continue fluconazole   Dysphagia Having trouble swallowing his p.o.  medications. Feeling of food getting stuck in his throat. Speech therapist consulted for swallow evaluation. Aspiration precautions in place.   Hypomagnesemia: Replaced and resolved.   AKI : Resolved, again creatinine up to 1.26 Continue iv gentle hydration   COPD: Currently not in exacerbation. Continue Dulera,Incruse Ellipta and bronchodilators as needed   Severe protein calorie malnutrition with significant weight loss and BMI 16.45.   Severe muscle mass loss. Albumin 3.3 on 05/06/2021. In the setting of malignancy overall poor prognostic indicator. Appreciate dietitian's assistance. Continue Marinol Continue to encourage increase oral protein calorie intake.   History of alcohol use/tobacco use : Continue thiamine folic acid multivitamins.   Deconditioning:  Evaluated by PT recommendation for home health PT. Continue PT OT with assistance      Nutrition Problem: Severe Malnutrition Etiology: acute illness (newly diagnosed lung cancer) Signs/Symptoms: severe muscle depletion, severe fat depletion, percent weight loss (6% weight loss in one month) Percent weight loss: 6 % Interventions: MVI, Magic cup, Ensure Enlive (each supplement provides 350kcal and 20 grams of protein)   Thrombocytosis, improving, suspect in the setting of acute illness. Platelet count downtrending 545 K from 740 K. Continue to monitor.  DVT prophylaxis: SCDs Code Status: Full code. Family Communication: No family at bed side. Disposition Plan:   Status is: Inpatient  Remains inpatient appropriate because: Needs work-up for malignancy.   Anticipated discharge in 3 days.  Consultants:  Palliative care Oncology General surgery  Procedures:  Antimicrobials:   Anti-infectives (From admission, onward)    Start     Dose/Rate Route Frequency Ordered Stop   05/25/21 1000  fluconazole (DIFLUCAN) tablet 200 mg       See Hyperspace for full Linked Orders Report.   200 mg Oral Daily  05/24/21 0834 06/07/21 0959   05/24/21 1000  fluconazole (DIFLUCAN) tablet 400 mg       See Hyperspace for full Linked Orders Report.   400 mg Oral  Once 05/24/21 0834 05/24/21 0913   06/02/2021 2145  cefTRIAXone (ROCEPHIN) 1 g in sodium chloride 0.9 % 100 mL IVPB        1 g 200 mL/hr over 30 Minutes Intravenous Every 24 hours 05/06/2021 2053 05/25/21 2212   05/04/2021 2145  azithromycin (ZITHROMAX) 500 mg in sodium chloride 0.9 % 250 mL IVPB        500 mg 250 mL/hr over 60 Minutes Intravenous Every 24 hours 05/31/2021 2053 05/14/2021 2322   05/13/2021 1545  ceFEPIme (MAXIPIME) 2 g in sodium chloride 0.9 % 100 mL IVPB        2 g 200 mL/hr over 30 Minutes Intravenous  Once 06/02/2021 1539 05/12/2021 1640        Subjective: Patient was seen and examined at bedside.  Overnight events noted.   Patient is alert and oriented x 3, He is lying comfortably in the bed.  He reports having cough.  Objective: Vitals:   05/29/21 0351 05/29/21 0755 05/29/21 1415 05/29/21 1434  BP: 115/77 133/89 127/79   Pulse: 90 92 (!) 104 (!) 103  Resp: 19 18 20 20   Temp: 98.9 F (37.2 C) 98.4 F (36.9 C)    TempSrc: Oral Oral    SpO2: 96% 95% 94%   Weight:      Height:        Intake/Output Summary (Last 24 hours) at 05/29/2021 1450 Last data filed at 05/29/2021 0224 Gross per 24 hour  Intake 240 ml  Output --  Net 240 ml   Filed Weights   05/15/2021 1948 05/29/21 0243  Weight: 61.3 kg 61.3 kg    Examination:  General exam: Appears comfortable, chronically ill looking.  Not in any distress. Respiratory system: Clear to auscultation. Respiratory effort normal. Cardiovascular system: S1-S2 heard, regular rate and rhythm, no murmur. Gastrointestinal system: Abdomen is soft, nontender, nondistended, BS + Central nervous system: Alert and oriented. No focal neurological deficits. Extremities: No edema, no cyanosis, no clubbing. Skin: No rashes, lesions or ulcers Psychiatry: Judgement and insight appear normal.  Mood & affect appropriate.     Data Reviewed: I have personally reviewed following labs and imaging studies  CBC: Recent Labs  Lab 05/25/21 0128 05/26/21 0343 05/27/21 0135 05/28/21 0146 05/29/21 0146  WBC 36.8* 30.6* 21.9* 22.2* 18.5*  HGB 8.4* 7.4* 8.9* 8.6* 8.6*  HCT 26.8* 23.2* 27.4* 26.7* 27.0*  MCV 79.5* 77.3* 78.5* 79.2* 80.1  PLT 775* 740* 545* 502* 355*   Basic Metabolic Panel: Recent Labs  Lab 05/23/21 0053 05/24/21 0208 05/28/21 0146 05/29/21 0146  NA 138 135 138 138  K 3.8 3.9 4.8 4.1  CL 109 106 106 106  CO2 21* 21* 22 24  GLUCOSE 110* 95 91 93  BUN 10 9 33* 23*  CREATININE 1.15 1.00 1.58* 1.26*  CALCIUM 8.6* 8.5* 8.6* 8.7*  MG 1.5*  --  2.2 2.3  PHOS  --   --  3.6 4.2   GFR: Estimated Creatinine Clearance: 60.8 mL/min (A) (by C-G formula based on SCr of 1.26 mg/dL (H)). Liver Function Tests: Recent Labs  Lab 05/28/21 0146  AST 278*  ALT 476*  ALKPHOS 389*  BILITOT 1.1  PROT 6.5  ALBUMIN 2.5*   No results for input(s): LIPASE, AMYLASE in the last 168 hours. No results for input(s): AMMONIA in the last 168 hours. Coagulation Profile:  No results for input(s): INR, PROTIME in the last 168 hours. Cardiac Enzymes: No results for input(s): CKTOTAL, CKMB, CKMBINDEX, TROPONINI in the last 168 hours. BNP (last 3 results) No results for input(s): PROBNP in the last 8760 hours. HbA1C: No results for input(s): HGBA1C in the last 72 hours. CBG: No results for input(s): GLUCAP in the last 168 hours. Lipid Profile: No results for input(s): CHOL, HDL, LDLCALC, TRIG, CHOLHDL, LDLDIRECT in the last 72 hours. Thyroid Function Tests: No results for input(s): TSH, T4TOTAL, FREET4, T3FREE, THYROIDAB in the last 72 hours. Anemia Panel: No results for input(s): VITAMINB12, FOLATE, FERRITIN, TIBC, IRON, RETICCTPCT in the last 72 hours. Sepsis Labs: Recent Labs  Lab 05/23/21 0053 05/24/21 0208 05/25/21 0128  PROCALCITON <0.10 <0.10 0.23    No results  found for this or any previous visit (from the past 240 hour(s)).   Radiology Studies: No results found.   Scheduled Meds:  (feeding supplement) PROSource Plus  30 mL Oral BID BM   colchicine  0.3 mg Oral Daily   dronabinol  2.5 mg Oral BID AC   feeding supplement  237 mL Oral QID   fluconazole  200 mg Oral Daily   folic acid  1 mg Oral Daily   hydrocortisone  25 mg Rectal BID   hydrocortisone-pramoxine  1 applicator Rectal BID   LORazepam  1 mg Intravenous Once   mometasone-formoterol  2 puff Inhalation BID   And   umeclidinium bromide  1 puff Inhalation Daily   multivitamin with minerals  1 tablet Oral Daily   pantoprazole sodium  40 mg Oral BID   potassium chloride  20 mEq Oral Daily   psyllium  1 packet Oral Daily   thiamine  100 mg Oral Daily   Or   thiamine  100 mg Intravenous Daily   traMADol  50 mg Oral Q6H   Continuous Infusions:  sodium chloride       LOS: 13 days    Time spent: 25 mins    Shawna Clamp, MD [facility]   If 7PM-7AM, please contact night-coverage

## 2021-05-29 NOTE — Progress Notes (Signed)
I followed up on molecular and PDL 1 testing with [facility]. According to the patient portal, results are estimated to be completed on 11/8.

## 2021-05-29 NOTE — Progress Notes (Signed)
Physical Therapy Treatment Patient Details Name: Christopher Henry MRN: [id number] DOB: 1970/09/24 Today's Date: 05/29/2021   History of Present Illness The pt is a 50 yo male presenting 10/14 due to SOB with hypoxia. Pt also unintentional weight loss and hemoptysis x2.  Pt found to have pericardial effusion in the setting of locally advanced lung cancer as well as prolapsed grade IV hemorrhoids. Underwent pericardial window 10/17. s/p EGD on 10/18, initiation of radiation on 10/18. CT on 10/23 showed large pelvic mass.  PMH includes: tobacco use (x35 years) and HTN.    PT Comments    Pt was reluctant to participate, having had a full and tiring day, but with encouragement agreed.  Emphasis on progression of gait stability, quality and stamina with appropriate standing rest breaks to be able to attain 160 feet without sitting.    Recommendations for follow up therapy are one component of a multi-disciplinary discharge planning process, led by the attending physician.  Recommendations may be updated based on patient status, additional functional criteria and insurance authorization.  Follow Up Recommendations  Home health PT     Assistance Recommended at Discharge Intermittent Supervision/Assistance  Equipment Recommendations  Rolling walker (2 wheels);Wheelchair (measurements PT);Wheelchair cushion (measurements PT);3in1 (PT)    Recommendations for Other Services       Precautions / Restrictions Precautions Precautions: Fall     Mobility  Bed Mobility Overal bed mobility: Modified Independent                  Transfers Overall transfer level: Needs assistance Equipment used: Rolling walker (2 wheels) Transfers: Sit to/from Stand Sit to Stand: Min guard           General transfer comment: effortful descent    Ambulation/Gait Ambulation/Gait assistance: Min assist Gait Distance (Feet): 160 Feet Assistive device: Rolling walker (2 wheels) Gait Pattern/deviations:  Step-through pattern Gait velocity: decr Gait velocity interpretation: 1.31 - 2.62 ft/sec, indicative of limited community ambulator General Gait Details: pt unsteady overall with tendency to list L, using the RW to conteract this tendency.  Pt tends to drift R, running into objects.  Mildly uncoordinated steps overall.  Pt notably fatigued, noticeable SOB, difficulty exhalling, coughing.   Stairs             Wheelchair Mobility    Modified Rankin (Stroke Patients Only)       Balance Overall balance assessment: Needs assistance   Sitting balance-Leahy Scale: Good       Standing balance-Leahy Scale: Poor Standing balance comment: reliant on at least 1 extremity for support.                            Cognition Arousal/Alertness: Awake/alert Behavior During Therapy: Flat affect Overall Cognitive Status: Within Functional Limits for tasks assessed                                          Exercises      General Comments        Pertinent Vitals/Pain Pain Assessment: Faces Faces Pain Scale: Hurts even more Pain Location: abdomen and hemorrhoids Pain Descriptors / Indicators: Discomfort Pain Intervention(s): Monitored during session;Limited activity within patient's tolerance;Premedicated before session    Home Living  Prior Function            PT Goals (current goals can now be found in the care plan section) Acute Rehab PT Goals Patient Stated Goal: None stated PT Goal Formulation: With patient/family Time For Goal Achievement: 06/07/21 Potential to Achieve Goals: Fair Progress towards PT goals: Progressing toward goals    Frequency    Min 3X/week      PT Plan Current plan remains appropriate    Co-evaluation              AM-PAC PT "6 Clicks" Mobility   Outcome Measure  Help needed turning from your back to your side while in a flat bed without using bedrails?: None Help  needed moving from lying on your back to sitting on the side of a flat bed without using bedrails?: None Help needed moving to and from a bed to a chair (including a wheelchair)?: A Little Help needed standing up from a chair using your arms (e.g., wheelchair or bedside chair)?: A Little Help needed to walk in hospital room?: A Little Help needed climbing 3-5 steps with a railing? : A Lot 6 Click Score: 19    End of Session   Activity Tolerance: Patient limited by fatigue Patient left: in bed;with call bell/phone within reach Nurse Communication: Mobility status PT Visit Diagnosis: Other abnormalities of gait and mobility (R26.89);Muscle weakness (generalized) (M62.81);Unsteadiness on feet (R26.81)     Time: 6045-4098 PT Time Calculation (min) (ACUTE ONLY): 13 min  Charges:  $Gait Training: 8-22 mins                     05/29/2021  Ginger Carne., PT Acute Rehabilitation Services [phone]  (pager) [phone]  (office)   [name] [place] 05/29/2021, 6:05 PM

## 2021-05-29 NOTE — Plan of Care (Signed)
  Problem: Health Behavior/Discharge Planning: Goal: Ability to manage health-related needs will improve Outcome: Not Progressing   

## 2021-05-29 NOTE — Progress Notes (Signed)
MD notified patient has forms for work to be filled out at the bedside.  Daymon Larsen, RN

## 2021-05-30 ENCOUNTER — Telehealth: Payer: Self-pay | Admitting: Hematology

## 2021-05-30 ENCOUNTER — Ambulatory Visit
Admit: 2021-05-30 | Discharge: 2021-05-30 | Disposition: A | Discharge status: Home / Self Care | Payer: 59 | Attending: Radiation Oncology | Admitting: Radiation Oncology

## 2021-05-30 DIAGNOSIS — C3490 Malignant neoplasm of unspecified part of unspecified bronchus or lung: Secondary | ICD-10-CM | POA: Diagnosis not present

## 2021-05-30 NOTE — Progress Notes (Signed)
This chaplain attempted a F/U spiritual care visit to talk about the Pt. Advance Directive.    The chaplain understands the Pt. just left for radiation at [facility] with an expected return before 4pm.  This chaplain will F/U at the best time for the Pt. and spiritual care team.   Lillia Mountain [phone]

## 2021-05-30 NOTE — Telephone Encounter (Signed)
Scheduled per sch msg. Will receive printout upon discharge

## 2021-05-30 NOTE — Progress Notes (Signed)
PROGRESS NOTE    Christopher Henry  FKC:[id number] DOB: Dec 18, 1970 DOA: 05/25/2021 PCP: Bary Castilla, NP   Brief Narrative:  This 50 y.o. male with PMH of COPD, HTN, alcohol abuse, tobacco use 1 pack/day x 35 years sent from pulmonary office to ED for acute respiratory distress hypoxic 80% on room air, nonproductive cough and hematemesis over the past 2 months, painless rectal bleeding, chronic internal hemorrhoids with prolapse, burning sensation on the chest and greater than 50 pound weight loss x12 months.  Upon presentation to the ED, hemoglobin 6.9 K, WBC 30 K, D-dimer 3.5, initial troponin 49 lactic acidosis 2.4 creatinine 1.5 chest x-ray RLL pneumonia. CTA chest no PE, infiltrative mass of the mediastinum, complete collapse of the right lower lobe with associated complete right lower lobe bronchi occlusion due to endobronchial debris/lesion,?  Mass extension into the carina, left to right midline shift due to decreased lung volume-concern for malignancy likely of the mediastinum with extension into or around the right mainstem bronchi leading to collapse of the right lower lobe, mediastinal lymphadenopathy, 1.7 cm groundglass opacity right apex, small to moderate volume pericardial effusion, subacute to chronic fracture of the medial sternal body.   05/13/2021 underwent bronchoscopy which showed large subcarinal mass indenting the carina and bulging bilaterally with large mainstem lesion obscuring 50% of lumen, biopsies taken. Large pericardial effusion noted on 2D echo, seen by cardiology and cardiothoracic surgery, S/p left anterior thoracotomy with drainage of pericardial effusion -open pericardial window by [name] 05/23/2021.  05/29/2021-underwent EGD/colonoscopy found to have prolapsed grade 4 rectal hemorrhoids.  Also started radiation therapy.  Post CT abdomen and pelvis with contrast for staging.  It revealed a large mass within the lower pelvis identified encasing the distal rectum and  herniating through the anus highly concerning for malignancy.  05/26/21-Had radiation therapy then was transfused 2 units PRBCs for hemoglobin of 7.4 K.   Hospital course complicated by intermittent rectal bleeding in the setting of internal hemorrhoids with prolapse and possible rectal mass.  Seen by general surgery, no plans for surgical intervention at this time.  MRI pelvis completed. Consider endoscopic ultrasound for biopsy to further define the nature of his pelvic mass in order to guide further treatment or goals of care decisions.   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 Patient presented with large subcarinal/mediastinal mass extending into the carina, Mediastinal lymphadenopathy with right upper lobe nodule, Right lower lobe atelectasis with endobronchial lesion: S/p bronchoscopy 10/15- mass potential to cause central airway obstruction. biopsy showed atypical cells. Rad Onc has seen and radiotherapy started  on 10/19 urgently.  Second radiation session on 05/26/2021.  Medical oncology input appreciated started initial staging with MRI of the brain with and without-that was normal, Biopsy of pericardium showed metastatic carcinoma. Palliative care consulted to discuss goals of care.  Patient wants to treat the treatable. Patient wants to be full code.  Malignant large pericardial effusion.: S/p open pericardial window by [name] 05/23/2021.   Drain removed on 05/22/21 and chest x-ray no pneumothorax.  Pericardial biopsy showed metastatic carcinoma.  Continue colchicine. Pain is well controlled with current pain management.   Pelvic mass: Large mass within the lower pelvis identified encasing the distal rectum and herniated through the anus with concern for malignancy. MRI pelvis with and without contrast ordered on 05/27/2021. Medical oncology and general surgery following. Surgery recommended consider endoscopic ultrasound for biopsy to further define the nature of his pelvic mass in order to guide further treatment or goals of care decisions.   Chronic blood loss anemia in the setting of chronic internal hemorrhoids. Transfused 2 units PRBCs on 05/26/2021 for hemoglobin of 7.4 K. Continue to monitor H&H.  Hemoglobin today morning 8.6.   Chest discomfort: S/p pericardial window for malignant pericardial effusion Pain is improved with leaning forward. Continue colchicine. Continue as needed narcotic analgesics.   Leukocytosis suspect multifactorial.   Leukocytosis is downtrending, 18.5 from 30.6. Completed Rocephin for CAP X10 days, last day 05/26/2021. Remains afebrile. Continue to monitor.   Acute hypoxic respiratory failure > resolved. He was hypoxic 80% on room air with respiratory distress on admission 2/2 pneumonia/mediastinal mass.  Currently on room air with O2 saturation 96%.   Sepsis secondary to pneumonia /postobstructive pneumonia: In the setting of malignancy.  Completed antibiotics course.  Hematochezia/melena Acute blood loss anemia in the setting of painless rectal bleeding due to chronic large internal hemorrhoids with prolapse.   Anemia of chronic disease  S/P multiple PRBC transfusions.  Hb. Stable now. Transfuse if less than 7 g.   S/p EGD colonoscopy 10/18-mild esophagitis-positive for Candida esophagitis- large grade 4 prolapsed hemorrhoids-general surgery consulted and advised medical and topical care no plan for surgery at this time- w/ H prep, sitz  bath (patient refusing bath).  Transfuse to keep hemoglobin above 8 g may need additional transfusion prior to discharge.   Candida esophagitis: Continue fluconazole   Dysphagia Having trouble swallowing his p.o. medications. Feeling of food getting stuck in his throat. Speech therapist consulted for swallow evaluation. Aspiration precautions in place.   Hypomagnesemia: Replaced and resolved.   AKI : Resolved, again creatinine up to 1.26 Continue iv gentle hydration   COPD: Currently not in exacerbation. Continue Dulera,Incruse Ellipta and bronchodilators as needed   Severe protein calorie malnutrition with significant weight loss and BMI 16.45.   Severe muscle mass loss. Albumin 3.3 on 05/27/2021. In the setting of malignancy overall poor prognostic indicator. Appreciate dietitian's assistance. Continue Marinol Continue to encourage increase oral protein calorie intake.   History of alcohol use/tobacco use : Continue thiamine folic acid multivitamins.   Deconditioning:  Evaluated by PT recommendation for home health PT. Continue PT OT with assistance      Nutrition Problem: Severe Malnutrition Etiology: acute illness (newly diagnosed lung cancer) Signs/Symptoms: severe muscle depletion, severe fat depletion, percent weight loss (6% weight loss in one month) Percent weight loss: 6 % Interventions: MVI, Magic cup, Ensure Enlive (each supplement provides 350kcal and 20 grams of protein)   Thrombocytosis, improving, suspect in the setting of acute illness. Platelet count downtrending 545 K from 740 K. Continue to monitor.  DVT prophylaxis: SCDs Code Status: Full code. Family Communication: No family at bed side. Disposition Plan:   Status is: Inpatient  Remains inpatient appropriate because: Pending work-up for malignancy.   Anticipated discharge in 3 days.  Consultants:  Palliative care Oncology General surgery  Procedures:  Antimicrobials:    Anti-infectives (From admission, onward)    Start     Dose/Rate Route Frequency Ordered Stop   05/25/21  1000  fluconazole (DIFLUCAN) tablet 200 mg       See Hyperspace for full Linked Orders Report.   200 mg Oral Daily 05/24/21 0834 06/07/21 0959   05/24/21 1000  fluconazole (DIFLUCAN) tablet 400 mg       See Hyperspace for full Linked Orders Report.   400 mg Oral  Once 05/24/21 0834 05/24/21 0913   06/02/2021 2145  cefTRIAXone (ROCEPHIN) 1 g in sodium chloride 0.9 % 100 mL IVPB        1 g 200 mL/hr over 30 Minutes Intravenous Every 24 hours 05/31/2021 2053 05/25/21 2212   05/29/2021 2145  azithromycin (ZITHROMAX) 500 mg in sodium chloride 0.9 % 250 mL IVPB        500 mg 250 mL/hr over 60 Minutes Intravenous Every 24 hours 05/24/2021 2053 05/29/2021 2322   05/07/2021 1545  ceFEPIme (MAXIPIME) 2 g in sodium chloride 0.9 % 100 mL IVPB        2 g 200 mL/hr over 30 Minutes Intravenous  Once 05/12/2021 1539 05/04/2021 1640        Subjective: Patient was seen and examined at bedside.  Overnight events noted.   Patient is sitting comfortably on the bed,  denies any shortness of breath. He reports cough is getting better.  Pain is reasonably controlled.  Objective: Vitals:   05/29/21 1956 05/29/21 2332 05/30/21 0347 05/30/21 0725  BP: 126/70 113/74 115/80 117/79  Pulse: 96 94 96 90  Resp: 20 18 20  (!) 22  Temp: 97.8 F (36.6 C) 98.3 F (36.8 C) 98.2 F (36.8 C) 98.6 F (37 C)  TempSrc: Oral Oral Oral Oral  SpO2: 94% 95% 96% 94%  Weight:      Height:        Intake/Output Summary (Last 24 hours) at 05/30/2021 1453 Last data filed at 05/30/2021 0700 Gross per 24 hour  Intake 240 ml  Output --  Net 240 ml   Filed Weights   05/10/2021 1948 05/29/21 0243  Weight: 61.3 kg 61.3 kg    Examination:  General exam: Appears comfortable, chronically ill looking.  Not in any distress. Respiratory system: Clear to auscultation. Respiratory effort normal. Cardiovascular system: S1-S2 heard,  regular rate and rhythm, no murmur. Gastrointestinal system: Abdomen is soft, nontender, nondistended, BS + Central nervous system: Alert and oriented. No focal neurological deficits. Extremities: No edema, no cyanosis, no clubbing. Skin: No rashes, lesions or ulcers Psychiatry: Judgement and insight appear normal. Mood & affect appropriate.     Data Reviewed: I have personally reviewed following labs and imaging studies  CBC: Recent Labs  Lab 05/25/21 0128 05/26/21 0343 05/27/21 0135 05/28/21 0146 05/29/21 0146  WBC 36.8* 30.6* 21.9* 22.2* 18.5*  HGB 8.4* 7.4* 8.9* 8.6* 8.6*  HCT 26.8* 23.2* 27.4* 26.7* 27.0*  MCV 79.5* 77.3* 78.5* 79.2* 80.1  PLT 775* 740* 545* 502* 563*   Basic Metabolic Panel: Recent Labs  Lab 05/24/21 0208 05/28/21 0146 05/29/21 0146  NA 135 138 138  K 3.9 4.8 4.1  CL 106 106 106  CO2 21* 22 24  GLUCOSE 95 91 93  BUN 9 33* 23*  CREATININE 1.00 1.58* 1.26*  CALCIUM 8.5* 8.6* 8.7*  MG  --  2.2 2.3  PHOS  --  3.6 4.2   GFR: Estimated Creatinine Clearance: 60.8 mL/min (A) (by C-G formula based on SCr of 1.26 mg/dL (H)). Liver Function Tests: Recent Labs  Lab 05/28/21 0146  AST 278*  ALT 476*  ALKPHOS 389*  BILITOT 1.1  PROT 6.5  ALBUMIN 2.5*   No results for input(s): LIPASE, AMYLASE in the last 168 hours. No results for input(s): AMMONIA in the last 168 hours. Coagulation Profile: No results for input(s): INR, PROTIME in the last 168 hours. Cardiac Enzymes: No results for input(s): CKTOTAL, CKMB, CKMBINDEX, TROPONINI in the last 168 hours. BNP (last 3 results) No results for input(s): PROBNP in the last 8760 hours. HbA1C: No results for input(s): HGBA1C in the last 72 hours. CBG: No results for input(s): GLUCAP in the last 168 hours. Lipid Profile: No results for input(s): CHOL, HDL, LDLCALC, TRIG, CHOLHDL, LDLDIRECT in the last 72 hours. Thyroid Function Tests: No results for input(s): TSH, T4TOTAL, FREET4, T3FREE, THYROIDAB in  the last 72 hours. Anemia Panel: No results for input(s): VITAMINB12, FOLATE, FERRITIN, TIBC, IRON, RETICCTPCT in the last 72 hours. Sepsis Labs: Recent Labs  Lab 05/24/21 0208 05/25/21 0128  PROCALCITON <0.10 0.23    No results found for this or any previous visit (from the past 240 hour(s)).   Radiology Studies: MR PELVIS W WO CONTRAST  Result Date: 05/30/2021 CLINICAL DATA:  A 50 year old male presents for evaluation of anal/perianal mass. EXAM: MRI PELVIS WITHOUT AND WITH CONTRAST TECHNIQUE: Multiplanar multisequence MR imaging of the pelvis was performed both before and after administration of intravenous contrast. CONTRAST:  17mL GADAVIST GADOBUTROL 1 MMOL/ML IV SOLN COMPARISON:  Comparison made with CT of the abdomen and pelvis from May 25, 2021. FINDINGS: Urinary Tract: Limited assessment of the urinary tract shows no gross thickening of the urinary bladder. No distal ureteral dilation. Bowel: There is an ulcerated, fungating lesion that extends from the inferior aspect of the sphincter complex into the perianal region. This measures 4.1 x 3.6 cm (image 43/6) Separate from this area is a horseshoe shaped abnormality that involves the sacrococcygeal region displacing the levator plate and insinuating between slips of the pelvic floor musculature. Heterogeneous T2 signal is noted Signs of enhancement are suspected though subtraction images and true baseline images not performed. Definitive enhancement of the anal mass is noted. Peri rectal and pelvic floor/sacrococcygeal process measuring 6.8 x 6.5 cm in the axial plane anterior posterior extension is greatest on the RIGHT. Approximately 4.6 cm anterior-posterior extension on the LEFT. Areas of low T2 intermixed with intermediate to high T2 signal within the lesion. This appears to invade the distal rectum on sagittal image 38 where approximately 2.5 x 1.0 cm nodule along the posterior wall of the rectum is visible. The presence of  extensive rectal gas limits assessment of luminal and mass relationships Vascular/Lymphatic: Vascular structures are not well assessed given protocol performed. Top-normal size LEFT common iliac lymph node at 9 mm (image 3/6) susceptibility artifact from a RIGHT hip arthroplasty limits assessment of pelvic structures. Reproductive: Prostate without gross abnormality. Limited assessment of reproductive structures is unremarkable. Other:  Trace fluid in the pelvis. Musculoskeletal: RIGHT hip arthroplasty changes. The no focal suspicious lesion or destructive process. 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 Horseshoe shaped complex lesion in the perirectal space involving the pelvic floor and likely invading the rectum. 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 Given location, unusual for metastases and not contiguous with the perianal process would suggest endoscopic ultrasound with biopsy as warranted for further assessment/characterization. Top-normal size LEFT common iliac lymph node at 9 mm. Given findings on prior exams and the current study PET evaluation may be helpful for  further assessment to complete staging as well. Electronically Signed   By: Zetta Bills M.D.   On: 05/30/2021 09:47     Scheduled Meds:  (feeding supplement) PROSource Plus  30 mL Oral BID BM   colchicine  0.3 mg Oral Daily   dronabinol  2.5 mg Oral BID AC   feeding supplement  237 mL Oral QID   fluconazole  200 mg Oral Daily   folic acid  1 mg Oral Daily   hydrocortisone  25 mg Rectal BID   hydrocortisone-pramoxine  1 applicator Rectal BID   LORazepam  1 mg Intravenous Once   mometasone-formoterol  2 puff Inhalation BID   And   umeclidinium bromide  1 puff Inhalation Daily   multivitamin with minerals  1 tablet Oral Daily   pantoprazole sodium  40 mg Oral BID   potassium  chloride  20 mEq Oral Daily   psyllium  1 packet Oral Daily   thiamine  100 mg Oral Daily   Or   thiamine  100 mg Intravenous Daily   traMADol  50 mg Oral Q6H   Continuous Infusions:  sodium chloride       LOS: 14 days    Time spent: 25 mins    Shawna Clamp, MD [facility]   If 7PM-7AM, please contact night-coverage

## 2021-05-30 NOTE — Progress Notes (Signed)
Progress Note  10 Days Post-Op  Subjective: Pt reports some mild lower abdominal pain that is worse with coughing this AM. He is having blood per rectum. Denies nausea.   Objective: Vital signs in last 24 hours: Temp:  [97.5 F (36.4 C)-98.6 F (37 C)] 98.6 F (37 C) (10/28 0725) Pulse Rate:  [90-104] 90 (10/28 0725) Resp:  [18-22] 22 (10/28 0725) BP: (113-127)/(70-80) 117/79 (10/28 0725) SpO2:  [94 %-96 %] 94 % (10/28 0725) Last BM Date: 05/29/21  Intake/Output from previous day: 10/27 0701 - 10/28 0700 In: 240 [P.O.:240] Out: -  Intake/Output this shift: No intake/output data recorded.  PE: Gen:  Alert, NAD, pleasant Pulm:  rate and effort normal Abd: Soft, ND, +BS, mild ttp in RLQ without peritonitis  Skin: warm and dry    Lab Results:  Recent Labs    05/28/21 0146 05/29/21 0146  WBC 22.2* 18.5*  HGB 8.6* 8.6*  HCT 26.7* 27.0*  PLT 502* 490*   BMET Recent Labs    05/28/21 0146 05/29/21 0146  NA 138 138  K 4.8 4.1  CL 106 106  CO2 22 24  GLUCOSE 91 93  BUN 33* 23*  CREATININE 1.58* 1.26*  CALCIUM 8.6* 8.7*   PT/INR No results for input(s): LABPROT, INR in the last 72 hours. CMP     Component Value Date/Time   NA 138 05/29/2021 0146   K 4.1 05/29/2021 0146   CL 106 05/29/2021 0146   CO2 24 05/29/2021 0146   GLUCOSE 93 05/29/2021 0146   BUN 23 (H) 05/29/2021 0146   CREATININE 1.26 (H) 05/29/2021 0146   CALCIUM 8.7 (L) 05/29/2021 0146   PROT 6.5 05/28/2021 0146   ALBUMIN 2.5 (L) 05/28/2021 0146   AST 278 (H) 05/28/2021 0146   ALT 476 (H) 05/28/2021 0146   ALKPHOS 389 (H) 05/28/2021 0146   BILITOT 1.1 05/28/2021 0146   GFRNONAA >60 05/29/2021 0146   GFRAA >60 10/24/2019 0954   Lipase     Component Value Date/Time   LIPASE 29 05/09/2021 1604       Studies/Results: No results found.  Anti-infectives: Anti-infectives (From admission, onward)    Start     Dose/Rate Route Frequency Ordered Stop   05/25/21 1000  fluconazole  (DIFLUCAN) tablet 200 mg       See Hyperspace for full Linked Orders Report.   200 mg Oral Daily 05/24/21 0834 06/07/21 0959   05/24/21 1000  fluconazole (DIFLUCAN) tablet 400 mg       See Hyperspace for full Linked Orders Report.   400 mg Oral  Once 05/24/21 0834 05/24/21 0913   06/02/2021 2145  cefTRIAXone (ROCEPHIN) 1 g in sodium chloride 0.9 % 100 mL IVPB        1 g 200 mL/hr over 30 Minutes Intravenous Every 24 hours 05/31/2021 2053 05/25/21 2212   05/06/2021 2145  azithromycin (ZITHROMAX) 500 mg in sodium chloride 0.9 % 250 mL IVPB        500 mg 250 mL/hr over 60 Minutes Intravenous Every 24 hours 05/30/2021 2053 05/10/2021 2322   05/26/2021 1545  ceFEPIme (MAXIPIME) 2 g in sodium chloride 0.9 % 100 mL IVPB        2 g 200 mL/hr over 30 Minutes Intravenous  Once 05/25/2021 1539 05/11/2021 1640        Assessment/Plan Prolapsed grade IV internal hemorrhoids Large pelvic mass - s/p colonoscopy on 05/12/2021 - CT abdomen/pelvis 05/25/21 for lung cancer staging reports Large mass within the  low pelvis encasing the distal rectum and herniating through the anus, highly concerning for malignancy. - MRI done yesterday - final read is pending this AM   FEN: regular ID: [id number] VTE: none   Stage IV non-small cell lung cancer with metastasis to pericardium undergoing radiation Malignant large pericardial effusion S/p open pericardial window by [name] 10/17 Sepsis/post obstructive PNA AKI COPD Severe malnutrition Candida esophagitis ABL anemia  LOS: 14 days    Norm Parcel, [facility] Surgery 05/30/2021, 8:40 AM Please see Amion for pager number during day hours 7:00am-4:30pm

## 2021-05-30 NOTE — Plan of Care (Signed)

## 2021-05-30 NOTE — Progress Notes (Signed)
Occupational Therapy Treatment Patient Details Name: Christopher Henry MRN: [id number] DOB: 06-01-1971 Today's Date: 05/30/2021   History of present illness The pt is a 50 yo male presenting 10/14 due to SOB with hypoxia. Pt also unintentional weight loss and hemoptysis x2.  Pt found to have pericardial effusion in the setting of locally advanced lung cancer as well as prolapsed grade IV hemorrhoids. Underwent pericardial window 10/17. s/p EGD on 10/18, initiation of radiation on 10/18. CT on 10/23 showed large pelvic mass.  PMH includes: tobacco use (x35 years) and HTN.   OT comments  Patient met lying supine in bed. Requires increased coaxing/encouragement for participation with therapy efforts. Attempted to assess patient goals but patient with grim outlook after recent conversation with MD. Encouraged patient to continue participating with therapy efforts to maximize independence with self-care tasks, tranfers and mobility as well as explore leisure tasks. Patient reports enjoying singing and states that he has recorded a few original songs. Patient in agreement with mobility in hospital room walking ~50-67f with use of RW and supervision A. Patient bumping into items in room several times. Questionable safety and walker awareness. Patient with request to return to supine instead of sitting in recliner. OT will continue to follow acutely.    Recommendations for follow up therapy are one component of a multi-disciplinary discharge planning process, led by the attending physician.  Recommendations may be updated based on patient status, additional functional criteria and insurance authorization.    Follow Up Recommendations  Home health OT    Assistance Recommended at Discharge Intermittent Supervision/Assistance  Equipment Recommendations  BSC;Other (comment) (rolling walker)    Recommendations for Other Services      Precautions / Restrictions Precautions Precautions: Fall       Mobility  Bed Mobility Overal bed mobility: Modified Independent                  Transfers Overall transfer level: Needs assistance Equipment used: Rolling walker (2 wheels) Transfers: Sit to/from Stand Sit to Stand: Supervision;Min guard           General transfer comment: Supervision A to stand from EOB and Min gaurd to sit for controlled descent.     Balance Overall balance assessment: Needs assistance   Sitting balance-Leahy Scale: Good       Standing balance-Leahy Scale: Poor Standing balance comment: reliant on at least 1 extremity for support.                           ADL either performed or assessed with clinical judgement   ADL                                         General ADL Comments: Declined all ADLs; MD asked to placed shower orders to encourage ADL participation.     Vision       Perception     Praxis      Cognition Arousal/Alertness: Awake/alert Behavior During Therapy: Flat affect Overall Cognitive Status: Within Functional Limits for tasks assessed                                            Exercises     Shoulder Instructions  General Comments Requires increased coaxing/encouragement for participation.    Pertinent Vitals/ Pain       Pain Assessment: 0-10 Pain Score: 4  Pain Location: abdomen and rectum (at rest) and bilateral knees (with mobility) Pain Descriptors / Indicators: Discomfort Pain Intervention(s): Limited activity within patient's tolerance;Monitored during session;Repositioned  Home Living                                          Prior Functioning/Environment              Frequency  Min 2X/week        Progress Toward Goals  OT Goals(current goals can now be found in the care plan section)  Progress towards OT goals: Progressing toward goals  Acute Rehab OT Goals OT Goal Formulation: With patient Time For Goal Achievement:  06/07/21 Potential to Achieve Goals: Fair ADL Goals Pt Will Perform Grooming: sitting;with modified independence Pt Will Perform Lower Body Bathing: with modified independence;sit to/from stand Pt Will Perform Lower Body Dressing: with modified independence;sit to/from stand;with adaptive equipment Pt Will Transfer to Toilet: with modified independence;ambulating;regular height toilet Pt Will Perform Toileting - Clothing Manipulation and hygiene: with modified independence;sitting/lateral leans  Plan Discharge plan remains appropriate;Frequency remains appropriate    Co-evaluation                 AM-PAC OT "6 Clicks" Daily Activity     Outcome Measure   Help from another person eating meals?: A Little Help from another person taking care of personal grooming?: A Little Help from another person toileting, which includes using toliet, bedpan, or urinal?: A Little Help from another person bathing (including washing, rinsing, drying)?: A Little Help from another person to put on and taking off regular upper body clothing?: A Little Help from another person to put on and taking off regular lower body clothing?: A Little 6 Click Score: 18    End of Session Equipment Utilized During Treatment: Rolling walker (2 wheels);Gait belt  OT Visit Diagnosis: Unsteadiness on feet (R26.81);Muscle weakness (generalized) (M62.81);Pain Pain - Right/Left: Right Pain - part of body: Knee (buttocks, abdomen)   Activity Tolerance Patient limited by fatigue   Patient Left in bed;with call bell/phone within reach;with bed alarm set   Nurse Communication Mobility status;Other (comment) (Response to treatment)        Time: 0131-4388 OT Time Calculation (min): 17 min  Charges: OT General Charges $OT Visit: 1 Visit OT Treatments $Therapeutic Activity: 8-22 mins  Christopher Henry H. OTR/L Supplemental OT, Department of rehab services [phone]  Christopher Henry R H. 05/30/2021, 8:51 AM

## 2021-05-31 DIAGNOSIS — C3490 Malignant neoplasm of unspecified part of unspecified bronchus or lung: Secondary | ICD-10-CM | POA: Diagnosis not present

## 2021-05-31 LAB — CBC
HCT: 27.9 % — ABNORMAL LOW (ref 39.0–52.0)
Hemoglobin: 8.7 g/dL — ABNORMAL LOW (ref 13.0–17.0)
MCH: 25.4 pg — ABNORMAL LOW (ref 26.0–34.0)
MCHC: 31.2 g/dL (ref 30.0–36.0)
MCV: 81.3 fL (ref 80.0–100.0)
Platelets: 520 10*3/uL — ABNORMAL HIGH (ref 150–400)
RBC: 3.43 MIL/uL — ABNORMAL LOW (ref 4.22–5.81)
RDW: 19.8 % — ABNORMAL HIGH (ref 11.5–15.5)
WBC: 20.6 10*3/uL — ABNORMAL HIGH (ref 4.0–10.5)
nRBC: 0 % (ref 0.0–0.2)

## 2021-05-31 LAB — BASIC METABOLIC PANEL
Anion gap: 9 (ref 5–15)
BUN: 22 mg/dL — ABNORMAL HIGH (ref 6–20)
CO2: 23 mmol/L (ref 22–32)
Calcium: 9 mg/dL (ref 8.9–10.3)
Chloride: 105 mmol/L (ref 98–111)
Creatinine, Ser: 1.25 mg/dL — ABNORMAL HIGH (ref 0.61–1.24)
GFR, Estimated: >60 mL/min (ref >60)
Glucose, Bld: 107 mg/dL — ABNORMAL HIGH (ref 70–99)
Potassium: 4.3 mmol/L (ref 3.5–5.1)
Sodium: 137 mmol/L (ref 135–145)

## 2021-05-31 LAB — MAGNESIUM: Magnesium: 2.2 mg/dL (ref 1.7–2.4)

## 2021-05-31 LAB — PHOSPHORUS: Phosphorus: 4.3 mg/dL (ref 2.5–4.6)

## 2021-05-31 MED ORDER — TRAMADOL HCL 50 MG PO TABS
50.0000 mg | ORAL_TABLET | Freq: Four times a day (QID) | ORAL | Status: DC | PRN
  Administered 2021-06-01 – 2021-06-02 (×2): 50 mg via ORAL
  Filled 2021-05-31 (×2): qty 1

## 2021-05-31 NOTE — Progress Notes (Signed)
PROGRESS NOTE    Christopher Henry  RCV:[id number] DOB: 05/16/1971 DOA: 06/01/2021 PCP: Bary Castilla, NP   Brief Narrative:  This 50 y.o. male with PMH of COPD, HTN, alcohol abuse, tobacco use 1 pack/day x 35 years sent from pulmonary office to ED for acute respiratory distress hypoxic 80% on room air, nonproductive cough and hematemesis over the past 2 months, painless rectal bleeding, chronic internal hemorrhoids with prolapse, burning sensation on the chest and greater than 50 pound weight loss x12 months.  Upon presentation to the ED, hemoglobin 6.9 K, WBC 30 K, D-dimer 3.5, initial troponin 49 lactic acidosis 2.4 creatinine 1.5 chest x-ray RLL pneumonia. CTA chest no PE, infiltrative mass of the mediastinum, complete collapse of the right lower lobe with associated complete right lower lobe bronchi occlusion due to endobronchial debris/lesion,?  Mass extension into the carina, left to right midline shift due to decreased lung volume-concern for malignancy likely of the mediastinum with extension into or around the right mainstem bronchi leading to collapse of the right lower lobe, mediastinal lymphadenopathy, 1.7 cm groundglass opacity right apex, small to moderate volume pericardial effusion, subacute to chronic fracture of the medial sternal body.   05/14/2021 underwent bronchoscopy which showed large subcarinal mass indenting the carina and bulging bilaterally with large mainstem lesion obscuring 50% of lumen, biopsies taken. Large pericardial effusion noted on 2D echo, seen by cardiology and cardiothoracic surgery, S/p left anterior thoracotomy with drainage of pericardial effusion -open pericardial window by [name] 05/28/2021.  05/29/2021-underwent EGD/colonoscopy found to have prolapsed grade 4 rectal hemorrhoids.  Also started radiation therapy.  Post CT abdomen and pelvis with contrast for staging.  It revealed a large mass within the lower pelvis identified encasing the distal rectum and  herniating through the anus highly concerning for malignancy.  05/26/21-Had radiation therapy then was transfused 2 units PRBCs for hemoglobin of 7.4 K.   Hospital course complicated by intermittent rectal bleeding in the setting of internal hemorrhoids with prolapse and possible rectal mass.  Seen by general surgery, no plans for surgical intervention at this time.  MRI pelvis completed. Consider endoscopic ultrasound for biopsy to further define the nature of his pelvic mass in order to guide further treatment or goals of care decisions.   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 Patient presented with large subcarinal/mediastinal mass extending into the carina, Mediastinal lymphadenopathy with right upper lobe nodule, Right lower lobe atelectasis with endobronchial lesion: S/p bronchoscopy 10/15- mass potential to cause central airway obstruction. biopsy showed atypical cells. Rad Onc has seen and radiotherapy started  on 10/19 urgently.  Second radiation session on 05/26/2021.  Medical oncology input appreciated started initial staging with MRI of the brain with and without-that was normal, Biopsy of pericardium showed metastatic carcinoma. Palliative care consulted to discuss goals of care.  Patient wants to treat the treatable. Patient wants to be full code.  Malignant large pericardial effusion.: S/p open pericardial window by [name] 05/10/2021.   Drain removed on 05/22/21 and chest x-ray no pneumothorax.  Pericardial biopsy showed metastatic carcinoma.  Continue colchicine. Pain is well controlled with current pain management.   Pelvic mass: Large mass within the lower pelvis identified encasing the distal rectum and herniated through the anus with concern for malignancy. MRI pelvis with and without contrast ordered on 05/27/2021. Medical oncology and general surgery following. Surgery recommended consider endoscopic ultrasound for biopsy to further define the nature of pelvic mass in order to guide further treatment or goals of care decisions.   Chronic blood loss anemia in the setting of chronic internal hemorrhoids. Transfused 2 units PRBCs on 05/26/2021 for hemoglobin of 7.4 K. Continue to monitor H&H.  Hemoglobin today morning 8.7.   Chest discomfort: S/p pericardial window for malignant pericardial effusion Pain is improved with leaning forward. Continue colchicine. Continue as needed narcotic analgesics.   Leukocytosis suspect multifactorial.   Leukocytosis is downtrending, 18.5 from 30.6. Completed Rocephin for CAP X10 days, last day 05/26/2021. Remains afebrile. Continue to monitor.   Acute hypoxic respiratory failure > resolved. He was hypoxic 80% on room air with respiratory distress on admission 2/2 pneumonia/mediastinal mass.  Currently on room air with O2 saturation 96%.   Sepsis secondary to pneumonia /postobstructive pneumonia: In the setting of malignancy.  Completed antibiotics course.  Hematochezia/melena Acute blood loss anemia in the setting of painless rectal bleeding due to chronic large internal hemorrhoids with prolapse.   Anemia of chronic disease  S/P multiple PRBC transfusions.  Hb. Stable now. Transfuse if less than 7 g.   S/p EGD colonoscopy 10/18-mild esophagitis-positive for Candida esophagitis- large grade 4 prolapsed hemorrhoids-general surgery consulted and advised medical and topical care no plan for surgery at this time- w/ H prep, sitz bath  (patient refusing bath).  Transfuse to keep hemoglobin above 8 g may need additional transfusion prior to discharge.   Candida esophagitis: Continue fluconazole   Dysphagia Having trouble swallowing his p.o. medications. Feeling of food getting stuck in his throat. Speech therapist consulted for swallow evaluation. Aspiration precautions in place.   Hypomagnesemia: Replaced and resolved.   AKI : Resolved, again creatinine up to 1.26 Continue iv gentle hydration   COPD: Currently not in exacerbation. Continue Dulera,Incruse Ellipta and bronchodilators as needed   Severe protein calorie malnutrition with significant weight loss and BMI 16.45.   Severe muscle mass loss. Albumin 3.3 on 05/07/2021. In the setting of malignancy overall poor prognostic indicator. Appreciate dietitian's assistance. Continue Marinol Continue to encourage increase oral protein calorie intake.   History of alcohol use/tobacco use :  Continue thiamine folic acid multivitamins.   Deconditioning:  Evaluated by PT recommendation for home health PT. Continue PT OT with assistance    Nutrition Problem: Severe Malnutrition Etiology: acute illness (newly diagnosed lung cancer) Signs/Symptoms: severe muscle depletion, severe fat depletion, percent weight loss (6% weight loss in one month) Percent weight loss: 6 % Interventions: MVI, Magic cup, Ensure Enlive (each supplement provides 350kcal and 20 grams of protein)   Thrombocytosis, improving, suspect in the setting of acute illness. Platelet count downtrending 520 K from 740 K. Continue to monitor.  DVT prophylaxis: SCDs Code Status: Full code. Family Communication: No family at bed side. Disposition Plan:   Status is: Inpatient  Remains inpatient appropriate because: Pending work-up for malignancy.   Anticipated discharge in 3 days.  Consultants:  Palliative care Oncology General surgery  Procedures:  Antimicrobials:   Anti-infectives  (From admission, onward)    Start     Dose/Rate Route Frequency Ordered Stop   05/25/21 1000  fluconazole (DIFLUCAN) tablet 200 mg       See Hyperspace for full Linked Orders Report.   200 mg Oral Daily 05/24/21 0834 06/07/21 0959   05/24/21 1000  fluconazole (DIFLUCAN) tablet 400 mg       See Hyperspace for full Linked Orders Report.   400 mg Oral  Once 05/24/21 0834 05/24/21 0913   05/29/2021 2145  cefTRIAXone (ROCEPHIN) 1 g in sodium chloride 0.9 % 100 mL IVPB        1 g 200 mL/hr over 30 Minutes Intravenous Every 24 hours 05/12/2021 2053 05/25/21 2212   06/02/2021 2145  azithromycin (ZITHROMAX) 500 mg in sodium chloride 0.9 % 250 mL IVPB        500 mg 250 mL/hr over 60 Minutes Intravenous Every 24 hours 05/24/2021 2053 05/23/2021 2322   05/06/2021 1545  ceFEPIme (MAXIPIME) 2 g in sodium chloride 0.9 % 100 mL IVPB        2 g 200 mL/hr over 30 Minutes Intravenous  Once 05/15/2021 1539 05/31/2021 1640        Subjective: Patient was seen and examined at bedside.  Overnight events noted.   Patient was sitting comfortably on the bed, reports having some chest discomfort but denies any shortness of breath. He reports cough is getting better.  Pain is reasonably controlled.  Objective: Vitals:   05/31/21 0319 05/31/21 0905 05/31/21 0912 05/31/21 1207  BP: 104/71  108/79 108/80  Pulse: 91  98 100  Resp: 19  20 18   Temp: 98.6 F (37 C)  98 F (36.7 C) 97.9 F (36.6 C)  TempSrc: Oral  Oral Oral  SpO2: 97% 98% 98% 99%  Weight:      Height:        Intake/Output Summary (Last 24 hours) at 05/31/2021 1421 Last data filed at 05/30/2021 1958 Gross per 24 hour  Intake --  Output 400 ml  Net -400 ml   Filed Weights   06/01/2021 1948 05/29/21 0243  Weight: 61.3 kg 61.3 kg    Examination:  General exam: Appears comfortable, chronically ill looking.  Not in any distress Respiratory system: Clear to auscultation. Respiratory effort normal.  RR 15 Cardiovascular system: S1-S2 heard, regular rate  and rhythm, no murmur. Gastrointestinal system: Abdomen is soft, nontender, nondistended, BS + Central nervous system: Alert and oriented X 3. No focal neurological deficits. Extremities: No edema, no cyanosis, no clubbing. Skin: No rashes, lesions or ulcers Psychiatry: Judgement and insight appear normal. Mood & affect appropriate.     Data Reviewed: I have personally reviewed following labs and imaging studies  CBC: Recent Labs  Lab 05/26/21 0343 05/27/21 0135 05/28/21 0146 05/29/21 0146 05/31/21 0048  WBC 30.6* 21.9* 22.2* 18.5* 20.6*  HGB 7.4* 8.9* 8.6* 8.6* 8.7*  HCT 23.2* 27.4* 26.7* 27.0* 27.9*  MCV 77.3* 78.5* 79.2* 80.1 81.3  PLT 740* 545* 502* 490* 027*   Basic Metabolic Panel: Recent Labs  Lab 05/28/21 0146 05/29/21 0146 05/31/21 0048  NA 138 138 137  K 4.8 4.1 4.3  CL 106 106 105  CO2 22 24 23   GLUCOSE 91 93 107*  BUN 33* 23* 22*  CREATININE 1.58* 1.26* 1.25*  CALCIUM 8.6* 8.7* 9.0  MG 2.2 2.3 2.2  PHOS 3.6 4.2 4.3   GFR: Estimated Creatinine Clearance: 61.3 mL/min (A) (by C-G formula based on SCr of 1.25 mg/dL (H)). Liver Function Tests: Recent Labs  Lab 05/28/21 0146  AST 278*  ALT 476*  ALKPHOS 389*  BILITOT 1.1  PROT 6.5  ALBUMIN 2.5*   No results for input(s): LIPASE, AMYLASE in the last 168 hours. No results for input(s): AMMONIA in the last 168 hours. Coagulation Profile: No results for input(s): INR, PROTIME in the last 168 hours. Cardiac Enzymes: No results for input(s): CKTOTAL, CKMB, CKMBINDEX, TROPONINI in the last 168 hours. BNP (last 3 results) No results for input(s): PROBNP in the last 8760 hours. HbA1C: No results for input(s): HGBA1C in the last 72 hours. CBG: No results for input(s): GLUCAP in the last 168 hours. Lipid Profile: No results for input(s): CHOL, HDL, LDLCALC, TRIG, CHOLHDL, LDLDIRECT in the last 72 hours. Thyroid Function Tests: No results for input(s): TSH, T4TOTAL, FREET4, T3FREE, THYROIDAB in the last  72 hours. Anemia Panel: No results for input(s): VITAMINB12, FOLATE, FERRITIN, TIBC, IRON, RETICCTPCT in the last 72 hours. Sepsis Labs: Recent Labs  Lab 05/25/21 0128  PROCALCITON 0.23    No results found for this or any previous visit (from the past 240 hour(s)).   Radiology Studies: No results found.   Scheduled Meds:  (feeding supplement) PROSource Plus  30 mL Oral BID BM   colchicine  0.3 mg Oral Daily   dronabinol  2.5 mg Oral BID AC   feeding supplement  237 mL Oral QID   fluconazole  200 mg Oral Daily   folic acid  1 mg Oral Daily   hydrocortisone  25 mg Rectal BID   hydrocortisone-pramoxine  1 applicator Rectal BID   LORazepam  1 mg Intravenous Once   mometasone-formoterol  2 puff Inhalation BID   And   umeclidinium bromide  1 puff Inhalation Daily   multivitamin with minerals  1 tablet Oral Daily   pantoprazole sodium  40 mg Oral BID   potassium chloride  20 mEq Oral Daily   thiamine  100 mg Oral Daily   Or   thiamine  100 mg Intravenous Daily   Continuous Infusions:  sodium chloride       LOS: 15 days    Time spent: 25 mins    Shawna Clamp, MD [facility]   If 7PM-7AM, please contact night-coverage

## 2021-05-31 NOTE — Progress Notes (Signed)
   Palliative Medicine Inpatient Follow Up Note  Chart reviewed.    Patient had been clear about his wishes to continue full code/full efforts for improvement.    If [place] should decline or any additional changes occur we will become reinvolved.  In the meanwhile we will continue to shadow the chart.  No Charge ______________________________________________________________________________________ [place] Team Team Cell Phone: [phone] Please utilize secure chat with additional questions, if there is no response within 30 minutes please call the above phone number  Palliative Medicine Team providers are available by phone from 7am to 7pm daily and can be reached through the team cell phone.  Should this patient require assistance outside of these hours, please call the patient's attending physician.

## 2021-05-31 NOTE — Progress Notes (Signed)
Occupational Therapy Treatment Patient Details Name: Timothey Dahlstrom MRN: [id number] DOB: 1971-02-21 Today's Date: 05/31/2021   History of present illness The pt is a 50 yo male presenting 10/14 due to SOB with hypoxia. Pt also unintentional weight loss and hemoptysis x2.  Pt found to have pericardial effusion in the setting of locally advanced lung cancer as well as prolapsed grade IV hemorrhoids. Underwent pericardial window 10/17. s/p EGD on 10/18, initiation of radiation on 10/18. CT on 10/23 showed large pelvic mass.  PMH includes: tobacco use (x35 years) and HTN.   OT comments  Patient received in bed and agreeable to work with OT for self care and shower.  Patient was able to get to eob without assistance and was supervision to ambulate to bathroom with RW. Patient was able to perform toileting tasks and doffing clothing with use of rail for balance and was min guard for shower transfer due to wet surface. Patient performed dressing from eob due to fatigue. Patient is making good progress with OT services and will continue to be followed by acute OT.    Recommendations for follow up therapy are one component of a multi-disciplinary discharge planning process, led by the attending physician.  Recommendations may be updated based on patient status, additional functional criteria and insurance authorization.    Follow Up Recommendations  Home health OT    Assistance Recommended at Discharge Intermittent Supervision/Assistance  Equipment Recommendations  BSC;Other (comment)    Recommendations for Other Services      Precautions / Restrictions Precautions Precautions: Fall Restrictions Weight Bearing Restrictions: No       Mobility Bed Mobility Overal bed mobility: Modified Independent             General bed mobility comments: able to get out and into bed without assistance    Transfers Overall transfer level: Needs assistance Equipment used: Rolling walker (2  wheels) Transfers: Sit to/from Stand Sit to Stand: Supervision Stand pivot transfers: Min guard         General transfer comment: min guard with shower transfers and verbal cues for safety with all transfers     Balance Overall balance assessment: Needs assistance Sitting-balance support: Feet supported;No upper extremity supported Sitting balance-Leahy Scale: Good     Standing balance support: Single extremity supported;During functional activity Standing balance-Leahy Scale: Poor Standing balance comment: used 1 extremity for balance when standing for toilet hygiene and standing in shower                           ADL either performed or assessed with clinical judgement   ADL Overall ADL's : Needs assistance/impaired         Upper Body Bathing: Supervision/ safety;Sitting Upper Body Bathing Details (indicate cue type and reason): performed seated in shower Lower Body Bathing: Supervison/ safety;Sitting/lateral leans;Sit to/from stand Lower Body Bathing Details (indicate cue type and reason): performed in shower seated and standing from [facility] in shower Upper Body Dressing : Set up Upper Body Dressing Details (indicate cue type and reason): donned gown Lower Body Dressing: Supervision/safety;Sit to/from stand;Sitting/lateral leans Lower Body Dressing Details (indicate cue type and reason): able to doff and donn pull up brief and socks Toilet Transfer: Supervision/safety;Regular Toilet;Rolling walker (2 wheels) Toilet Transfer Details (indicate cue type and reason): verbal cues for safety Toileting- Clothing Manipulation and Hygiene: Supervision/safety;Sit to/from stand Toileting - Clothing Manipulation Details (indicate cue type and reason): performed toilet hygiene standing using rail for support Tub/  Shower Transfer: Walk-in shower;Grab bars;Min Chief Executive Officer Details (indicate cue type and reason): min guard for safety due to wet surface Functional  mobility during ADLs: Supervision/safety;Rolling walker (2 wheels) General ADL Comments: patient was able to perform shower with BSC in shower     Vision       Perception     Praxis      Cognition Arousal/Alertness: Awake/alert Behavior During Therapy: Flat affect Overall Cognitive Status: Within Functional Limits for tasks assessed                                 General Comments: lethargic when OT entered room          Exercises     Shoulder Instructions       General Comments      Pertinent Vitals/ Pain       Pain Assessment: Faces Faces Pain Scale: Hurts little more Pain Location: abdomen and rectum Pain Descriptors / Indicators: Discomfort Pain Intervention(s): Monitored during session  Home Living                                          Prior Functioning/Environment              Frequency  Min 2X/week        Progress Toward Goals  OT Goals(current goals can now be found in the care plan section)  Progress towards OT goals: Progressing toward goals  Acute Rehab OT Goals OT Goal Formulation: With patient Time For Goal Achievement: 06/07/21 Potential to Achieve Goals: Fair ADL Goals Pt Will Perform Grooming: sitting;with modified independence Pt Will Perform Lower Body Bathing: with modified independence;sit to/from stand Pt Will Perform Lower Body Dressing: with modified independence;sit to/from stand;with adaptive equipment Pt Will Transfer to Toilet: with modified independence;ambulating;regular height toilet Pt Will Perform Toileting - Clothing Manipulation and hygiene: with modified independence;sitting/lateral leans  Plan Discharge plan remains appropriate;Frequency remains appropriate    Co-evaluation                 AM-PAC OT "6 Clicks" Daily Activity     Outcome Measure   Help from another person eating meals?: A Little Help from another person taking care of personal grooming?: A  Little Help from another person toileting, which includes using toliet, bedpan, or urinal?: A Little Help from another person bathing (including washing, rinsing, drying)?: A Little Help from another person to put on and taking off regular upper body clothing?: A Little Help from another person to put on and taking off regular lower body clothing?: A Little 6 Click Score: 18    End of Session Equipment Utilized During Treatment: Rolling walker (2 wheels);Gait belt  OT Visit Diagnosis: Unsteadiness on feet (R26.81);Muscle weakness (generalized) (M62.81);Pain   Activity Tolerance Patient limited by fatigue   Patient Left in bed;with call bell/phone within reach;with bed alarm set   Nurse Communication Mobility status        Time: 4801-6553 OT Time Calculation (min): 43 min  Charges: OT General Charges $OT Visit: 1 Visit OT Treatments $Self Care/Home Management : 38-52 mins  Lodema Hong, [place]  Pager [phone] Office [place] 05/31/2021, 1:16 PM

## 2021-05-31 NOTE — Plan of Care (Signed)

## 2021-06-01 DIAGNOSIS — Z66 Do not resuscitate: Secondary | ICD-10-CM | POA: Diagnosis not present

## 2021-06-01 DIAGNOSIS — Z515 Encounter for palliative care: Secondary | ICD-10-CM | POA: Diagnosis not present

## 2021-06-01 DIAGNOSIS — Z7189 Other specified counseling: Secondary | ICD-10-CM | POA: Diagnosis not present

## 2021-06-01 DIAGNOSIS — C3490 Malignant neoplasm of unspecified part of unspecified bronchus or lung: Secondary | ICD-10-CM | POA: Diagnosis not present

## 2021-06-01 LAB — PHOSPHORUS: Phosphorus: 5.1 mg/dL — ABNORMAL HIGH (ref 2.5–4.6)

## 2021-06-01 LAB — BASIC METABOLIC PANEL
Anion gap: 15 (ref 5–15)
BUN: 25 mg/dL — ABNORMAL HIGH (ref 6–20)
CO2: 17 mmol/L — ABNORMAL LOW (ref 22–32)
Calcium: 9.1 mg/dL (ref 8.9–10.3)
Chloride: 105 mmol/L (ref 98–111)
Creatinine, Ser: 1.35 mg/dL — ABNORMAL HIGH (ref 0.61–1.24)
GFR, Estimated: >60 mL/min (ref >60)
Glucose, Bld: 105 mg/dL — ABNORMAL HIGH (ref 70–99)
Potassium: 5.1 mmol/L (ref 3.5–5.1)
Sodium: 137 mmol/L (ref 135–145)

## 2021-06-01 LAB — CBC
HCT: 28.1 % — ABNORMAL LOW (ref 39.0–52.0)
Hemoglobin: 8.9 g/dL — ABNORMAL LOW (ref 13.0–17.0)
MCH: 25.5 pg — ABNORMAL LOW (ref 26.0–34.0)
MCHC: 31.7 g/dL (ref 30.0–36.0)
MCV: 80.5 fL (ref 80.0–100.0)
Platelets: 487 10*3/uL — ABNORMAL HIGH (ref 150–400)
RBC: 3.49 MIL/uL — ABNORMAL LOW (ref 4.22–5.81)
RDW: 20 % — ABNORMAL HIGH (ref 11.5–15.5)
WBC: 22.4 10*3/uL — ABNORMAL HIGH (ref 4.0–10.5)
nRBC: 0 % (ref 0.0–0.2)

## 2021-06-01 LAB — MAGNESIUM: Magnesium: 2.2 mg/dL (ref 1.7–2.4)

## 2021-06-01 NOTE — Social Work (Signed)
CSW was alerted that pt's girlfriend had some questions. CSW spoke with pt's girlfriend(Verna) and she inquired about a notary for the POA paperwork as well as holding their marriage ceremony at the hospital. CSW informed her that there was a Patent examiner today and she could that paper work notarized.  CSW informed Dolores Lory) that CSW could reach out to the [place] about the marriage ceremony. She stated that she would also like to have th [facility] come tomorrow as well.  CSW reached out to the [place] on call to alert her of the request of the pt and girlfriend for follow up tomorrow.

## 2021-06-01 NOTE — Progress Notes (Signed)
PROGRESS NOTE    Christopher Henry  XTK:[id number] DOB: 05-11-1971 DOA: 05/12/2021 PCP: Bary Castilla, NP   Brief Narrative:  This 50 y.o. male with PMH of COPD, HTN, alcohol abuse, tobacco use 1 pack/day x 35 years sent from pulmonary office to ED for acute respiratory distress hypoxic 80% on room air, nonproductive cough and hematemesis over the past 2 months, painless rectal bleeding, chronic internal hemorrhoids with prolapse, burning sensation on the chest and greater than 50 pound weight loss x12 months.  Upon presentation to the ED, hemoglobin 6.9 K, WBC 30 K, D-dimer 3.5, initial troponin 49 lactic acidosis 2.4 creatinine 1.5 chest x-ray RLL pneumonia. CTA chest no PE, infiltrative mass of the mediastinum, complete collapse of the right lower lobe with associated complete right lower lobe bronchi occlusion due to endobronchial debris/lesion,?  Mass extension into the carina, left to right midline shift due to decreased lung volume-concern for malignancy likely of the mediastinum with extension into or around the right mainstem bronchi leading to collapse of the right lower lobe, mediastinal lymphadenopathy, 1.7 cm groundglass opacity right apex, small to moderate volume pericardial effusion, subacute to chronic fracture of the medial sternal body.   05/03/2021 underwent bronchoscopy which showed large subcarinal mass indenting the carina and bulging bilaterally with large mainstem lesion obscuring 50% of lumen, biopsies taken. Large pericardial effusion noted on 2D echo, seen by cardiology and cardiothoracic surgery, S/p left anterior thoracotomy with drainage of pericardial effusion -open pericardial window by [name] 05/31/2021.  05/23/2021-underwent EGD/colonoscopy found to have prolapsed grade 4 rectal hemorrhoids.  Also started radiation therapy.  Post CT abdomen and pelvis with contrast for staging.  It revealed a large mass within the lower pelvis identified encasing the distal rectum and  herniating through the anus highly concerning for malignancy.  05/26/21-Had radiation therapy then was transfused 2 units PRBCs for hemoglobin of 7.4 K.   Hospital course complicated by intermittent rectal bleeding in the setting of internal hemorrhoids with prolapse and possible rectal mass.  Seen by general surgery, no plans for surgical intervention at this time.  MRI pelvis completed. Consider endoscopic ultrasound for biopsy to further define the nature of his pelvic mass in order to guide further treatment or goals of care decisions.   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 Patient presented with large subcarinal/mediastinal mass extending into the carina, Mediastinal lymphadenopathy with right upper lobe nodule, Right lower lobe atelectasis with endobronchial lesion: S/p bronchoscopy 10/15- mass potential to cause central airway obstruction. biopsy showed atypical cells. Rad Onc has seen and radiotherapy started  on 10/19 urgently.  Second radiation session on 05/26/2021.  Medical oncology input appreciated started initial staging with MRI of the brain with and without-that was normal, Biopsy of pericardium showed metastatic carcinoma. Palliative care consulted to discuss goals of care.  Patient wants to treat the treatable. Conversation in process to discuss hospice care.  Malignant large pericardial effusion.: S/p open pericardial window by [name] 05/28/2021.   Drain removed on 05/22/21 and chest x-ray no  pneumothorax.   Pericardial biopsy showed metastatic carcinoma.  Continue colchicine. Pain is well controlled with current pain management.   Pelvic mass: Large mass within the lower pelvis identified encasing the distal rectum and herniated through the anus with concern for malignancy. MRI pelvis with and without contrast ordered on 05/27/2021. Medical oncology and general surgery following. Surgery recommended consider endoscopic ultrasound for biopsy to further define the nature of pelvic mass in order to guide further treatment or goals of care decisions.   Chronic blood loss anemia in the setting of chronic internal hemorrhoids. Transfused 2 units PRBCs on 05/26/2021 for hemoglobin of 7.4 K. Continue to monitor H&H.  Hemoglobin today morning 8.9.   Chest discomfort: S/p pericardial window for malignant pericardial effusion Pain is improved with leaning forward. Continue colchicine. Continue as needed narcotic analgesics.   Leukocytosis suspect multifactorial.   Leukocytosis is downtrending, 18.5 from 30.6. Completed Rocephin for CAP X10 days, last day 05/26/2021. Remains afebrile. Continue to monitor.   Acute hypoxic respiratory failure > resolved. He was hypoxic 80% on room air with respiratory distress on admission 2/2 pneumonia/mediastinal mass.  Currently on room air with O2 saturation 96%.   Sepsis secondary to pneumonia /postobstructive pneumonia: In the setting of malignancy.  Completed antibiotics course.  Hematochezia/melena Acute blood loss anemia in the setting of painless rectal bleeding due to chronic large internal hemorrhoids with prolapse.   Anemia of chronic disease  S/P multiple PRBC transfusions.  Hb. Stable now. Transfuse if less than 7 g.   S/p EGD colonoscopy 10/18-mild esophagitis-positive for Candida esophagitis- large grade 4 prolapsed hemorrhoids-general surgery consulted and advised medical and topical care no plan for surgery at this time- w/ H  prep, sitz bath (patient refusing bath).  Transfuse to keep hemoglobin above 8 g may need additional transfusion prior to discharge.   Candida esophagitis: Continue fluconazole   Dysphagia Having trouble swallowing his p.o. medications. Feeling of food getting stuck in his throat. Speech therapist consulted for swallow evaluation. Aspiration precautions in place.   Hypomagnesemia: Replaced and resolved.   AKI : Resolved, again creatinine up to 1.26 Continue iv gentle hydration   COPD: Currently not in exacerbation. Continue Dulera,Incruse Ellipta and bronchodilators as needed   Severe protein calorie malnutrition with significant weight loss and BMI 16.45.   Severe muscle mass loss. Albumin 3.3 on 05/09/2021. In the setting of malignancy overall poor prognostic indicator. Appreciate dietitian's assistance. Continue Marinol Continue to encourage increase oral protein calorie intake.   History of alcohol use/tobacco use :  Continue thiamine folic acid multivitamins.   Deconditioning:  Evaluated by PT recommendation for home health PT. Continue PT OT with assistance    Nutrition Problem: Severe Malnutrition Etiology: acute illness (newly diagnosed lung cancer) Signs/Symptoms: severe muscle depletion, severe fat depletion, percent weight loss (6% weight loss in one month) Percent weight loss: 6 % Interventions: MVI, Magic cup, Ensure Enlive (each supplement provides 350kcal and 20 grams of protein)   Thrombocytosis, improving, suspect in the setting of acute illness. Platelet count downtrending 520 K from 740 K. Continue to monitor.  DVT prophylaxis: SCDs Code Status: Full code. Family Communication: No family at bed side. Disposition Plan:   Status is: Inpatient  Remains inpatient appropriate because: Pending work-up for malignancy. Needs goals of care discussion.    Anticipated discharge in 3 days.  Consultants:  Palliative care Oncology General  surgery  Procedures:  Antimicrobials:   Anti-infectives (From admission, onward)    Start     Dose/Rate  Route Frequency Ordered Stop   05/25/21 1000  fluconazole (DIFLUCAN) tablet 200 mg       See Hyperspace for full Linked Orders Report.   200 mg Oral Daily 05/24/21 0834 06/07/21 0959   05/24/21 1000  fluconazole (DIFLUCAN) tablet 400 mg       See Hyperspace for full Linked Orders Report.   400 mg Oral  Once 05/24/21 0834 05/24/21 0913   05/12/2021 2145  cefTRIAXone (ROCEPHIN) 1 g in sodium chloride 0.9 % 100 mL IVPB        1 g 200 mL/hr over 30 Minutes Intravenous Every 24 hours 05/13/2021 2053 05/25/21 2212   06/02/2021 2145  azithromycin (ZITHROMAX) 500 mg in sodium chloride 0.9 % 250 mL IVPB        500 mg 250 mL/hr over 60 Minutes Intravenous Every 24 hours 05/03/2021 2053 05/21/2021 2322   05/15/2021 1545  ceFEPIme (MAXIPIME) 2 g in sodium chloride 0.9 % 100 mL IVPB        2 g 200 mL/hr over 30 Minutes Intravenous  Once 05/28/2021 1539 06/01/2021 1640        Subjective: Patient was seen and examined at bedside.  Overnight events noted.   Patient reports having central chest pain associated with some discomfort.  He reports cough is getting better.  Pain is reasonably controlled.  Objective: Vitals:   06/01/21 0321 06/01/21 0734 06/01/21 0757 06/01/21 1154  BP: 103/81 110/65    Pulse: 100 (!) 108  (!) (P) 108  Resp: 20 20  (P) 20  Temp: 98 F (36.7 C) 98.8 F (37.1 C)  (P) 97.6 F (36.4 C)  TempSrc: Oral Axillary  (P) Oral  SpO2: 97% 97% 97%   Weight:      Height:        Intake/Output Summary (Last 24 hours) at 06/01/2021 1446 Last data filed at 06/01/2021 0000 Gross per 24 hour  Intake 120 ml  Output --  Net 120 ml   Filed Weights   05/18/2021 1948 05/29/21 0243  Weight: 61.3 kg 61.3 kg    Examination:  General exam: Appears comfortable, chronically ill looking.  Not in any distress. Respiratory system: Clear to auscultation. Respiratory effort normal.  RR  15 Cardiovascular system: S1-S2 heard, regular rate and rhythm, no murmur. Gastrointestinal system: Abdomen is soft, nontender, nondistended, BS + Central nervous system: Alert and oriented X 3. No focal neurological deficits. Extremities: No edema, no cyanosis, no clubbing. Skin: No rashes, lesions or ulcers Psychiatry: Judgement and insight appear normal. Mood & affect appropriate.     Data Reviewed: I have personally reviewed following labs and imaging studies  CBC: Recent Labs  Lab 05/27/21 0135 05/28/21 0146 05/29/21 0146 05/31/21 0048 06/01/21 0436  WBC 21.9* 22.2* 18.5* 20.6* 22.4*  HGB 8.9* 8.6* 8.6* 8.7* 8.9*  HCT 27.4* 26.7* 27.0* 27.9* 28.1*  MCV 78.5* 79.2* 80.1 81.3 80.5  PLT 545* 502* 490* 520* 163*   Basic Metabolic Panel: Recent Labs  Lab 05/28/21 0146 05/29/21 0146 05/31/21 0048 06/01/21 0436  NA 138 138 137 137  K 4.8 4.1 4.3 5.1  CL 106 106 105 105  CO2 22 24 23  17*  GLUCOSE 91 93 107* 105*  BUN 33* 23* 22* 25*  CREATININE 1.58* 1.26* 1.25* 1.35*  CALCIUM 8.6* 8.7* 9.0 9.1  MG 2.2 2.3 2.2 2.2  PHOS 3.6 4.2 4.3 5.1*   GFR: Estimated Creatinine Clearance: 56.8 mL/min (A) (by C-G formula based on SCr of 1.35 mg/dL (H)). Liver  Function Tests: Recent Labs  Lab 05/28/21 0146  AST 278*  ALT 476*  ALKPHOS 389*  BILITOT 1.1  PROT 6.5  ALBUMIN 2.5*   No results for input(s): LIPASE, AMYLASE in the last 168 hours. No results for input(s): AMMONIA in the last 168 hours. Coagulation Profile: No results for input(s): INR, PROTIME in the last 168 hours. Cardiac Enzymes: No results for input(s): CKTOTAL, CKMB, CKMBINDEX, TROPONINI in the last 168 hours. BNP (last 3 results) No results for input(s): PROBNP in the last 8760 hours. HbA1C: No results for input(s): HGBA1C in the last 72 hours. CBG: No results for input(s): GLUCAP in the last 168 hours. Lipid Profile: No results for input(s): CHOL, HDL, LDLCALC, TRIG, CHOLHDL, LDLDIRECT in the last  72 hours. Thyroid Function Tests: No results for input(s): TSH, T4TOTAL, FREET4, T3FREE, THYROIDAB in the last 72 hours. Anemia Panel: No results for input(s): VITAMINB12, FOLATE, FERRITIN, TIBC, IRON, RETICCTPCT in the last 72 hours. Sepsis Labs: No results for input(s): PROCALCITON, LATICACIDVEN in the last 168 hours.   No results found for this or any previous visit (from the past 240 hour(s)).   Radiology Studies: No results found.   Scheduled Meds:  (feeding supplement) PROSource Plus  30 mL Oral BID BM   colchicine  0.3 mg Oral Daily   dronabinol  2.5 mg Oral BID AC   feeding supplement  237 mL Oral QID   fluconazole  200 mg Oral Daily   folic acid  1 mg Oral Daily   hydrocortisone  25 mg Rectal BID   hydrocortisone-pramoxine  1 applicator Rectal BID   LORazepam  1 mg Intravenous Once   mometasone-formoterol  2 puff Inhalation BID   And   umeclidinium bromide  1 puff Inhalation Daily   multivitamin with minerals  1 tablet Oral Daily   pantoprazole sodium  40 mg Oral BID   thiamine  100 mg Oral Daily   Or   thiamine  100 mg Intravenous Daily   Continuous Infusions:  sodium chloride       LOS: 16 days    Time spent: 25 mins    Shawna Clamp, MD [facility]   If 7PM-7AM, please contact night-coverage

## 2021-06-01 NOTE — Progress Notes (Addendum)
Palliative Medicine Inpatient Follow Up Note  Consulting Provider: Kayleen Memos, DO   Reason for consult:   [place] Palliative Medicine Consult  Reason for Consult? to assist with establlishing goals of care    HPI:  Per intake H&P --> Christopher Henry, 50 y.o. male with PMH of COPD, HTN, alcohol abuse, tobacco use 1 pack/day x 35 years sent from pulmonary office to ED for acute respiratory distress hypoxic 80% on room air, nonproductive cough and hematemesis over the past 2 months, painless rectal bleeding, chronic internal hemorrhoids with prolapse, burning sensation on the chest and greater than 50 pound weight loss x12 months. Identified to have metastatic non-small cell lung cancer. Oncology is involved he receives radiation with Dr. Lisbeth Renshaw. The Oncology team is considering him for additional therapies.   Palliative care has been asked to get involved to discuss goals of care in light of this diagnosis.   Today's Discussion (06/01/2021):  *Please note that this is a verbal dictation therefore any spelling or grammatical errors are due to the "[place] [employer]" system interpretation.  Chart reviewed.  I met with Christopher Henry at bedside this morning and he appears to be doing quite poorly.  He endorses ongoing pain in the right side of his chest and persistent failure to thrive.  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  I expressed that we still have not gotten a comprehensive response as to what his treatment would be though looking at him right now I am worried about his ability to tolerate any treatment at all.  I asked Christopher Henry if I could call his fiance and discussed with her my concerns which he is open to. __________________________________________________________________________ Addendum:  Later in the morning I called patient's fianc, Christopher Henry.  I shared that as of presently Christopher Henry looks very poorly.   We reviewed that he has cancer with metastatic components and I am concerned overall that he may continue to decline.  I introduced topic such as hospice which she was very tearful at though understanding towards.  Christopher Henry shares with me that there are a couple of financial elements that are worrisome and she needs to speak to one of her medical social workers to help work through.  Christopher Henry reviews that she was planning to marry Christopher Henry in April and it is devastating that he is in the state.  She shared that she would like to try to marry him sooner than later if that would be something that could be coordinated.  Questions and concerns addressed  _________________________________________________________________________ Family Meeting:  I met with Christopher Henry and his fiance, Christopher Henry at bedside. We reviewed that Christopher Henry has Stg IV NCLC, he has had a malignant pericardial effusion, and now has a large pelvic mass. We reviewed that his fatigue, pain, and lack of appetite are likely in relationship with his underlying cancer. We reviewed that we are awaiting additional insights from Oncology but any additional treatment he and his fiance will need to weigh the risks and benefits. I shared that it is likely that no matter what avenue is selected that Christopher Henry's time here on earth will be shortened.   I introduced hospice as a service for patients who have a life expectancy of < 6 months. It preserves dignity and quality at the end phases of life. The focus changes from curative to symptom relief. Patient and his fiance are familiar with this and would be open to  it if the Oncology team recommends this.   Patients fiance was very tearful. I offered her time to express her emotional through the use of therapeutic silence.   We further discussed the idea of resuscitation. Christopher Henry states that he doesn't want to suffer and in his present situation with being as frail as he is he does not think efforts made to resuscitate him would  be of value. Christopher Henry heard and accepted this. I confirmed in conversation the plan for DNAR/DNI code status.   Patients fiance asked for help completing FMLA paperwork. I shared that I will aid in this through my colleagues in the Palliative care department.  Additional questions such as notarizing the HCPOA documents are being addressed. I reviewed that we can notarize in the hospital healthcare documents but nothing financial. Christopher Henry understood this. I recommended calling an outside notary to come in and complete this.   Objective Assessment: Vital Signs Vitals:   06/01/21 0757 06/01/21 1154  BP:    Pulse:  (!) (P) 108  Resp:  (P) 20  Temp:  (P) 97.6 F (36.4 C)  SpO2: 97%     Intake/Output Summary (Last 24 hours) at 06/01/2021 1231 Last data filed at 06/01/2021 0000 Gross per 24 hour  Intake 120 ml  Output --  Net 120 ml   Last Weight  Most recent update: 05/29/2021  2:43 AM    Weight  61.3 kg (135 lb 2.3 oz)            Gen:  Frail middle aged M in NAD HEENT: moist mucous membranes CV: Irregular rate PULM: On RA ABD: soft/nontender EXT: No edema Neuro: Alert and oriented x3  SUMMARY OF RECOMMENDATIONS   DNAR/DNI --> Confirmed with finace   HCPOA documents notarized and placed into the chart  Will help support getting FMLA paperwork   Reviewed the importance of a candid conversation with the oncology team to understand options. 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  Should this patient require assistance outside of these hours, please call the patient's attending physician.

## 2021-06-01 NOTE — Progress Notes (Signed)
This chaplain returned SW-Lisa's page for Pt. spiritual care.    The chaplain understands SW is communicating the Pt. girlfriend-Verna's interest in the process of a hospital marriage ceremony and prayer on Monday.    This chaplain will share with the spiritual care team and F/U on Monday.  Chaplain Sallyanne Kuster [phone]

## 2021-06-02 ENCOUNTER — Ambulatory Visit
Admit: 2021-06-02 | Discharge: 2021-06-02 | Disposition: A | Discharge status: Home / Self Care | Payer: 59 | Attending: Radiation Oncology | Admitting: Radiation Oncology

## 2021-06-02 DIAGNOSIS — K6289 Other specified diseases of anus and rectum: Secondary | ICD-10-CM | POA: Diagnosis not present

## 2021-06-02 DIAGNOSIS — C3490 Malignant neoplasm of unspecified part of unspecified bronchus or lung: Secondary | ICD-10-CM | POA: Diagnosis not present

## 2021-06-02 LAB — COMPREHENSIVE METABOLIC PANEL
ALT: 189 U/L — ABNORMAL HIGH (ref 0–44)
AST: 65 U/L — ABNORMAL HIGH (ref 15–41)
Albumin: 2.7 g/dL — ABNORMAL LOW (ref 3.5–5.0)
Alkaline Phosphatase: 171 U/L — ABNORMAL HIGH (ref 38–126)
Anion gap: 13 (ref 5–15)
BUN: 38 mg/dL — ABNORMAL HIGH (ref 6–20)
CO2: 19 mmol/L — ABNORMAL LOW (ref 22–32)
Calcium: 9 mg/dL (ref 8.9–10.3)
Chloride: 104 mmol/L (ref 98–111)
Creatinine, Ser: 1.78 mg/dL — ABNORMAL HIGH (ref 0.61–1.24)
GFR, Estimated: 46 mL/min — ABNORMAL LOW (ref >60)
Glucose, Bld: 127 mg/dL — ABNORMAL HIGH (ref 70–99)
Potassium: 5.4 mmol/L — ABNORMAL HIGH (ref 3.5–5.1)
Sodium: 136 mmol/L (ref 135–145)
Total Bilirubin: 1 mg/dL (ref 0.3–1.2)
Total Protein: 6.8 g/dL (ref 6.5–8.1)

## 2021-06-02 LAB — CBC
HCT: 27.4 % — ABNORMAL LOW (ref 39.0–52.0)
Hemoglobin: 8.7 g/dL — ABNORMAL LOW (ref 13.0–17.0)
MCH: 25.2 pg — ABNORMAL LOW (ref 26.0–34.0)
MCHC: 31.8 g/dL (ref 30.0–36.0)
MCV: 79.4 fL — ABNORMAL LOW (ref 80.0–100.0)
Platelets: 490 10*3/uL — ABNORMAL HIGH (ref 150–400)
RBC: 3.45 MIL/uL — ABNORMAL LOW (ref 4.22–5.81)
RDW: 20.1 % — ABNORMAL HIGH (ref 11.5–15.5)
WBC: 21.6 10*3/uL — ABNORMAL HIGH (ref 4.0–10.5)
nRBC: 0 % (ref 0.0–0.2)

## 2021-06-02 LAB — PHOSPHORUS: Phosphorus: 6.4 mg/dL — ABNORMAL HIGH (ref 2.5–4.6)

## 2021-06-02 LAB — POTASSIUM
Potassium: 5.9 mmol/L — ABNORMAL HIGH (ref 3.5–5.1)
Potassium: 5.2 mmol/L — ABNORMAL HIGH (ref 3.5–5.1)

## 2021-06-02 LAB — MAGNESIUM: Magnesium: 2.4 mg/dL (ref 1.7–2.4)

## 2021-06-02 MED ORDER — SODIUM ZIRCONIUM CYCLOSILICATE 10 G PO PACK
10.0000 g | PACK | Freq: Once | ORAL | Status: DC
  Filled 2021-06-02: qty 1

## 2021-06-02 NOTE — Progress Notes (Signed)
This chaplain phoned the Pt. fiance-Lisa per PMT NP-Kasie's referral.  [name] and the chaplain will F/U on Tuesday.  Chaplain Sallyanne Kuster [phone]

## 2021-06-02 NOTE — Progress Notes (Signed)
PROGRESS NOTE    Christopher Henry  LYY:[id number] DOB: 04/05/1971 DOA: 05/31/2021 PCP: Bary Castilla, NP   Brief Narrative:  This 50 y.o. male with PMH of COPD, HTN, alcohol abuse, tobacco use 1 pack/day x 35 years sent from pulmonary office to ED for acute respiratory distress hypoxic 80% on room air, nonproductive cough and hematemesis over the past 2 months, painless rectal bleeding, chronic internal hemorrhoids with prolapse, burning sensation on the chest and greater than 50 pound weight loss x12 months.  Upon presentation to the ED, hemoglobin 6.9 K, WBC 30 K, D-dimer 3.5, initial troponin 49 lactic acidosis 2.4 creatinine 1.5 chest x-ray RLL pneumonia. CTA chest no PE, infiltrative mass of the mediastinum, complete collapse of the right lower lobe with associated complete right lower lobe bronchi occlusion due to endobronchial debris/lesion,?  Mass extension into the carina, left to right midline shift due to decreased lung volume-concern for malignancy likely of the mediastinum with extension into or around the right mainstem bronchi leading to collapse of the right lower lobe, mediastinal lymphadenopathy, 1.7 cm groundglass opacity right apex, small to moderate volume pericardial effusion, subacute to chronic fracture of the medial sternal body.   05/03/2021 underwent bronchoscopy which showed large subcarinal mass indenting the carina and bulging bilaterally with large mainstem lesion obscuring 50% of lumen, biopsies taken. Large pericardial effusion noted on 2D echo, seen by cardiology and cardiothoracic surgery, S/p left anterior thoracotomy with drainage of pericardial effusion -open pericardial window by [name] 05/04/2021.  06/01/2021-underwent EGD/colonoscopy found to have prolapsed grade 4 rectal hemorrhoids.  Also started radiation therapy.  Post CT abdomen and pelvis with contrast for staging.  It revealed a large mass within the lower pelvis identified encasing the distal rectum and  herniating through the anus highly concerning for malignancy.  05/26/21-Had radiation therapy then was transfused 2 units PRBCs for hemoglobin of 7.4 K.   Hospital course complicated by intermittent rectal bleeding in the setting of internal hemorrhoids with prolapse and possible rectal mass.  Seen by general surgery, no plans for surgical intervention at this time.  MRI pelvis completed. Consider endoscopic ultrasound for biopsy to further define the nature of his pelvic mass in order to guide further treatment or goals of care decisions.   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 Patient presented with large subcarinal/mediastinal mass extending into the carina, Mediastinal lymphadenopathy with right upper lobe nodule, Right lower lobe atelectasis with endobronchial lesion: S/p bronchoscopy 10/15- mass potential to cause central airway obstruction. biopsy showed atypical cells. Rad Onc has seen and radiotherapy started  on 10/19 urgently.  Second radiation session on 05/26/2021.  Medical oncology input appreciated started initial staging with MRI of the brain with and without-that was normal, Biopsy of pericardium showed metastatic carcinoma. Palliative care consulted to discuss goals of care.  Conversation in process to discuss hospice care.  Malignant large pericardial effusion.: S/p open pericardial window by [name] 05/08/2021.   Drain removed on 05/22/21 and chest x-ray no pneumothorax.   Pericardial biopsy showed  metastatic carcinoma.  Continue colchicine. Pain is well controlled with current pain management.   Pelvic mass: Large mass within the lower pelvis identified encasing the distal rectum and herniated through the anus with concern for malignancy. MRI pelvis with and without contrast ordered on 05/27/2021. Medical oncology and general surgery following. Surgery recommended consider endoscopic ultrasound for biopsy to further define the nature of pelvic mass in order to guide further treatment or goals of care decisions.   Chronic blood loss anemia in the setting of chronic internal hemorrhoids. Transfused 2 units PRBCs on 05/26/2021 for hemoglobin of 7.4 K. Continue to monitor H&H.  Hemoglobin today morning 8.7   Chest discomfort: S/p pericardial window for malignant pericardial effusion Pain is improved with leaning forward. Continue colchicine. Continue as needed narcotic analgesics.   Leukocytosis suspect multifactorial.   Leukocytosis is downtrending, 18.5 from 30.6. Completed Rocephin for CAP X10 days, last day 05/26/2021. Remains afebrile. Continue to monitor.   Acute hypoxic respiratory failure > resolved. He was hypoxic 80% on room air with respiratory distress on admission 2/2 pneumonia/mediastinal mass.  Currently on room air with O2 saturation 96%.   Sepsis secondary to pneumonia /postobstructive pneumonia: In the setting of malignancy.  Completed antibiotics course.  Hematochezia/melena Acute blood loss anemia in the setting of painless rectal bleeding due to chronic large internal hemorrhoids with prolapse.   Anemia of chronic disease  S/P multiple PRBC transfusions.  Hb. Stable now. Transfuse if less than 7 g.   S/p EGD colonoscopy 10/18-mild esophagitis-positive for Candida esophagitis- large grade 4 prolapsed hemorrhoids-general surgery consulted and advised medical and topical care no plan for surgery at this time- w/ H prep, sitz bath (patient refusing bath).   Transfuse to keep hemoglobin above 8 g may need additional transfusion prior to discharge.   Candida esophagitis:  Continue fluconazole.   Dysphagia Had trouble swallowing his p.o. medications. Feeling of food getting stuck in his throat. Speech therapist consulted for swallow evaluation. Aspiration precautions in place.   Hyperkalemia: Lokelma x 1 given. Continue to monitor   AKI : Resolved, again creatinine up to 1.78 Continue iv gentle hydration.   COPD: Currently not in exacerbation. Continue Dulera,Incruse Ellipta and bronchodilators as needed   Severe protein calorie malnutrition with significant weight loss and BMI 16.45.   Severe muscle mass loss. Albumin 3.3 on 05/25/2021. In the setting of malignancy overall poor prognostic indicator. Appreciate dietitian's assistance. Continue Marinol Continue to encourage increase oral protein calorie intake.   History of alcohol use/tobacco use :  Continue thiamine,  folic acid multivitamins.   Deconditioning:  Evaluated by PT recommendation for home health PT. Continue PT OT with assistance    Nutrition Problem: Severe Malnutrition Etiology: acute illness (newly diagnosed lung cancer) Signs/Symptoms: severe muscle depletion, severe fat depletion, percent weight loss (6% weight loss in one month) Percent weight loss: 6 % Interventions: MVI, Magic cup, Ensure Enlive (each supplement provides 350kcal and 20 grams of protein)   Thrombocytosis, improving, suspect in the setting of acute illness. Platelet count downtrending 520 K from 740 K. Continue to monitor.  DVT prophylaxis: SCDs Code Status: Full code. Family Communication: No family at bed side. Disposition Plan:   Status is: Inpatient  Remains inpatient appropriate because: Pending work-up for malignancy. Needs goals of care discussion.    Anticipated discharge in 3 days.  Consultants:  Palliative care Oncology General surgery  Procedures:   Antimicrobials:   Anti-infectives (From admission, onward)    Start     Dose/Rate  Route Frequency Ordered Stop   05/25/21 1000  fluconazole (DIFLUCAN) tablet 200 mg       See Hyperspace for full Linked Orders Report.   200 mg Oral Daily 05/24/21 0834 06/07/21 0959   05/24/21 1000  fluconazole (DIFLUCAN) tablet 400 mg       See Hyperspace for full Linked Orders Report.   400 mg Oral  Once 05/24/21 0834 05/24/21 0913   05/09/2021 2145  cefTRIAXone (ROCEPHIN) 1 g in sodium chloride 0.9 % 100 mL IVPB        1 g 200 mL/hr over 30 Minutes Intravenous Every 24 hours 05/21/2021 2053 05/25/21 2212   05/07/2021 2145  azithromycin (ZITHROMAX) 500 mg in sodium chloride 0.9 % 250 mL IVPB        500 mg 250 mL/hr over 60 Minutes Intravenous Every 24 hours 05/13/2021 2053 05/20/2021 2322   05/03/2021 1545  ceFEPIme (MAXIPIME) 2 g in sodium chloride 0.9 % 100 mL IVPB        2 g 200 mL/hr over 30 Minutes Intravenous  Once 05/23/2021 1539 05/07/2021 1640        Subjective: Patient was seen and examined at bedside.  Overnight events noted.   Patient reports feeling bad, Pain is reasonably controlled. He appears chronically ill. He is not a candidate for systemic chemotherapy.  Objective: Vitals:   06/02/21 0813 06/02/21 0816 06/02/21 0835 06/02/21 1315  BP:   97/76 111/88  Pulse:   (!) 106 (!) 107  Resp:   20 20  Temp:   97.7 F (36.5 C) (!) 97.4 F (36.3 C)  TempSrc:   Oral Oral  SpO2: 100% 100% 98% 99%  Weight:      Height:        Intake/Output Summary (Last 24 hours) at 06/02/2021 1555 Last data filed at 06/02/2021 0100 Gross per 24 hour  Intake 120 ml  Output --  Net 120 ml   Filed Weights   05/25/2021 1948 05/29/21 0243  Weight: 61.3 kg 61.3 kg    Examination:  General exam: Appears comfortable, chronically ill looking.  Not in any distress. Respiratory system: Clear to auscultation. Respiratory effort normal.  RR 15 Cardiovascular system: S1-S2 heard, regular rate and rhythm, no  murmur. Gastrointestinal system: Abdomen is soft, nontender, nondistended, BS + Central nervous system: Alert and oriented X 3. No focal neurological deficits. Extremities: No edema, no cyanosis, no clubbing. Skin: No rashes, lesions or ulcers Psychiatry: Judgement and insight appear normal. Mood & affect appropriate.     Data Reviewed: I have personally reviewed following labs and imaging studies  CBC: Recent Labs  Lab 05/28/21 0146 05/29/21 0146 05/31/21 0048 06/01/21 0436 06/02/21 0128  WBC 22.2* 18.5* 20.6* 22.4* 21.6*  HGB 8.6* 8.6* 8.7* 8.9* 8.7*  HCT 26.7* 27.0* 27.9* 28.1* 27.4*  MCV 79.2* 80.1 81.3 80.5 79.4*  PLT 502* 490* 520* 487* 883*   Basic Metabolic Panel: Recent Labs  Lab 05/28/21 0146 05/29/21 0146 05/31/21 0048 06/01/21 0436 06/02/21 0128 06/02/21 0907  NA 138 138 137 137 136  --   K 4.8 4.1 4.3 5.1 5.4* 5.9*  CL 106 106 105 105 104  --   CO2 22 24 23  17* 19*  --   GLUCOSE 91 93 107* 105* 127*  --   BUN 33* 23* 22* 25* 38*  --   CREATININE 1.58* 1.26* 1.25* 1.35* 1.78*  --   CALCIUM 8.6* 8.7* 9.0 9.1 9.0  --   MG 2.2 2.3 2.2 2.2  2.4  --   PHOS 3.6 4.2 4.3 5.1* 6.4*  --    GFR: Estimated Creatinine Clearance: 43 mL/min (A) (by C-G formula based on SCr of 1.78 mg/dL (H)). Liver Function Tests: Recent Labs  Lab 05/28/21 0146 06/02/21 0128  AST 278* 65*  ALT 476* 189*  ALKPHOS 389* 171*  BILITOT 1.1 1.0  PROT 6.5 6.8  ALBUMIN 2.5* 2.7*   No results for input(s): LIPASE, AMYLASE in the last 168 hours. No results for input(s): AMMONIA in the last 168 hours. Coagulation Profile: No results for input(s): INR, PROTIME in the last 168 hours. Cardiac Enzymes: No results for input(s): CKTOTAL, CKMB, CKMBINDEX, TROPONINI in the last 168 hours. BNP (last 3 results) No results for input(s): PROBNP in the last 8760 hours. HbA1C: No results for input(s): HGBA1C in the last 72 hours. CBG: No results for input(s): GLUCAP in the last 168  hours. Lipid Profile: No results for input(s): CHOL, HDL, LDLCALC, TRIG, CHOLHDL, LDLDIRECT in the last 72 hours. Thyroid Function Tests: No results for input(s): TSH, T4TOTAL, FREET4, T3FREE, THYROIDAB in the last 72 hours. Anemia Panel: No results for input(s): VITAMINB12, FOLATE, FERRITIN, TIBC, IRON, RETICCTPCT in the last 72 hours. Sepsis Labs: No results for input(s): PROCALCITON, LATICACIDVEN in the last 168 hours.   No results found for this or any previous visit (from the past 240 hour(s)).   Radiology Studies: No results found.   Scheduled Meds:  (feeding supplement) PROSource Plus  30 mL Oral BID BM   colchicine  0.3 mg Oral Daily   dronabinol  2.5 mg Oral BID AC   feeding supplement  237 mL Oral QID   fluconazole  200 mg Oral Daily   folic acid  1 mg Oral Daily   hydrocortisone  25 mg Rectal BID   hydrocortisone-pramoxine  1 applicator Rectal BID   LORazepam  1 mg Intravenous Once   mometasone-formoterol  2 puff Inhalation BID   And   umeclidinium bromide  1 puff Inhalation Daily   multivitamin with minerals  1 tablet Oral Daily   pantoprazole sodium  40 mg Oral BID   sodium zirconium cyclosilicate  10 g Oral Once   thiamine  100 mg Oral Daily   Or   thiamine  100 mg Intravenous Daily   Continuous Infusions:  sodium chloride       LOS: 17 days    Time spent: 25 mins    Shawna Clamp, MD [facility]   If 7PM-7AM, please contact night-coverage

## 2021-06-02 NOTE — Progress Notes (Addendum)
HEMATOLOGY-ONCOLOGY PROGRESS NOTE  SUBJECTIVE: The patient was seen for routine medical oncology follow-up today.  His chart has been reviewed.  I saw the patient in his hospital room today and he was somewhat sedated.  He briefly opened his eyes, but did not really answer any questions for me.  His fiance was at the bedside.  He is having some chest discomfort today.  She tells me that he is taking in some fluids, but he has not really eating very well.  Overall, she recognizes that he is getting significantly weaker.  REVIEW OF SYSTEMS:   Unable to obtain  PHYSICAL EXAMINATION: ECOG PERFORMANCE STATUS: 3 - Symptomatic, >50% confined to bed  Vitals:   06/02/21 0835 06/02/21 1315  BP: 97/76 111/88  Pulse: (!) 106 (!) 107  Resp: 20 20  Temp: 97.7 F (36.5 C) (!) 97.4 F (36.3 C)  SpO2: 98% 99%   Filed Weights   05/08/2021 1948 05/29/21 0243  Weight: 61.3 kg 61.3 kg    Intake/Output from previous day: 10/30 0701 - 10/31 0700 In: 120 [P.O.:120] Out: -   GENERAL: Appears comfortable, cachectic SKIN: skin color, texture, turgor are normal, no rashes or significant lesions LUNGS: clear to auscultation and percussion with normal breathing effort HEART: regular rate & rhythm and no murmurs and no lower extremity edema ABDOMEN:abdomen soft, non-tender and normal bowel sounds NEURO: More somnolent, opens eyes but does not interact much  LABORATORY DATA:  I have reviewed the data as listed CMP Latest Ref Rng & Units 06/02/2021 06/02/2021 06/01/2021  Glucose 70 - 99 mg/dL - 127(H) 105(H)  BUN 6 - 20 mg/dL - 38(H) 25(H)  Creatinine 0.61 - 1.24 mg/dL - 1.78(H) 1.35(H)  Sodium 135 - 145 mmol/L - 136 137  Potassium 3.5 - 5.1 mmol/L 5.9(H) 5.4(H) 5.1  Chloride 98 - 111 mmol/L - 104 105  CO2 22 - 32 mmol/L - 19(L) 17(L)  Calcium 8.9 - 10.3 mg/dL - 9.0 9.1  Total Protein 6.5 - 8.1 g/dL - 6.8 -  Total Bilirubin 0.3 - 1.2 mg/dL - 1.0 -  Alkaline Phos 38 - 126 U/L - 171(H) -  AST 15 - 41  U/L - 65(H) -  ALT 0 - 44 U/L - 189(H) -    Lab Results  Component Value Date   WBC 21.6 (H) 06/02/2021   HGB 8.7 (L) 06/02/2021   HCT 27.4 (L) 06/02/2021   MCV 79.4 (L) 06/02/2021   PLT 490 (H) 06/02/2021   NEUTROABS 26.8 (H) 05/30/2021    DG Chest 1 View  Result Date: 05/22/2021 CLINICAL DATA:  Left chest tube removal EXAM: CHEST  1 VIEW COMPARISON:  05/05/2021 FINDINGS: Single frontal view of the chest demonstrates interval removal of the left-sided chest tube and mediastinal drain. Cardiac silhouette is mildly enlarged. Persistent adenopathy within the mediastinum and right hilum, with postobstructive changes in the right lower lobe again noted. Small right pleural effusion is again seen unchanged. There is a trace left apical pneumothorax volume estimated less than 5%. Minimal left basilar hypoventilatory changes. No acute bony abnormalities. IMPRESSION: 1. Trace left apical pneumothorax after left chest tube removal, volume estimated less than 5%. 2. Persistent right lower lobe consolidation concerning for underlying malignancy based on recent CT exam. 3. Persistent mediastinal and right hilar adenopathy. Critical Value/emergent results were called by telephone at the time of interpretation on 05/22/2021 at 3:40 pm to provider [facility] , who verbally acknowledged these results. Electronically Signed   By: Randa Ngo  M.D.   On: 05/22/2021 15:53   DG Chest 2 View  Result Date: 05/09/2021 CLINICAL DATA:  Shortness of breath EXAM: CHEST - 2 VIEW COMPARISON:  Radiograph 04/15/2021 FINDINGS: Unchanged cardiomediastinal silhouette. New right lower lobe airspace disease. No large pleural effusion or visible pneumothorax. There is prominence of the azygous vein and right paratracheal stripe. There is an unchanged compression deformity in midthoracic spine. No acute osseous abnormality. IMPRESSION: Right lower lobe pneumonia. Recommend follow-up chest radiograph in 6-12 weeks to ensure  resolution. Electronically Signed   By: Maurine Simmering M.D.   On: 05/12/2021 14:21   CT Angio Chest PE W and/or Wo Contrast  Result Date: 05/26/2021 CLINICAL DATA:  Pt here via EMS with c/o SOB with exertion. History of chronic bronchitis. Pt at PCP with SpO2 of 80. Placed on 2 liters with improvement of 100%. Chest tingling. Pt reports rectal bleeding X1 week EXAM: CT ANGIOGRAPHY CHEST WITH CONTRAST TECHNIQUE: Multidetector CT imaging of the chest was performed using the standard protocol during bolus administration of intravenous contrast. Multiplanar CT image reconstructions and MIPs were obtained to evaluate the vascular anatomy. CONTRAST:  17m OMNIPAQUE IOHEXOL 350 MG/ML SOLN COMPARISON:  Chest x-ray 05/26/2021, chest x-ray 04/15/2021 FINDINGS: Cardiovascular: Satisfactory opacification of the pulmonary arteries to the segmental level. No evidence of pulmonary embolism. The main pulmonary artery is normal in caliber. There is narrowing but no definite significant stenosis of the right main pulmonary artery due to external mass effect. Normal heart size. Small to moderate volume pericardial effusion. Left-to-right midline shift. Mediastinum/Nodes: Infiltrative mass of the mediastinum with markedly limited evaluation of its borders due to timing of contrast: Measures up to at least 5.8 cm on axial imaging (10:229). Mediastinal lymphadenopathy with as an example a 2.7 cm right paratracheal lymph node (2:58). No definite left hilar lymphadenopathy. No axillary nodes. Question extension of the mass into the carina (11:61). Thyroid gland demonstrate no significant findings. The esophagus is not well visualized due to abutment to the mass. Lungs/Pleura: Complete collapse of the right lower lobe with associated endobronchial debris/lesion. A 1.7 cm ground-glass airspace opacity at the right apex. Associated vague peripheral ground-glass airspace opacities within the right upper lobe. At least mild to moderate  paraseptal and centrilobular emphysematous changes. No focal consolidation, pulmonary nodule, mass of the left lung. No left pleural effusion. No right pleural effusion. No pneumothorax. Upper Abdomen: No acute abnormality. Musculoskeletal: Minimally displaced subacute to chronic fracture of the mid sternal body. Associated underlying cirrhosis may represent pathologic fracture versus healing. Otherwise no suspicious lytic or blastic osseous lesions. No acute displaced fracture. Multilevel degenerative changes of the spine. Slight vertebral body height loss anteriorly of the T9 vertebral body. Review of the MIP images confirms the above findings. IMPRESSION: 1. No pulmonary embolus; however, mild narrowing with no significant stenosis of the right main pulmonary artery due to external mass effect from the mass described below. 2. Infiltrative mass of the mediastinum with markedly limited evaluation of its borders due to timing of contrast. Associated complete collapse of the right lower lobe with associated complete right lower lobe bronchi occlusion due to endobronchial debris/lesion. Question extension of the mass into the carina.Associated left-to-right midline shift due to decreased lung volume. Concern for underlying malignancy - likely of the mediastinum with extent into or around the right mainstem bronchi leading to collapse the right lower lobe. Additional imaging evaluation or consultation with Pulmonology or Thoracic Surgery recommended. 3. Mediastinal lymphadenopathy. 4. A 1.7 cm ground-glass airspace opacity at  the right apex. Associated vague peripheral ground-glass airspace opacities within the right upper lobe. 5. Small to moderate volume pericardial effusion. 6. Subacute to chronic fracture of the mid sternal body. Underlying sclerosis likely due to healing; however, pathologic fracture is not fully excluded. 7. Emphysema (ICD10-J43.9). Electronically Signed   By: Iven Finn M.D.   On:  05/14/2021 18:12   MR BRAIN W WO CONTRAST  Result Date: 05/23/2021 CLINICAL DATA:  Non-small cell lung cancer.  Staging. EXAM: MRI HEAD WITHOUT AND WITH CONTRAST TECHNIQUE: Multiplanar, multiecho pulse sequences of the brain and surrounding structures were obtained without and with intravenous contrast. CONTRAST:  6.48m GADAVIST GADOBUTROL 1 MMOL/ML IV SOLN COMPARISON:  None. FINDINGS: Brain: The brain has a normal appearance without evidence of malformation, atrophy, old or acute small or large vessel infarction, mass lesion, hemorrhage, hydrocephalus or extra-axial collection. Few punctate foci of nonenhancing T2 signal in the frontal white matter, often seen in healthy individuals and not likely significant. Vascular: Major vessels at the base of the brain show flow. Venous sinuses appear patent. Skull and upper cervical spine: Normal. Sinuses/Orbits: Clear/normal. Other: None significant. IMPRESSION: Normal MRI of the brain.  No evidence of metastatic disease. Electronically Signed   By: MNelson ChimesM.D.   On: 05/23/2021 18:35   MR PELVIS W WO CONTRAST  Result Date: 05/30/2021 CLINICAL DATA:  A 50year old male presents for evaluation of anal/perianal mass. EXAM: MRI PELVIS WITHOUT AND WITH CONTRAST TECHNIQUE: Multiplanar multisequence MR imaging of the pelvis was performed both before and after administration of intravenous contrast. CONTRAST:  663mGADAVIST GADOBUTROL 1 MMOL/ML IV SOLN COMPARISON:  Comparison made with CT of the abdomen and pelvis from May 25, 2021. FINDINGS: Urinary Tract: Limited assessment of the urinary tract shows no gross thickening of the urinary bladder. No distal ureteral dilation. Bowel: There is an ulcerated, fungating lesion that extends from the inferior aspect of the sphincter complex into the perianal region. This measures 4.1 x 3.6 cm (image 43/6) Separate from this area is a horseshoe shaped abnormality that involves the sacrococcygeal region displacing the  levator plate and insinuating between slips of the pelvic floor musculature. Heterogeneous T2 signal is noted Signs of enhancement are suspected though subtraction images and true baseline images not performed. Definitive enhancement of the anal mass is noted. Peri rectal and pelvic floor/sacrococcygeal process measuring 6.8 x 6.5 cm in the axial plane anterior posterior extension is greatest on the RIGHT. Approximately 4.6 cm anterior-posterior extension on the LEFT. Areas of low T2 intermixed with intermediate to high T2 signal within the lesion. This appears to invade the distal rectum on sagittal image 38 where approximately 2.5 x 1.0 cm nodule along the posterior wall of the rectum is visible. The presence of extensive rectal gas limits assessment of luminal and mass relationships Vascular/Lymphatic: Vascular structures are not well assessed given protocol performed. Top-normal size LEFT common iliac lymph node at 9 mm (image 3/6) susceptibility artifact from a RIGHT hip arthroplasty limits assessment of pelvic structures. Reproductive: Prostate without gross abnormality. Limited assessment of reproductive structures is unremarkable. Other:  Trace fluid in the pelvis. Musculoskeletal: RIGHT hip arthroplasty changes. The no focal suspicious lesion or destructive process. 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 Horseshoe shaped complex lesion in the perirectal space involving the pelvic floor and likely invading the rectum. 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 Given location, unusual for metastases  and not contiguous with the perianal process would suggest endoscopic ultrasound with biopsy as warranted for further assessment/characterization. Top-normal size LEFT common iliac lymph node at 9 mm. Given findings on prior exams and the current study PET evaluation  may be helpful for further assessment to complete staging as well. Electronically Signed   By: Zetta Bills M.D.   On: 05/30/2021 09:47   CT ABDOMEN PELVIS W CONTRAST  Result Date: 05/25/2021 CLINICAL DATA:  Cancer staging.  Non-small cell lung cancer. EXAM: CT ABDOMEN AND PELVIS WITH CONTRAST TECHNIQUE: Multidetector CT imaging of the abdomen and pelvis was performed using the standard protocol following bolus administration of intravenous contrast. CONTRAST:  35m OMNIPAQUE IOHEXOL 350 MG/ML SOLN COMPARISON:  CT chest 05/03/2021. FINDINGS: Lower chest: Interval loculation of previously noted moderate pericardial effusion. 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 Complete atelectasis of the right lower lobe is again noted. New small right pleural effusion and small to moderate left effusion. Nodularity and interlobular septal thickening is identified within the imaged portions of the right middle lobe. Hepatobiliary: No suspicious liver abnormality. Left lobe of liver is either surgically absent or atrophic. Gallbladder unremarkable. Pancreas: Unremarkable. No pancreatic ductal dilatation or surrounding inflammatory changes. Spleen: Normal in size without focal abnormality. Adrenals/Urinary Tract: Normal left adrenal gland. Small left adrenal nodule measures 1.1 cm, image 40/6. No kidney mass or hydronephrosis. Bladder unremarkable. Stomach/Bowel: Stomach appears normal. The appendix is not confidently identified. No signs of bowel obstruction. Vascular/Lymphatic: Mild aortic atherosclerosis.  No aneurysm. Prominent left retroperitoneal lymph nodes are identified measuring up to 9 mm, image 35/3. No abdominopelvic adenopathy however Reproductive: Prostate is unremarkable. Other: Signs of peritoneal disease identified. There is a enhancing soft tissue nodule along the left pericolic gutter which measures 1.6 cm, image 43/2. Left upper quadrant peritoneal nodule anterior to the spleen measures 0.9 cm, image  17/3. There is a large mass within the low pelvis, posterior to the prostate measuring 8.5 x 6.1 by 8.4 cm, image 50/6 and image 76/3. This encases the distal rectum and appears to herniate through the anus. There are cystic and solid components associated with this mass. No significant ascites identified within the abdomen or pelvis. Musculoskeletal: Previous ORIF of the right femur. Bones appear diffusely osteopenic. No definite signs of osseous metastasis. IMPRESSION: 1. Large mass within the low pelvis is identified encasing the distal rectum and herniating through the anus. This should be visible upon physical exam. This is highly concerning for malignancy and may be an unusual presentation of peritoneal disease or reflect primary rectal neoplasm. Correlation with physical exam findings. When the patient is clinically stable, able to follow instructions, and remain motionless a nonemergent, contrast enhanced pelvic MRI may provide more definitive evaluation of this mass. Alternatively, a PET-CT may provide a more thorough and whole body assessment none of tumor burden for staging purposes. 2. Signs of peritoneal disease are identified including left pericolic gutter and left upper quadrant peritoneal nodules. 3. Interval loculation of previously noted moderate pericardial effusion. 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 4. New small right pleural effusion and small to moderate left effusion. Nodularity and interlobular septal thickening within the imaged portions of the right middle lobe concerning for lymphangitic spread of disease. 5. Small left adrenal nodule is identified. Nonspecific. Cannot exclude metastatic disease. 6. Aortic Atherosclerosis (ICD10-I70.0). Electronically Signed   By: TKerby MoorsM.D.   On: 05/25/2021 05:50   DG CHEST PORT 1 VIEW  Result Date: 05/27/2021 CLINICAL DATA:  Pericardial effusion. EXAM: PORTABLE CHEST 1  VIEW COMPARISON:  May 19, 2021 FINDINGS: The  cardiomediastinal silhouette is unchanged and enlarged in contour.Pericardial drain. LEFT-sided chest tube. Small RIGHT pleural effusion, similar in comparison to prior. No significant pneumothorax. Persistent interstitial prominence and haziness of the basilar vasculature most consistent with pulmonary edema and scattered atelectasis. Visualized abdomen is unremarkable. LEFT-sided subcutaneous air. IMPRESSION: Pericardial drain and LEFT-sided chest tube without significant pneumothorax. Persistent small RIGHT pleural effusion. Electronically Signed   By: Valentino Saxon M.D.   On: 05/24/2021 08:16   DG CHEST PORT 1 VIEW  Result Date: 05/30/2021 CLINICAL DATA:  Pericardial effusion EXAM: PORTABLE CHEST 1 VIEW COMPARISON:  Chest x-ray dated May 16, 2021 FINDINGS: Cardiac and mediastinal contours are unchanged. Unchanged narrowing of the right mainstem bronchus, likely due to known mediastinal adenopathy. New mediastinal drain and left-sided chest tube. Small right pleural effusion and atelectasis. No pneumothorax. IMPRESSION: Interval placement of mediastinal drain and left-sided chest tube. No evidence of pneumothorax. Small right pleural effusion and atelectasis. Electronically Signed   By: Yetta Glassman M.D.   On: 05/05/2021 15:52   ECHOCARDIOGRAM COMPLETE  Result Date: 05/10/2021    ECHOCARDIOGRAM REPORT   Patient Name:   Christopher Henry Date of Exam: 05/21/2021 Medical Rec #:  [id number]    Height:       76.0 in Accession #:    [id number]   Weight:       135.1 lb Date of Birth:  09/22/1970     BSA:          1.875 m Patient Age:    15 years     BP:           108/78 mmHg Patient Gender: M            HR:           107 bpm. Exam Location:  Inpatient Procedure: 2D Echo and 3D Echo Indications:    Pericardial effusion  History:        Patient has no prior history of Echocardiogram examinations.                 COPD; Risk Factors:Current Smoker. ETOH abuse.  Sonographer:    Clayton Lefort RDCS (AE) Referring  Phys: [phone] RAKESH V ALVA  Sonographer Comments: Suboptimal subcostal window. IMPRESSIONS  1. Left ventricular ejection fraction, by estimation, is 60 to 65%. The left ventricle has normal function. The left ventricle has no regional wall motion abnormalities. Left ventricular diastolic parameters were normal.  2. Right ventricular systolic function is normal. The right ventricular size is normal.  3. Large circumferential pericardial effusion largest in posterior and lateral aspects of LV IVC not well seen no respiratory variation in mitral inflow and only RA diastolic collapse not RV. Given likely diagnosis of metastatic lung cancer consider CVTS consult for pericardial window and for both Rx and diagnostic reasons . Large pericardial effusion. The pericardial effusion is circumferential.  4. The mitral valve is normal in structure. Trivial mitral valve regurgitation. No evidence of mitral stenosis.  5. The aortic valve is tricuspid. Aortic valve regurgitation is not visualized. Mild to moderate aortic valve sclerosis/calcification is present, without any evidence of aortic stenosis.  6. The inferior vena cava is normal in size with greater than 50% respiratory variability, suggesting right atrial pressure of 3 mmHg. FINDINGS  Left Ventricle: Left ventricular ejection fraction, by estimation, is 60 to 65%. The left ventricle has normal function. The left ventricle has no regional wall motion abnormalities. The left ventricular  internal cavity size was normal in size. There is  no left ventricular hypertrophy. Left ventricular diastolic parameters were normal. Right Ventricle: The right ventricular size is normal. No increase in right ventricular wall thickness. Right ventricular systolic function is normal. Left Atrium: Left atrial size was normal in size. Right Atrium: Right atrial size was normal in size. Pericardium: Large circumferential pericardial effusion largest in posterior and lateral aspects of LV IVC  not well seen no respiratory variation in mitral inflow and only RA diastolic collapse not RV. Given likely diagnosis of metastatic lung cancer consider CVTS consult for pericardial window and for both Rx and diagnostic reasons. A large pericardial effusion is present. The pericardial effusion is circumferential. Mitral Valve: The mitral valve is normal in structure. There is mild thickening of the mitral valve leaflet(s). There is mild calcification of the mitral valve leaflet(s). Trivial mitral valve regurgitation. No evidence of mitral valve stenosis. Tricuspid Valve: The tricuspid valve is normal in structure. Tricuspid valve regurgitation is not demonstrated. No evidence of tricuspid stenosis. Aortic Valve: The aortic valve is tricuspid. Aortic valve regurgitation is not visualized. Mild to moderate aortic valve sclerosis/calcification is present, without any evidence of aortic stenosis. Aortic valve mean gradient measures 4.0 mmHg. Aortic valve peak gradient measures 5.9 mmHg. Aortic valve area, by VTI measures 2.93 cm. Pulmonic Valve: The pulmonic valve was normal in structure. Pulmonic valve regurgitation is not visualized. No evidence of pulmonic stenosis. Aorta: The aortic root is normal in size and structure. Venous: The inferior vena cava is normal in size with greater than 50% respiratory variability, suggesting right atrial pressure of 3 mmHg. IAS/Shunts: No atrial level shunt detected by color flow Doppler.  LEFT VENTRICLE PLAX 2D LVIDd:         4.10 cm   Diastology LVIDs:         2.60 cm   LV e' medial:    8.55 cm/s LV PW:         1.00 cm   LV E/e' medial:  10.2 LV IVS:        0.90 cm   LV e' lateral:   8.78 cm/s LVOT diam:     2.20 cm   LV E/e' lateral: 10.0 LV SV:         44 LV SV Index:   23 LVOT Area:     3.80 cm  RIGHT VENTRICLE RV Basal diam:  2.70 cm RV S prime:     25.50 cm/s TAPSE (M-mode): 2.8 cm LEFT ATRIUM             Index        RIGHT ATRIUM           Index LA diam:        3.00 cm  1.60 cm/m   RA Area:     13.20 cm LA Vol (A2C):   21.3 ml 11.36 ml/m  RA Volume:   32.30 ml  17.22 ml/m LA Vol (A4C):   51.5 ml 27.46 ml/m LA Biplane Vol: 33.5 ml 17.86 ml/m  AORTIC VALVE AV Area (Vmax):    2.87 cm AV Area (Vmean):   2.66 cm AV Area (VTI):     2.93 cm AV Vmax:           121.00 cm/s AV Vmean:          93.200 cm/s AV VTI:            0.149 m AV Peak Grad:  5.9 mmHg AV Mean Grad:      4.0 mmHg LVOT Vmax:         91.30 cm/s LVOT Vmean:        65.100 cm/s LVOT VTI:          0.115 m LVOT/AV VTI ratio: 0.77  AORTA Ao Root diam: 2.90 cm Ao Asc diam:  3.10 cm MITRAL VALVE               TRICUSPID VALVE MV Area (PHT): 5.34 cm    TR Peak grad:   21.5 mmHg MV Decel Time: 142 msec    TR Vmax:        232.00 cm/s MV E velocity: 87.50 cm/s MV A velocity: 78.30 cm/s  SHUNTS MV E/A ratio:  1.12        Systemic VTI:  0.12 m                            Systemic Diam: 2.20 cm Jenkins Rouge MD Electronically signed by Jenkins Rouge MD Signature Date/Time: 05/18/2021/3:57:03 PM    Final     ASSESSMENT AND PLAN: This is a 50 year old male with   1) metastatic non-small cell lung cancer -CTA chest performed 05/10/2021- "1. No pulmonary embolus; however, mild narrowing with no significant stenosis of the right main pulmonary artery due to external mass effect from the mass described below.2. Infiltrative mass of the mediastinum with markedly limited evaluation of its borders due to timing of contrast. Associated complete collapse of the right lower lobe with associated complete right lower lobe bronchi occlusion due to endobronchial debris/lesion. Question extension of the mass into the carina.Associated left-to-right midline shift due to decreased lung volume. Concern for underlying malignancy - likely of the mediastinum with extent into or around the right mainstem bronchi leading to collapse the right lower lobe. Additional imaging evaluation or consultation with Pulmonology or Thoracic Surgery  recommended. 3. Mediastinal lymphadenopathy. 4. A 1.7 cm ground-glass airspace opacity at the right apex. Associated vague peripheral ground-glass airspace opacities within the right upper lobe. 5. Small to moderate volume pericardial effusion. 6. Subacute to chronic fracture of the mid sternal body. Underlying sclerosis likely due to healing; however, pathologic fracture is not fully excluded. 7. Emphysema (ICD10-J43.9)."   2) malignant pericardial effusion   3) microcytic anemia -Labs from 05/18/2021 showed a ferritin of 74, iron 10, TIBC 280, percent saturation 4%   4) leukocytosis   5) thrombocytosis   6) protein calorie malnutrition   7) alcohol use/recent tobacco use  8) pelvic mass -CT abdomen/pelvis with contrast 05/25/2021- "1. Large mass within the low pelvis is identified encasing the distal rectum and herniating through the anus. This should be visible upon  physical exam. This is highly concerning for malignancy and may be an unusual presentation of peritoneal disease or reflect primary rectal neoplasm. Correlation with physical exam findings. When the patient is clinically stable, able to follow instructions, and remain motionless a nonemergent, contrast enhanced pelvic MRI may provide more definitive evaluation of this mass. Alternatively, a PET-CT may provide a more thorough and whole body assessment none of tumor burden for staging purposes. 2. Signs of peritoneal disease are identified including left pericolic gutter and left upper quadrant peritoneal nodules. 3. Interval loculation of previously noted moderate pericardial effusion. 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 4. New small right pleural effusion and small to moderate left effusion. Nodularity and interlobular septal thickening within the imaged portions of the  right middle lobe concerning for lymphangitic spread of disease. 5. Small left adrenal nodule is identified. Nonspecific. Cannot exclude metastatic disease. 6.  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 Horseshoe shaped complex lesion in the perirectal space involving the pelvic floor and likely invading the rectum. 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 Given location, unusual for metastases and not contiguous with the perianal process would suggest endoscopic ultrasound with biopsy as warranted for further  assessment/characterization. Top-normal size LEFT common iliac lymph node at 9 mm. Given findings on prior exams and the current study PET evaluation may be helpful for further assessment to complete staging as well."  PLAN: -Discussed the patient's diagnosis of metastatic non-small cell lung cancer with the patient's fianc at the bedside.  We discussed that we have molecular studies and PD-L1 studies pending.  These will not be back until about 11/8.  We briefly discussed that treatment would likely consist of some form of systemic chemotherapy +/- immunotherapy.  We further discussed that given his continued decline in performance status, he would not be a candidate for systemic therapy.  His fiance has recognized his continued decline and agrees with this assessment. -He is currently undergoing radiation under the care of Dr. Lisbeth Renshaw to his lung mass. -We further discussed that he has a pelvic mass and unclear if this is related to his lung malignancy versus a second malignancy.  Endoscopic ultrasound would be recommended for further evaluation of this pelvic mass.  However, given continued decline in performance status, I do not know how well he would tolerate additional work-up. -We also started discussions regarding hospice.  She is planning to meet with the palliative care team for ongoing discussion.  Reasonable to consider  discharge home with hospice as he would not be a candidate for systemic treatment at this time. -The patient's fianc is making plans for a wedding this Thursday.  She is in the process of obtaining the appropriate paperwork to make this happen.  Future Appointments  Date Time Provider [place]  06/03/2021  1:00 PM [facility] LINAC 1 [facility] None  06/04/2021 11:30 AM [facility] LINAC 1 [facility] None  06/04/2021  3:00 PM Daryel November, MD [facility]  06/05/2021  1:45 PM [facility] LINAC 1 [facility] None  Jun 29, 2021 11:30 AM [facility] LINAC 1 [facility] None  06/09/2021  1:30 PM [facility] LINAC 1 [facility] None  06/10/2021  1:15 PM [facility] LINAC 1 [facility] None  06/11/2021  1:00 PM [facility] LAB [facility] None  06/11/2021  1:15 PM [facility] LINAC 1 [facility] None  06/11/2021  1:40 PM Brunetta Genera, MD [facility] None      LOS: 17 days   Mikey Bussing, DNP, AGPCNP-BC, AOCNP 06/02/21   ADDENDUM  .Patient was Personally and independently interviewed, examined and relevant elements of the history of present illness were reviewed in details and an assessment and plan was created. All elements of the patient's history of present illness , assessment and plan were discussed in details with Mikey Bussing, DNP, AGPCNP-BC, AOCNP. The above documentation reflects our combined findings assessment and plan.   Patient's functional status is significantly deteriorated since her last clinic visit.  He appears to be much more debilitated with poor p.o. intake and significant fatigue likely from radiation.  He has also been noted to have a new perianal lesion concerning for squamous  cell skin cancer versus anal cancer.  This has not been confirmed with a biopsy at this time.  He is very fatigued and does not have a good sense of what he would like to do.  I had a detailed goals of care discussion with the patient and we tried to call his fiance Christopher Henry  from my phone as well as his without success.  I subsequently called Christopher Henry about 4 times as well. At this time the patient is nearly bedbound with a ECOG performance status of 3-4 and is not a good candidate for palliative chemotherapy for his metastatic lung cancer.  Would recommend palliative care consultation for continued goals of care discussion.  Depending on goals of care will need additional work-up including biopsy of his perianal mass.  It certainly would be reasonable to consider best supportive cares through hospice given the patient's worsening performance status likely multiple cancers needing significant involved treatment which appears to be difficult for the patient to handle. All of these treatments would be typically done as outpatient if his performance status were to significantly improve.  Depending on goals of care can set up outpatient oncology follow-up if patient is desirous of additional palliative treatments. He is still pending PD-L1 and foundation 1 results from his lung cancer biopsy.   Sullivan Lone MD MS

## 2021-06-02 NOTE — Progress Notes (Signed)
   06/02/21 0932  Clinical Encounter Type  Visited With Other (Comment) Marya Amsler - CSW called)  Visit Type Other (Comment)  Referral From Social work  Consult/Referral To Chaplain   CSW Marya Amsler called, stating the patient is requesting to get married. I advised him that Raymond (Palliative Care) is aware of the patient's request and will follow up with the patient today.  This note was prepared by Jeanine Luz, M.Div..  For questions please contact by phone [phone].

## 2021-06-02 NOTE — Progress Notes (Signed)
PT Cancellation Note  Patient Details Name: Padraig Nhan MRN: [id number] DOB: [birth year]   Cancelled Treatment:    Reason Eval/Treat Not Completed: Other (comment). Pt reports his head is dizzy and he doesn't want to get up. Will continue to follow/monitor.    Shary Decamp [place] 06/02/2021, 6:07 PM [place] Pager [phone] Office [phone]

## 2021-06-02 NOTE — TOC Progression Note (Addendum)
Transition of Care [facility]) - Progression Note    Patient Details  Name: Leona Pressly MRN: [id number] Date of Birth: 03-24-1971  Transition of Care [facility]) CM/SW Contact  Joanne Chars, LCSW Phone Number: 06/02/2021, 9:31 AM  Clinical Narrative:   CSW spoke with Baker Janus in [facility], she was not aware of wedding request but will meet with pt today.    Call back from [place].  Sallyanne Kuster in spiritual care is working on this and will make contact with pt.     Expected Discharge Plan:  (TBD) Barriers to Discharge: Continued Medical Work up  Expected Discharge Plan and Services Expected Discharge Plan:  (TBD)   Discharge Planning Services: CM Consult   Living arrangements for the past 2 months: Single Family Home                 DME Arranged: 3-N-1, Walker rolling, Wheelchair manual DME Agency: [employer] Date DME Agency Contacted: 05/26/21 Time DME Agency Contacted: [phone] Representative spoke with at DME Agency: [place] ([place]) Interventions    Readmission Risk Interventions No flowsheet data found.

## 2021-06-02 NOTE — Progress Notes (Signed)
OT Cancellation Note  Patient Details Name: Christopher Henry MRN: [id number] DOB: 03/07/1971   Cancelled Treatment:    Reason Eval/Treat Not Completed: Patient at procedure or test/ unavailable Lodema Hong, [place]  Pager [phone] Office [phone]  Trixie Dredge 06/02/2021, 10:49 AM

## 2021-06-02 NOTE — Progress Notes (Signed)
This chaplain responded to PMT-MYF  and SW-Greg referral for assisting the Pt. and fiancee-Lisa in a hospital wedding ceremony.  The chaplain contacted [place] before visiting with [name] at the bedside.  The chaplain understands from the Pt. RN-Natalie, the Pt. is at [facility] for radiation and the following notes were not discussed with the Pt.  The chaplain understands the following talking points are in place for a possible wedding on Thursday afternoon.  *[name] is pursuing the marriage license from [facility]. The marriage license will need to be in hand before clergy/chaplain can marry the couple.  [name] may choose her own clergy to perform the wedding. *Visitor restrictions will need to be discussed with the RN to accommodate Lisa's choice of clergy and two witnesses. *[name] is hopeful the wedding ceremony will be in the hospital chapel. [name] verbalized her understanding of the need for the  medical team's permission and coordination.  The chaplain offered reflective listening in a  space for [name] to share and receive prayer.  The chaplain understands [name] will ask the RN to page the chaplain as needed.    Chaplain Sallyanne Kuster [phone]

## 2021-06-03 ENCOUNTER — Ambulatory Visit
Admission: RE | Admit: 2021-06-03 | Discharge: 2021-06-03 | Disposition: A | Discharge status: Home / Self Care | Payer: 59 | Source: Ambulatory Visit | Attending: Radiation Oncology | Admitting: Radiation Oncology

## 2021-06-03 ENCOUNTER — Inpatient Hospital Stay (HOSPITAL_COMMUNITY): Payer: 59

## 2021-06-03 DIAGNOSIS — K6289 Other specified diseases of anus and rectum: Secondary | ICD-10-CM

## 2021-06-03 LAB — BASIC METABOLIC PANEL
Anion gap: 13 (ref 5–15)
BUN: 55 mg/dL — ABNORMAL HIGH (ref 6–20)
CO2: 18 mmol/L — ABNORMAL LOW (ref 22–32)
Calcium: 9 mg/dL (ref 8.9–10.3)
Chloride: 106 mmol/L (ref 98–111)
Creatinine, Ser: 2.16 mg/dL — ABNORMAL HIGH (ref 0.61–1.24)
GFR, Estimated: 36 mL/min — ABNORMAL LOW (ref >60)
Glucose, Bld: 122 mg/dL — ABNORMAL HIGH (ref 70–99)
Potassium: 5.6 mmol/L — ABNORMAL HIGH (ref 3.5–5.1)
Sodium: 137 mmol/L (ref 135–145)

## 2021-06-03 LAB — CBC
HCT: 25 % — ABNORMAL LOW (ref 39.0–52.0)
Hemoglobin: 7.9 g/dL — ABNORMAL LOW (ref 13.0–17.0)
MCH: 25.2 pg — ABNORMAL LOW (ref 26.0–34.0)
MCHC: 31.6 g/dL (ref 30.0–36.0)
MCV: 79.9 fL — ABNORMAL LOW (ref 80.0–100.0)
Platelets: 421 10*3/uL — ABNORMAL HIGH (ref 150–400)
RBC: 3.13 MIL/uL — ABNORMAL LOW (ref 4.22–5.81)
RDW: 20.4 % — ABNORMAL HIGH (ref 11.5–15.5)
WBC: 20 10*3/uL — ABNORMAL HIGH (ref 4.0–10.5)
nRBC: 0.1 % (ref 0.0–0.2)

## 2021-06-03 LAB — MAGNESIUM: Magnesium: 2.5 mg/dL — ABNORMAL HIGH (ref 1.7–2.4)

## 2021-06-03 LAB — URIC ACID: Uric Acid, Serum: 11.7 mg/dL — ABNORMAL HIGH (ref 3.7–8.6)

## 2021-06-03 LAB — PHOSPHORUS: Phosphorus: 7.3 mg/dL — ABNORMAL HIGH (ref 2.5–4.6)

## 2021-06-03 MED ORDER — SODIUM CHLORIDE 0.9 % IV BOLUS
500.0000 mL | Freq: Once | INTRAVENOUS | Status: AC
  Administered 2021-06-03: 500 mL via INTRAVENOUS

## 2021-06-03 MED ORDER — SODIUM BICARBONATE 650 MG PO TABS
1300.0000 mg | ORAL_TABLET | Freq: Two times a day (BID) | ORAL | Status: DC
  Administered 2021-06-03 (×2): 1300 mg via ORAL
  Filled 2021-06-03 (×3): qty 2

## 2021-06-03 MED ORDER — SODIUM ZIRCONIUM CYCLOSILICATE 10 G PO PACK
10.0000 g | PACK | Freq: Once | ORAL | Status: AC
  Administered 2021-06-03: 10 g via ORAL

## 2021-06-03 MED ORDER — SODIUM CHLORIDE 0.9 % IV SOLN: INTRAVENOUS | Status: AC

## 2021-06-03 NOTE — Progress Notes (Signed)
Called [facility] to check policy for wedding. Joy will be away and I have sent her an email. Payton Emerald, RN

## 2021-06-03 NOTE — Progress Notes (Signed)
PT Cancellation Note  Patient Details Name: Christopher Henry MRN: [id number] DOB: 1971-02-22   Cancelled Treatment:    Reason Eval/Treat Not Completed: Fatigue/lethargy limiting ability to participate; pt endorses fatigue post-OT session, not interested in working with PT today. Will follow-up for PT treatment session tomorrow; will plan to discuss goals of care with patient regarding continuing acute PT services.  Mabeline Caras, PT, DPT Acute Rehabilitation Services  Pager [phone] Office [place] 06/03/2021, 11:30 AM

## 2021-06-03 NOTE — Progress Notes (Signed)
This chaplain followed up by phone with the Pt. Fiance-Lisa. The chaplain is hopeful she will join PMT visit at 11am with both the Pt. and [name] to discuss the proposed wedding.   The chaplain updated the Pt. RN-Sandra. The RN referred the conversation about unit policy/process to [employer].  The chaplain will F/U with Joy on Wednesday morning.    Chaplain Sallyanne Kuster [phone]

## 2021-06-03 NOTE — Progress Notes (Signed)
Daily Progress Note   Patient Name: Christopher Henry       Date: 06/03/2021 DOB: [birth year]  Age: 50 y.o. MRN#: [id number] Attending Physician: Shawna Clamp, MD Primary Care Physician: Bary Castilla, NP Admit Date: 05/13/2021  Reason for Consultation/Follow-up: Establishing goals of care and Psychosocial/spiritual support Reviewed medical records, received report from team and assessed the patient.  Todays discussion  This nurse practitioner met at the bedside along with the patient's significant other Lenox Ahr for [employer] education regarding current medical situation; diagnosis, prognosis, goals of care, end-of-life wishes, disposition and options.  Patient and his significant other verbalized an understanding of his diagnosis of metastatic non-small cell lung cancer.  Complete molecular studies are pending.  His treatment options are somewhat limited at this point in time secondary to his continued physical and functional decline.  Both understand he has a pelvic mass, unsure the source, also possible rectal cancer.  He is currently undergoing radiation under the care of Dr. Marga Melnick oncology for his lung mass.  A detailed discussion was had today regarding advanced directives, concept specific to artificial feeding and hydration, IV antibiotics,  projected hospitalizations, oncologic agents,   Education offered on the difference between aggressive medical intervention path and a palliative comfort path for this patient at this time in this situation.  Education offered on hospice philosophy and hospice benefit fit both in the home and residential.  Patient and his significant other/ HPOA/ Dolores Lory "[name]" [place] face treatment option decisions, advanced directive decisions  and anticipatory care needs.  Patient is unable to commit to any decisions regarding treatment plan.  He does verbalize that he feels "safe" here in the hospital .  Will take it one day at a time, with continued support as patient "figures out what to do"  Length of Stay: 18  Current Medications: Scheduled Meds:   (feeding supplement) PROSource Plus  30 mL Oral BID BM   colchicine  0.3 mg Oral Daily   dronabinol  2.5 mg Oral BID AC   feeding supplement  237 mL Oral QID   fluconazole  200 mg Oral Daily   folic acid  1 mg Oral Daily   hydrocortisone  25 mg Rectal BID   hydrocortisone-pramoxine  1 applicator Rectal BID   LORazepam  1 mg Intravenous Once   mometasone-formoterol  2  puff Inhalation BID   And   umeclidinium bromide  1 puff Inhalation Daily   multivitamin with minerals  1 tablet Oral Daily   pantoprazole sodium  40 mg Oral BID   sodium bicarbonate  1,300 mg Oral BID   sodium zirconium cyclosilicate  10 g Oral Once   sodium zirconium cyclosilicate  10 g Oral Once   thiamine  100 mg Oral Daily   Or   thiamine  100 mg Intravenous Daily    Continuous Infusions:  sodium chloride      PRN Meds: acetaminophen (TYLENOL) oral liquid 160 mg/5 mL, albuterol, docusate sodium, melatonin, morphine injection, polyethylene glycol, traMADol, witch hazel-glycerin  Physical Exam Constitutional:      Appearance: He is cachectic. He is ill-appearing.  Cardiovascular:     Rate and Rhythm: Normal rate.  Pulmonary:     Effort: Pulmonary effort is normal.  Abdominal:     Tenderness: There is abdominal tenderness.  Musculoskeletal:     Comments: Generalized weakness and muscle atrophy  Skin:    General: Skin is warm and dry.  Neurological:     Mental Status: He is lethargic.            Vital Signs: BP 94/61 (BP Location: Left Arm)   Pulse 100   Temp (!) 97.5 F (36.4 C) (Oral)   Resp 20   Ht 6' 4" (1.93 m)   Wt 61.3 kg   SpO2 100%   BMI 16.45 kg/m  SpO2: SpO2: 100  % O2 Device: O2 Device: Room Air O2 Flow Rate: O2 Flow Rate (L/min): 97 L/min  Intake/output summary:  Intake/Output Summary (Last 24 hours) at 06/03/2021 0912 Last data filed at 06/02/2021 1849 Gross per 24 hour  Intake 236 ml  Output --  Net 236 ml   LBM: Last BM Date: 05/31/21 Baseline Weight: Weight: 61.3 kg Most recent weight: Weight: 61.3 kg       Palliative Assessment/Data: 30 % at best      Patient Active Problem List   Diagnosis Date Noted   Non-small cell lung cancer with metastasis ([place]) 05/23/2021   Protein-calorie malnutrition, severe 05/22/2021   Malignant pericardial effusion    Acute blood loss anemia    Acute hypoxemic respiratory failure ([place]) 05/24/2021   COPD with acute exacerbation ([place]) 05/27/2021   History of hemoptysis 05/08/2021   Symptomatic anemia 05/28/2021   Mediastinal mass 05/15/2021   AKI (acute kidney injury) ([place]) 05/07/2021   Gastrointestinal hemorrhage 05/25/2021   Sepsis due to pneumonia ([place]) 05/15/2021   COPD (chronic obstructive pulmonary disease) ([place]) 05/12/2021   Anxiety state, unspecified 11/09/2012   Internal hemorrhoids with prolapse 11/09/2012    Palliative Care Assessment & Plan    Assessment: Patient continues to decline physically and functionally.  He is generally uncomfortable most of the time.  Prognosis is poor.  Recommendations/Plan: Continue conversation regarding patient's treatment plan, emotional support Ativan 1 mg p.o. 3 times daily as needed for anxiety   Code Status:    Code Status Orders  (From admission, onward)           Start     Ordered   06/01/21 1440  Do not attempt resuscitation (DNR)  Continuous       Question Answer Comment  In the event of cardiac or respiratory ARREST Do not call a "code blue"   In the event of cardiac or respiratory ARREST Do not perform Intubation, CPR, defibrillation or ACLS   In the event of  cardiac or respiratory ARREST Use medication by any route,  position, wound care, and other measures to relive pain and suffering. May use oxygen, suction and manual treatment of airway obstruction as needed for comfort.      06/01/21 1439           Code Status History     Date Active Date Inactive Code Status Order ID Comments User Context   05/28/2021 2020 06/01/2021 1439 Full Code [id number]  Etta Quill, DO Inpatient   05/28/2021 0951 05/28/2021 2020 DNR [id number]  Rosezella Rumpf, NP Inpatient   05/22/2021 2106 05/28/2021 0950 Full Code [id number]  Orma Flaming, MD Inpatient          Discharge Planning: To Be Determined Education offered on hospice benefit both in the home and residential hospice.  Education offered on the natural trajectory and expectations at EOL  Care plan was discussed with Dr Cathlean Sauer   Thank you for allowing the Palliative Medicine Team to assist in the care of this patient.   Time In: 0800 Time Out: 0930 Total Time 90 minutes Prolonged Time Billed  yes       Greater than 50%  of this time was spent counseling and coordinating care related to the above assessment and plan.  Wadie Lessen, NP  Please contact Palliative Medicine Team phone at [phone] for questions and concerns.

## 2021-06-03 NOTE — Progress Notes (Addendum)
PROGRESS NOTE    Christopher Henry  PZW:[id number] DOB: 01-16-1971 DOA: 05/30/2021 PCP: Bary Castilla, NP   Brief Narrative:  This 50 y.o. male with PMH of COPD, HTN, alcohol abuse, tobacco use 1 pack/day x 35 years sent from pulmonary office to ED for acute respiratory distress, hypoxic 80% on room air, nonproductive cough and hematemesis over the past 2 months, painless rectal bleeding, chronic internal hemorrhoids with prolapse, burning sensation on the chest and greater than 50 pound weight loss x12 months.  Upon presentation to the ED, hemoglobin 6.9 K, WBC 30 K, D-dimer 3.5, initial troponin 49 lactic acidosis 2.4 creatinine 1.5 chest x-ray RLL pneumonia. CTA chest no PE, infiltrative mass of the mediastinum, complete collapse of the right lower lobe with associated complete right lower lobe bronchi occlusion due to endobronchial debris/lesion,?  Mass extension into the carina, left to right midline shift due to decreased lung volume-concern for malignancy likely of the mediastinum with extension into or around the right mainstem bronchi leading to collapse of the right lower lobe, mediastinal lymphadenopathy, 1.7 cm groundglass opacity right apex, small to moderate volume pericardial effusion, subacute to chronic fracture of the medial sternal body.   06/01/2021 underwent bronchoscopy which showed large subcarinal mass indenting the carina and bulging bilaterally with large mainstem lesion obscuring 50% of lumen, biopsies taken. Large pericardial effusion noted on 2D echo, seen by cardiology and cardiothoracic surgery, S/p left anterior thoracotomy with drainage of pericardial effusion -Open pericardial window by [name] 05/15/2021.  06/01/2021-underwent EGD/colonoscopy found to have prolapsed grade 4 rectal hemorrhoids.  Also started radiation therapy.  Post CT abdomen and pelvis with contrast for staging.  It revealed a large mass within the lower pelvis identified encasing the distal rectum and  herniating through the anus highly concerning for malignancy.  05/26/21-Had radiation therapy then was transfused 2 units PRBCs for hemoglobin of 7.4 K.   Hospital course complicated by intermittent rectal bleeding in the setting of internal hemorrhoids with prolapse and possible rectal mass.  Seen by general surgery, no plans for surgical intervention at this time.  MRI pelvis completed. Consider endoscopic ultrasound for biopsy to further define the nature of his pelvic mass in order to guide further treatment or goals of care decisions.  Hematology recommended continue palliative care discussion about goals of care.  Long-term plan is hospice care.   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 Patient presented with large subcarinal/mediastinal mass extending into the carina, Mediastinal lymphadenopathy with right upper lobe nodule, Right lower lobe atelectasis with endobronchial lesion: S/p bronchoscopy 10/15- mass potential to cause central airway obstruction. biopsy showed atypical cells. Rad Onc has seen and radiotherapy started  on 10/19 urgently.  Second radiation session on 05/26/2021.  Medical oncology input appreciated started initial staging with MRI of the brain with and without-that was normal, Biopsy of pericardium showed metastatic carcinoma. Palliative care consulted to discuss goals of care.  Conversation in process to discuss hospice care.  Malignant large pericardial effusion.: S/p open  pericardial  window by [name] 05/26/2021.   Drain removed on 05/22/21 and chest x-ray no pneumothorax.   Pericardial biopsy showed metastatic carcinoma.  Continue colchicine. Pain is well controlled with current pain management.   Pelvic mass could be squamous cell carcinoma: Large mass within the lower pelvis identified encasing the distal rectum and herniated through the anus with concern for malignancy. MRI pelvis with and without contrast ordered on 05/27/2021. Medical oncology and general surgery following. Surgery recommended consider endoscopic ultrasound for biopsy to further define the nature of pelvic mass in order to guide further treatment or goals of care decisions. Conversation in process to discuss hospice care.    Chronic blood loss anemia in the setting of chronic internal hemorrhoids. Transfused 2 units PRBCs on 05/26/2021 for hemoglobin of 7.4 K. Continue to monitor H&H.  Hemoglobin today morning 8.7   Chest discomfort: S/p pericardial window for malignant pericardial effusion Pain is improved with leaning forward. Continue colchicine. Continue as needed narcotic analgesics.   Leukocytosis suspect multifactorial.   Leukocytosis is downtrending, 20.0 from 30.6. Completed Rocephin for CAP X10 days, last day 05/26/2021. Remains afebrile. Continue to monitor.   Acute hypoxic respiratory failure > resolved. He was hypoxic 80% on room air with respiratory distress on admission 2/2 pneumonia/mediastinal mass.  Currently on room air with O2 saturation 96%.   Sepsis secondary to pneumonia /postobstructive pneumonia: In the setting of malignancy.  Completed antibiotics course.  Hematochezia/melena Acute blood loss anemia in the setting of painless rectal bleeding due to chronic large internal hemorrhoids with prolapse.   Anemia of chronic disease  S/P multiple PRBC transfusions.  Hb. Stable now. Transfuse if less than 7 g.   S/p EGD colonoscopy 10/18-mild  esophagitis-positive for Candida esophagitis- large grade 4 prolapsed hemorrhoids-general surgery consulted and advised medical and topical care no plan for surgery at this time- w/ H prep, sitz bath (patient refusing bath).  Transfuse to keep hemoglobin above 8 g may need additional transfusion prior to discharge.   Candida esophagitis:  Continue fluconazole.   Dysphagia Had trouble swallowing his p.o. medications. Aspiration precautions in place. Tolerating soft blend diet   Hyperkalemia: Lokelma x 1 given. Continue to monitor   AKI : Suspect prerenal due to decreased p.o. intake. Continue IV gentle hydration. Nephrology consulted.   COPD: Currently not in exacerbation. Continue Dulera,Incruse Ellipta and bronchodilators as needed   Severe protein calorie malnutrition with significant weight loss and BMI 16.45.   Severe muscle mass loss. Albumin 3.3 on 05/23/2021. In the setting of malignancy overall poor prognostic indicator. Appreciate dietitian's assistance. Continue Marinol Continue to encourage increase oral protein calorie intake.   History of alcohol use/tobacco use :  Continue thiamine,  folic acid multivitamins.   Deconditioning:  Evaluated by PT recommendation for home health PT. Continue PT OT with assistance    Nutrition Problem: Severe Malnutrition Etiology: acute illness (newly diagnosed lung cancer) Signs/Symptoms: severe muscle depletion, severe fat depletion, percent weight loss (6% weight loss in one month) Percent weight loss: 6 % Interventions: MVI, Magic cup, Ensure Enlive (each supplement provides 350kcal and 20 grams of protein)   Thrombocytosis, improving, suspect in the setting of acute illness. Platelet count downtrending 520 K from 740 K. Continue to monitor.  DVT prophylaxis: SCDs Code Status: Full code. Family Communication: Fianc at bedside. Disposition Plan:   Status is: Inpatient  Remains inpatient appropriate because: Pending  work-up for malignancy. Needs goals of care discussion.   Patient's fianc of 10 years wants to get  married, tentatively scheduled on Thursday.   Anticipated discharge in 3 days.  Consultants:  Palliative care Oncology General surgery  Procedures:  Antimicrobials:   Anti-infectives (From admission, onward)    Start     Dose/Rate Route Frequency Ordered Stop   05/25/21 1000  fluconazole (DIFLUCAN) tablet 200 mg       See Hyperspace for full Linked Orders Report.   200 mg Oral Daily 05/24/21 0834 06/07/21 0959   05/24/21 1000  fluconazole (DIFLUCAN) tablet 400 mg       See Hyperspace for full Linked Orders Report.   400 mg Oral  Once 05/24/21 0834 05/24/21 0913   05/04/2021 2145  cefTRIAXone (ROCEPHIN) 1 g in sodium chloride 0.9 % 100 mL IVPB        1 g 200 mL/hr over 30 Minutes Intravenous Every 24 hours 05/04/2021 2053 05/25/21 2212   05/28/2021 2145  azithromycin (ZITHROMAX) 500 mg in sodium chloride 0.9 % 250 mL IVPB        500 mg 250 mL/hr over 60 Minutes Intravenous Every 24 hours 05/04/2021 2053 05/06/2021 2322   05/07/2021 1545  ceFEPIme (MAXIPIME) 2 g in sodium chloride 0.9 % 100 mL IVPB        2 g 200 mL/hr over 30 Minutes Intravenous  Once 05/06/2021 1539 05/18/2021 1640        Subjective: Patient was seen and examined at bedside.  Overnight events noted.   Patient reports feeling bad, pain is reasonably controlled.  He appears chronically ill. He is not a candidate for systemic chemotherapy.  Objective: Vitals:   06/02/21 2333 06/03/21 0432 06/03/21 0815 06/03/21 1110  BP: 102/79 94/61 114/69 119/85  Pulse: (!) 102 100    Resp: 20 20    Temp:  (!) 97.5 F (36.4 C)    TempSrc:  Oral    SpO2:  100%  100%  Weight:      Height:        Intake/Output Summary (Last 24 hours) at 06/03/2021 1350 Last data filed at 06/02/2021 1849 Gross per 24 hour  Intake 118 ml  Output --  Net 118 ml   Filed Weights   05/14/2021 1948 05/29/21 0243  Weight: 61.3 kg 61.3 kg     Examination:  General exam: Appears comfortable, chronically ill looking.  Not in any distress. Respiratory system: Clear to auscultation. Respiratory effort normal.  RR 16 Cardiovascular system: S1-S2 heard, regular rate and rhythm, no murmur. Gastrointestinal system: Abdomen is soft, nontender, nondistended, BS + Central nervous system: Alert and oriented X 3. No focal neurological deficits. Extremities: No edema, no cyanosis, no clubbing. Skin: No rashes, lesions or ulcers Psychiatry: Judgement and insight appear normal. Mood & affect appropriate.     Data Reviewed: I have personally reviewed following labs and imaging studies  CBC: Recent Labs  Lab 05/29/21 0146 05/31/21 0048 06/01/21 0436 06/02/21 0128 06/03/21 0213  WBC 18.5* 20.6* 22.4* 21.6* 20.0*  HGB 8.6* 8.7* 8.9* 8.7* 7.9*  HCT 27.0* 27.9* 28.1* 27.4* 25.0*  MCV 80.1 81.3 80.5 79.4* 79.9*  PLT 490* 520* 487* 490* 614*   Basic Metabolic Panel: Recent Labs  Lab 05/29/21 0146 05/31/21 0048 06/01/21 0436 06/02/21 0128 06/02/21 0907 06/02/21 1612 06/03/21 0213  NA 138 137 137 136  --   --  137  K 4.1 4.3 5.1 5.4* 5.9* 5.2* 5.6*  CL 106 105 105 104  --   --  106  CO2 24 23 17* 19*  --   --  18*  GLUCOSE 93 107* 105* 127*  --   --  122*  BUN 23* 22* 25* 38*  --   --  55*  CREATININE 1.26* 1.25* 1.35* 1.78*  --   --  2.16*  CALCIUM 8.7* 9.0 9.1 9.0  --   --  9.0  MG 2.3 2.2 2.2 2.4  --   --  2.5*  PHOS 4.2 4.3 5.1* 6.4*  --   --  7.3*   GFR: Estimated Creatinine Clearance: 35.5 mL/min (A) (by C-G formula based on SCr of 2.16 mg/dL (H)). Liver Function Tests: Recent Labs  Lab 05/28/21 0146 06/02/21 0128  AST 278* 65*  ALT 476* 189*  ALKPHOS 389* 171*  BILITOT 1.1 1.0  PROT 6.5 6.8  ALBUMIN 2.5* 2.7*   No results for input(s): LIPASE, AMYLASE in the last 168 hours. No results for input(s): AMMONIA in the last 168 hours. Coagulation Profile: No results for input(s): INR, PROTIME in the last  168 hours. Cardiac Enzymes: No results for input(s): CKTOTAL, CKMB, CKMBINDEX, TROPONINI in the last 168 hours. BNP (last 3 results) No results for input(s): PROBNP in the last 8760 hours. HbA1C: No results for input(s): HGBA1C in the last 72 hours. CBG: No results for input(s): GLUCAP in the last 168 hours. Lipid Profile: No results for input(s): CHOL, HDL, LDLCALC, TRIG, CHOLHDL, LDLDIRECT in the last 72 hours. Thyroid Function Tests: No results for input(s): TSH, T4TOTAL, FREET4, T3FREE, THYROIDAB in the last 72 hours. Anemia Panel: No results for input(s): VITAMINB12, FOLATE, FERRITIN, TIBC, IRON, RETICCTPCT in the last 72 hours. Sepsis Labs: No results for input(s): PROCALCITON, LATICACIDVEN in the last 168 hours.   No results found for this or any previous visit (from the past 240 hour(s)).   Radiology Studies: US RENAL  Result Date: 06/03/2021 CLINICAL DATA:  Renal dysfunction EXAM: RENAL / URINARY TRACT ULTRASOUND COMPLETE COMPARISON:  None. FINDINGS: Right Kidney: Renal measurements: 9.6 x 4.7 x 5 cm = volume: 118.1 mL. There is no hydronephrosis. There is increased cortical echogenicity. Left Kidney: Renal measurements: 9.4 x 5.6 x 5 cm = volume: 136.1 mL. There is no hydronephrosis. There is increased cortical echogenicity. Bladder: No focal abnormality is seen in the visualized portions of the urinary bladder. Ureteral jets were not observed. Other: Minimal ascites is present. IMPRESSION: There is no hydronephrosis. There is increased cortical echogenicity suggesting medical renal disease. Minimal ascites. Electronically Signed   By: Elmer Picker M.D.   On: 06/03/2021 11:39     Scheduled Meds:  (feeding supplement) PROSource Plus  30 mL Oral BID BM   colchicine  0.3 mg Oral Daily   dronabinol  2.5 mg Oral BID AC   feeding supplement  237 mL Oral QID   fluconazole  200 mg Oral Daily   folic acid  1 mg Oral Daily   hydrocortisone  25 mg Rectal BID    hydrocortisone-pramoxine  1 applicator Rectal BID   LORazepam  1 mg Intravenous Once   mometasone-formoterol  2 puff Inhalation BID   And   umeclidinium bromide  1 puff Inhalation Daily   multivitamin with minerals  1 tablet Oral Daily   pantoprazole sodium  40 mg Oral BID   sodium bicarbonate  1,300 mg Oral BID   sodium zirconium cyclosilicate  10 g Oral Once   thiamine  100 mg Oral Daily   Or   thiamine  100 mg Intravenous Daily   Continuous Infusions:  sodium chloride  sodium chloride 100 mL/hr at 06/03/21 1202     LOS: 18 days    Time spent: 35 mins    Cathline Dowen, MD [facility]   If 7PM-7AM, please contact night-coverage

## 2021-06-03 NOTE — Consult Note (Signed)
Nephrology Consult   Requesting provider: Dr. Dwyane Dee Service requesting consult: [facility]  Reason for consult: AKI    Assessment/Recommendations: Christopher Henry is a/an 50 y.o. male with a past medical history significant for metastatic non-small cell lung cancer, COPD, HTN, alcohol use disorder, tobacco use disorder, who presented w/ acute hypoxic respiratory distress, non-productive cough and hematemesis.    AKI: Cr 1.78>2.16 (baseline appears to be around 1). Likely secondary to pre-renal cause of dehydration/decreased PO. Patient is malnourished with BMI 16 and metastatic lung cancer. Must also consider Tumor Lysis syndrome in the setting of malignancy though patient without symptoms (nausea, vomiting, diarrhea).  -Appreciate Palliative's assistance -Will give 500 mL bolus followed by NS 100 mL/hr -Renal ultrasound to rule out obstruction as cause -Uric acid -UA  -Encourage PO intake as tolerated  -Continue to monitor daily Cr, Dose meds for GFR -Monitor Daily I/Os, Daily weight  -Maintain MAP>65 for optimal renal perfusion.  -Avoid nephrotoxic medications including NSAIDs and Vanc/Zosyn combo -Currently no indication for HD  Non-small cell lung cancer  Malignant Pericardial Effusion s/p pericardial window: Oncology on board, started on radiotherapy 10/19. Not a candidate for systemic therapy given continued decline. Palliative consulted to establish goals of care. Continued on colchicine. Pain management per primary.  Hyperkalemia: potassium 5.6. One dose Lokelma ordered. Sodium bicarb 1300 mg BID. Will obtain  BMP in AM.  Pelvic Mass: seen on CT Abd/Pelv 10/23. Highly concerning for malignancy but unclear if related to lung cancer. Surgery is recomending endoscopic ultrasound for biopsy. Oncology on board.  Leukocytosis: WBC 20.6>20.0. S/p 10 day course of Rocephin for CAP. Afebrile.   Candida esophagitis: On fluconazole  History of HTN: now normotensive and off anti-HTN  medications.   Microcytic Anemia: Hgb 7.9. S/p 5 units of transfusion thus far- most recently received 2U on 10/24. Likely multifactorial from bleeding internal hemorrhoids with prolapse, rectal mass and malignancy.  -Transfuse for Hgb<7 g/dL -No role for ESA in this setting  Alcohol Use  Tobacco Use: on Thiamine, MVI and folic acid.  Cachectic, poor PO intake. Nutrition on board- on feeding supplement. Palliative consulted.  Volume Status: Appears volume down on exam. Based on our examination and review of available imaging, our recommendation is hydration via IV and PO (as tolerated).   Sharion Settler, DO Family Medicine Resident, PGY-2 06/03/2021 9:22 AM   _____________________________________________________________________________________ CC: Shortness of breath  History of Present Illness: Christopher Henry is a/an 50 y.o. male with a past medical history of COPD, HTN, tobacco and alcohol use disorder who was admitted to the hospital on 10/14 for worsening shortness of breath and hypoxia to 80%.   He was sent from pulmonary office to the ED for respiratory distress. Patient also complained of non-productive cough and hematemesis for the past 2 months. He has notably had a greater than 50 pound weight loss in the last year. In the ED his hemoglobin was 6.9 and he was transfused. His WBC was 30k, d-dimer elevated to 35, lactic acid 2.4, creatinine 1.5. CXR with concern for RLL pneumonia. CTA of chest showed infiltrative mass of the mediastinum with complete collapse of RLL with concern for malignancy. There was a small to moderate volume pericardial effusion.   Patient has started radiotherapy inpatient and both oncology and Palliative are on board. He has had complaints of dysphagia for which SLP was consulted. His creatinine has been slowly up-trending over the last several days. He admits to not taking in as much fluids "as I probably should".  He notes continued intermittent blood per  rectum but denies any hematuria or dysuria. He was able to void this morning without issues.   Nephrology has been consulted to help manage his AKI and hyperkalemia of 5.6.   Medications:  Current Facility-Administered Medications  Medication Dose Route Frequency Provider Last Rate Last Admin   (feeding supplement) PROSource Plus liquid 30 mL  30 mL Oral BID BM Kc, Ramesh, MD   30 mL at 06/02/21 1321   0.9 %  sodium chloride infusion  10 mL/hr Intravenous Once Coralee Pesa, MD       acetaminophen (TYLENOL) 160 MG/5ML solution 650 mg  650 mg Oral Q6H PRN Kc, Ramesh, MD       albuterol (PROVENTIL) (2.5 MG/3ML) 0.083% nebulizer solution 2.5 mg  2.5 mg Nebulization Q4H PRN Orma Flaming, MD       colchicine tablet 0.3 mg  0.3 mg Oral Daily Kc, Ramesh, MD   0.3 mg at 06/02/21 3419   docusate sodium (COLACE) capsule 100 mg  100 mg Oral Daily PRN Winferd Humphrey, PA-C       dronabinol (MARINOL) capsule 2.5 mg  2.5 mg Oral BID AC Kc, Maren Beach, MD   2.5 mg at 06/02/21 1717   feeding supplement (ENSURE ENLIVE / ENSURE PLUS) liquid 237 mL  237 mL Oral QID Kc, Maren Beach, MD   237 mL at 06/02/21 2143   fluconazole (DIFLUCAN) tablet 200 mg  200 mg Oral Daily Kc, Maren Beach, MD   200 mg at 62/22/97 9892   folic acid (FOLVITE) tablet 1 mg  1 mg Oral Daily Irene Pap N, DO   1 mg at 06/02/21 1194   hydrocortisone (ANUSOL-HC) suppository 25 mg  25 mg Rectal BID Noemi Chapel P, DO   25 mg at 05/24/21 1740   hydrocortisone-pramoxine (PROCTOFOAM-HC) rectal foam 1 applicator  1 applicator Rectal BID Jackquline Denmark, MD   1 applicator at 81/44/81 8563   LORazepam (ATIVAN) injection 1 mg  1 mg Intravenous Once Shawna Clamp, MD       melatonin tablet 5 mg  5 mg Oral QHS PRN Shela Leff, MD   5 mg at 05/22/2021 2215   mometasone-formoterol (DULERA) 200-5 MCG/ACT inhaler 2 puff  2 puff Inhalation BID Orma Flaming, MD   2 puff at 06/03/21 0825   And   umeclidinium bromide (INCRUSE ELLIPTA) 62.5 MCG/INH 1 puff  1  puff Inhalation Daily Orma Flaming, MD   1 puff at 06/02/21 0813   morphine 2 MG/ML injection 2 mg  2 mg Intravenous Q2H PRN Barrett, Erin R, PA-C   2 mg at 06/01/21 1497   multivitamin with minerals tablet 1 tablet  1 tablet Oral Daily Irene Pap N, DO   1 tablet at 06/02/21 0904   pantoprazole sodium (PROTONIX) 40 mg/20 mL oral suspension 40 mg  40 mg Oral BID Irene Pap N, DO   40 mg at 06/02/21 2143   polyethylene glycol (MIRALAX / GLYCOLAX) packet 17 g  17 g Oral Daily PRN Richard Miu H, PA-C       sodium bicarbonate tablet 1,300 mg  1,300 mg Oral BID Reesa Chew, MD       sodium zirconium cyclosilicate (LOKELMA) packet 10 g  10 g Oral Once Shawna Clamp, MD       sodium zirconium cyclosilicate (LOKELMA) packet 10 g  10 g Oral Once Shawna Clamp, MD       thiamine tablet 100 mg  100 mg Oral Daily  Irene Pap N, DO   100 mg at 06/02/21 5284   Or   thiamine (B-1) injection 100 mg  100 mg Intravenous Daily Irene Pap N, DO   100 mg at 05/30/21 1117   traMADol (ULTRAM) tablet 50 mg  50 mg Oral Q6H PRN Shawna Clamp, MD   50 mg at 06/02/21 1324   witch hazel-glycerin (TUCKS) pad   Topical PRN Winferd Humphrey, PA-C         ALLERGIES Patient has no known allergies.  MEDICAL HISTORY Past Medical History:  Diagnosis Date   Anxiety state, unspecified 11/09/2012   Hypertension      SOCIAL HISTORY Social History   Socioeconomic History   Marital status: Soil scientist    Spouse name: Not on file   Number of children: Not on file   Years of education: Not on file   Highest education level: Not on file  Occupational History   Not on file  Tobacco Use   Smoking status: Former    Packs/day: 2.00    Years: 35.00    Pack years: 70.00    Types: Cigarettes    Quit date: 05/12/2021    Years since quitting: 0.0   Smokeless tobacco: Never  Substance and Sexual Activity   Alcohol use: Yes    Comment: 1 pint a day   Drug use: No   Sexual activity: Not on file   Other Topics Concern   Not on file  Social History Narrative   Not on file   Social Determinants of Health   Financial Resource Strain: Not on file  Food Insecurity: Not on file  Transportation Needs: Not on file  Physical Activity: Not on file  Stress: Not on file  Social Connections: Not on file  Intimate Partner Violence: Not on file     FAMILY HISTORY Family History  Problem Relation Age of Onset   Hypertension Brother    Cancer Maternal Grandmother        unknown to pt      Review of Systems: 12 systems reviewed Otherwise as per HPI, all other systems reviewed and negative  Physical Exam: Vitals:   06/02/21 2333 06/03/21 0432  BP: 102/79 94/61  Pulse: (!) 102 100  Resp: 20 20  Temp:  (!) 97.5 F (36.4 C)  SpO2:  100%   No intake/output data recorded.  Intake/Output Summary (Last 24 hours) at 06/03/2021 4010 Last data filed at 06/02/2021 1849 Gross per 24 hour  Intake 236 ml  Output --  Net 236 ml   General: cachectic, poor dentition, chronically ill appearing male HEENT: anicteric sclera, dry mucous membranes, poor dentition CV: regular rate, normal rhythm, no murmurs, no gallops, no rubs, no peripheral edema Lungs: Normal work of breathing, fine intermittent crackles appreciated in RLL  Abd: soft, non-distended, mildly tender in all quadrants without rebound/guarding Skin: no visible lesions Psych: alert, flat-affect Musculoskeletal: no obvious deformities Neuro: normal speech  Test Results Reviewed Lab Results  Component Value Date   NA 137 06/03/2021   K 5.6 (H) 06/03/2021   CL 106 06/03/2021   CO2 18 (L) 06/03/2021   BUN 55 (H) 06/03/2021   CREATININE 2.16 (H) 06/03/2021   CALCIUM 9.0 06/03/2021   ALBUMIN 2.7 (L) 06/02/2021   PHOS 7.3 (H) 06/03/2021    I have reviewed all relevant outside healthcare records related to the patient's current hospitalization

## 2021-06-03 NOTE — Progress Notes (Signed)
Nutrition Follow-up  DOCUMENTATION CODES:   Severe malnutrition in context of acute illness/injury, Underweight  INTERVENTION:   Continue Ensure Enlive/Plus po QID, each supplement provides 350 kcal and 13-20 grams of protein. Continue Magic cup or substitute TID with meals, each supplement provides 290-300 kcal and 9 grams of protein. Continue Prosource Plus 30 ml PO BID, each packet provides 100 kcal and 15 gm protein. Continue MVI with minerals daily. Recommend daily weights.  NUTRITION DIAGNOSIS:   Severe Malnutrition related to acute illness (newly diagnosed lung cancer) as evidenced by severe muscle depletion, severe fat depletion, percent weight loss (6% weight loss in one month).  Ongoing   GOAL:   Patient will meet greater than or equal to 90% of their needs  Progressing   MONITOR:   PO intake, Supplement acceptance, Labs  REASON FOR ASSESSMENT:   Consult Poor PO  ASSESSMENT:   Pt with PMH significant for EtOH abuse, tobacco use, HTN, and anxiety admitted with hematochezia, occasional melena, anemia, weight loss, and mediastinal mass concerning for malignancy (biopsied via bronchoscopy, results pending).  Palliative care team is following patient.  Patient reports ongoing poor appetite.  He is drinking 2-3 Ensure supplements per day and taking ProSource Plus supplement BID.   Labs reviewed.   Medications reviewed and include Marinol, folic acid, MVI with minerals, Protonix, Lokelma, thiamine.   New weight 10/27 61.3 kg (same as 10/14 ? accuracy)  Diet Order:   Diet Order             Diet regular Room service appropriate? Yes; Fluid consistency: Thin  Diet effective now                   EDUCATION NEEDS:   Education needs have been addressed  Skin:  Skin Assessment: Reviewed RN Assessment  Last BM:  10/29  Height:   Ht Readings from Last 1 Encounters:  05/29/21 6\' 4"  (1.93 m)    Weight:   Wt Readings from Last 1 Encounters:   05/29/21 61.3 kg    BMI:  Body mass index is 16.45 kg/m.  Estimated Nutritional Needs:   Kcal:  2150-2350  Protein:  105-120 grams  Fluid:  >2L/d    Lucas Mallow, RD, LDN, CNSC Please refer to Amion for contact information.

## 2021-06-03 NOTE — Progress Notes (Signed)
Occupational Therapy Treatment Patient Details Name: Christopher Henry MRN: [id number] DOB: [birth year] Today's Date: 06/03/2021   History of present illness The pt is a 50 yo male presenting 10/14 due to SOB with hypoxia. Pt also unintentional weight loss and hemoptysis x2.  Pt found to have pericardial effusion in the setting of locally advanced lung cancer as well as prolapsed grade IV hemorrhoids. Underwent pericardial window 10/17. s/p EGD on 10/18, initiation of radiation on 10/18. CT on 10/23 showed large pelvic mass.  PMH includes: tobacco use (x35 years) and HTN.   OT comments  Patient received supine in bed and had recently returned from ultrasound. Patient was agreeable to OT session but was limited by fatigue and pain. Patient stood for nursing to get on scale and had to return to supine due to fatigue and light headiness. Patient required extra time to attempt to get up for self care and asked to use BSC.  Following BSC use patient required to go back to supine and stated he was too tired to do more.  Acute OT to continue to follow.    Recommendations for follow up therapy are one component of a multi-disciplinary discharge planning process, led by the attending physician.  Recommendations may be updated based on patient status, additional functional criteria and insurance authorization.    Follow Up Recommendations  Home health OT    Assistance Recommended at Discharge Intermittent Supervision/Assistance  Equipment Recommendations  BSC;Other (comment)    Recommendations for Other Services      Precautions / Restrictions Precautions Precautions: Fall Restrictions Weight Bearing Restrictions: No       Mobility Bed Mobility Overal bed mobility: Modified Independent             General bed mobility comments: able to get out and into bed without assistance    Transfers Overall transfer level: Needs assistance Equipment used: None Transfers: Sit to/from Stand;Stand Pivot  Transfers Sit to Stand: Supervision Stand pivot transfers: Supervision         General transfer comment: stand pivot transfer to [facility]     Balance Overall balance assessment: Needs assistance Sitting-balance support: Feet supported;No upper extremity supported Sitting balance-Leahy Scale: Good     Standing balance support: Bilateral upper extremity supported;During functional activity Standing balance-Leahy Scale: Poor Standing balance comment: limited standing tolerance due to fatigue                           ADL either performed or assessed with clinical judgement   ADL Overall ADL's : Needs assistance/impaired                         Toilet Transfer: Media planner Details (indicate cue type and reason): transferred from eob to [place] and Hygiene: Supervision/safety;Sitting/lateral lean Toileting - Clothing Manipulation Details (indicate cue type and reason): performed toilet hygiene seated       General ADL Comments: patient was only able to tolerate toilting for self care     Vision       Perception     Praxis      Cognition Arousal/Alertness: Awake/alert Behavior During Therapy: Flat affect Overall Cognitive Status: Within Functional Limits for tasks assessed                                 General Comments: Patient demonstrated increased weakness  and light headiness          Exercises     Shoulder Instructions       General Comments      Pertinent Vitals/ Pain       Pain Assessment: Faces Faces Pain Scale: Hurts even more Pain Location: abdomen and rectum Pain Descriptors / Indicators: Aching;Discomfort;Grimacing Pain Intervention(s): Limited activity within patient's tolerance  Home Living                                          Prior Functioning/Environment              Frequency  Min 2X/week        Progress Toward  Goals  OT Goals(current goals can now be found in the care plan section)  Progress towards OT goals: Progressing toward goals  Acute Rehab OT Goals OT Goal Formulation: With patient Time For Goal Achievement: 06/07/21 Potential to Achieve Goals: Fair ADL Goals Pt Will Perform Grooming: sitting;with modified independence Pt Will Perform Lower Body Bathing: with modified independence;sit to/from stand Pt Will Perform Lower Body Dressing: with modified independence;sit to/from stand;with adaptive equipment Pt Will Transfer to Toilet: with modified independence;ambulating;regular height toilet Pt Will Perform Toileting - Clothing Manipulation and hygiene: with modified independence;sitting/lateral leans  Plan Discharge plan remains appropriate;Frequency remains appropriate    Co-evaluation                 AM-PAC OT "6 Clicks" Daily Activity     Outcome Measure   Help from another person eating meals?: A Little Help from another person taking care of personal grooming?: A Little Help from another person toileting, which includes using toliet, bedpan, or urinal?: A Little Help from another person bathing (including washing, rinsing, drying)?: A Little Help from another person to put on and taking off regular upper body clothing?: A Little Help from another person to put on and taking off regular lower body clothing?: A Little 6 Click Score: 18    End of Session    OT Visit Diagnosis: Unsteadiness on feet (R26.81);Muscle weakness (generalized) (M62.81);Pain Pain - Right/Left: Right Pain - part of body: Knee   Activity Tolerance Patient limited by fatigue   Patient Left in bed;with call bell/phone within reach;with family/visitor present;with nursing/sitter in room   Nurse Communication Mobility status        Time: 6269-4854 OT Time Calculation (min): 23 min  Charges: OT General Charges $OT Visit: 1 Visit OT Treatments $Self Care/Home Management : 23-37  mins  Lodema Hong, [place]  Pager [phone] Office [place] 06/03/2021, 11:34 AM

## 2021-06-03 DEATH — deceased

## 2021-06-04 ENCOUNTER — Ambulatory Visit: Payer: 59 | Admitting: Gastroenterology

## 2021-06-04 ENCOUNTER — Ambulatory Visit
Admit: 2021-06-04 | Discharge: 2021-06-04 | Disposition: A | Discharge status: Home / Self Care | Payer: 59 | Attending: Radiation Oncology | Admitting: Radiation Oncology

## 2021-06-04 DIAGNOSIS — N179 Acute kidney failure, unspecified: Secondary | ICD-10-CM

## 2021-06-04 LAB — PHOSPHORUS: Phosphorus: 7.1 mg/dL — ABNORMAL HIGH (ref 2.5–4.6)

## 2021-06-04 LAB — BASIC METABOLIC PANEL
Anion gap: 17 — ABNORMAL HIGH (ref 5–15)
Anion gap: 18 — ABNORMAL HIGH (ref 5–15)
BUN: 65 mg/dL — ABNORMAL HIGH (ref 6–20)
BUN: 70 mg/dL — ABNORMAL HIGH (ref 6–20)
CO2: 13 mmol/L — ABNORMAL LOW (ref 22–32)
CO2: 15 mmol/L — ABNORMAL LOW (ref 22–32)
Calcium: 8.5 mg/dL — ABNORMAL LOW (ref 8.9–10.3)
Calcium: 8.8 mg/dL — ABNORMAL LOW (ref 8.9–10.3)
Chloride: 106 mmol/L (ref 98–111)
Chloride: 105 mmol/L (ref 98–111)
Creatinine, Ser: 2.21 mg/dL — ABNORMAL HIGH (ref 0.61–1.24)
Creatinine, Ser: 2.3 mg/dL — ABNORMAL HIGH (ref 0.61–1.24)
GFR, Estimated: 35 mL/min — ABNORMAL LOW (ref >60)
GFR, Estimated: 34 mL/min — ABNORMAL LOW (ref >60)
Glucose, Bld: 102 mg/dL — ABNORMAL HIGH (ref 70–99)
Glucose, Bld: 119 mg/dL — ABNORMAL HIGH (ref 70–99)
Potassium: 5.9 mmol/L — ABNORMAL HIGH (ref 3.5–5.1)
Potassium: 6.1 mmol/L — ABNORMAL HIGH (ref 3.5–5.1)
Sodium: 136 mmol/L (ref 135–145)
Sodium: 138 mmol/L (ref 135–145)

## 2021-06-04 LAB — CBC
HCT: 25.2 % — ABNORMAL LOW (ref 39.0–52.0)
Hemoglobin: 7.7 g/dL — ABNORMAL LOW (ref 13.0–17.0)
MCH: 24.9 pg — ABNORMAL LOW (ref 26.0–34.0)
MCHC: 30.6 g/dL (ref 30.0–36.0)
MCV: 81.6 fL (ref 80.0–100.0)
Platelets: 325 10*3/uL (ref 150–400)
RBC: 3.09 MIL/uL — ABNORMAL LOW (ref 4.22–5.81)
RDW: 20.7 % — ABNORMAL HIGH (ref 11.5–15.5)
WBC: 22.7 10*3/uL — ABNORMAL HIGH (ref 4.0–10.5)
nRBC: 0.1 % (ref 0.0–0.2)

## 2021-06-04 LAB — MAGNESIUM: Magnesium: 2.6 mg/dL — ABNORMAL HIGH (ref 1.7–2.4)

## 2021-06-04 MED ORDER — SODIUM ZIRCONIUM CYCLOSILICATE 10 G PO PACK: 10.0000 g | PACK | Freq: Once | ORAL | Status: DC

## 2021-06-04 MED ORDER — SODIUM ZIRCONIUM CYCLOSILICATE 10 G PO PACK
10.0000 g | PACK | Freq: Three times a day (TID) | ORAL | Status: DC
  Administered 2021-06-04 (×2): 10 g via ORAL
  Filled 2021-06-04 (×3): qty 1

## 2021-06-04 MED ORDER — SODIUM BICARBONATE 8.4 % IV SOLN
INTRAVENOUS | Status: DC
  Filled 2021-06-04 (×6): qty 1000

## 2021-06-04 MED ORDER — SODIUM ZIRCONIUM CYCLOSILICATE 10 G PO PACK
10.0000 g | PACK | Freq: Once | ORAL | Status: AC
  Administered 2021-06-04: 10 g via ORAL
  Filled 2021-06-04: qty 1

## 2021-06-04 NOTE — Progress Notes (Signed)
Patient very tired and refused afternoon meds, lokelma prepared and given, educated patient on it and tried to give it to him, fiance at bedside and continuing to try to get him to drink it.  Chrisandra Carota, RN 06/04/2021 4:12 PM

## 2021-06-04 NOTE — Progress Notes (Signed)
This chaplain followed up with Pt. fiancee-Lisa to clarify the process of having a hospital wedding.  The Pt. is at [facility] for radiation at the time of the chaplain's visit. The chaplain is appreciative of the RN-Natalie communication with the chaplain and [name].  The chaplain understands [name] does not have the marriage license from [place] during this visit. The marriage license is mandatory for a legal marriage. At this time [name] prefers clergy outside of the hospital to perform the ceremony late Thursday afternoon. The Pt. best man and maid of honor plan to be the witnesses. Visitor restrictions, allowing no more than three, were confirmed with the unit.  This chaplain clarified the spiritual care department's willingness to be involved with Chaplain Ray.  The PMT NP-Mary joined the end of the visit with [name] and the chaplain. The chaplain understands PMT will revisit at [place] on Thursday.  The Pt. returned from radiation before the chaplain left the unit. [name] provided a copy of the Pt. Advance Directive, which notes her role as HCPOA. The chaplain placed a copy of the AD in the Pt. chart and returned the original to the Pt. folder. The Pt. was not able to clearly articulate his desires to be married in the hospital at the time of the chaplain's visit.  This chaplain will F/U with the Pt. and Lisa on Thursday.  Chaplain Sallyanne Kuster [phone]

## 2021-06-04 NOTE — Progress Notes (Incomplete)
Clarity of wedding policies/Visitor policy:  Prior to wedding pt/family needs to get their marriage license completed. The nurses and other staff on the unit are not allowed to participate in the ceremony in any way.  Only two maybe three visitors are allowed in the room on the day of.  Spiritual care or palliative care needs to communicate to the next of kin (patients brother) about the wedding ceremony prior to its occurrence.   Chrisandra Carota, RN 06/04/2021 11:30 AM

## 2021-06-04 NOTE — Progress Notes (Signed)
Pt returned from radiation, VSS, call light within reach, will continue to monitor.   Chrisandra Carota, RN 06/04/2021 11:53 AM

## 2021-06-04 NOTE — Progress Notes (Signed)
Pt refused medications this morning. Hallucinating, not following many commands.  Pain medication administered due to expression of pain and being uncomfortable after bowl movements.   Chrisandra Carota, RN 06/04/2021 9:20 AM

## 2021-06-04 NOTE — Progress Notes (Signed)
Pt fiance informed me that marriage license has been complete for tomorrow.   Chrisandra Carota, RN 06/04/2021 2:12 PM

## 2021-06-04 NOTE — Progress Notes (Signed)
PROGRESS NOTE    Christopher Christopher  SMO:[id number] DOB: Jul 31, 1971 DOA: 05/18/2021 PCP: Bary Castilla, NP    Brief Narrative:  Christopher Christopher was admitted to the hospital with the working diagnosis of community acquired pneumonia, complicated with acute hypoxemic respiratory failure and sepsis.   50 year old male past medical history for hypertension and anxiety who presented with dyspnea and hypoxemia.  At home he had progressive dyspnea and generalized weakness.  Associated with cough and burning sensation across her chest.  He was seen at his pulmonologist office his oxygenation was 80%, he was placed on submental oxygen and transferred to the hospital for further evaluation.  On his initial physical examination he was afebrile, blood pressure 108/76, heart rate 134, respiratory 23, oxygen saturation 99% on 2 L of supplemental oxygen per nasal cannula.  His lungs had decreased breath sounds in the right side, no wheezing, heart S1-S2, present, rhythmic, abdomen soft, mild tender to palpation in the epigastric area, no lower extremity edema.  Sodium 133, potassium 3.9 chloride 101, bicarb 20, glucose 147, BUN 29, creatinine 1.6, white count 30.8, hemoglobin 6.9, hematocrit 21.6, platelets 534. SARS COVID-19 negative.  Urinalysis specific gravity 1.029, 100 protein.  Negative nitrates.  Chest radiograph with opacity of the right lower lobe.  Further work-up with CT chest which was negative for pulmonary embolism.  Mild narrowing with no significant stenosis of the right main pulmonary artery due to external mass affect.  Infiltrative mass of the mediastinum with marked limited evaluation.  Complete collapse of the right lower lobe associated with complete right lower lobe bronchi occlusion due to endobronchial debris/lesion.  Questionable mass in the carina.  Positive mediastinal lymphadenopathy.  1.7 cm groundglass airspace opacity right apex.  10/15 patient underwent bronchoscopy which showed  large subcarinal mass indenting the carina and bulging bilaterally with large mainstem lesion obscuring 50% of the lumen. Patient was diagnosed with large pericardial effusion.  Echocardiography, CT surgery was consulted and patient underwent pericardial window on 05/27/2021, for malignant pericardial effusion.  On 10/18 patient underwent EGD/colonoscopy, CT of the abdomen pelvis with contrast for staging which showed a large mass within the lower pelvis and caseating distal rectum and herniating through the annulus.  Patient was diagnosed with metastatic non-small cell lung cancer. Patient was placed started on radiation therapy for his lung mass.  Possible discharge to home with hospice, not candidate for systemic treatment this time.   Assessment & Plan:   Principal Problem:   Non-small cell lung cancer with metastasis (Christopher Christopher) Active Problems:   Acute hypoxemic respiratory failure ([name])   Symptomatic anemia   Mediastinal mass   AKI (acute kidney injury) ([place])   Gastrointestinal hemorrhage   Sepsis due to pneumonia ([place])   COPD (chronic obstructive pulmonary disease) ([name])   Acute blood loss anemia   Malignant pericardial effusion   Protein-calorie malnutrition, severe   Perianal mass   Non small cell cancer with metastasis, right lower lung collapse (post obstructive pneumonia with sepsis). Pericardial malignant effusion. Pelvic mass.  Acute hypoxemic respiratory failure.  Patient very weak and deconditioned, positive dyspnea and chest pain with minimal efforts.  Not candidate for systemic cancer therapy. He is getting palliative radiation therapy.  Plan to continue with palliative radiation and as needed analgesics, oxymetry monitoring and supplemental 02 to keep 02 saturation 92% or greater. Follow with palliative care recommendations, possible home with hospice services.  Completed antibiotic therapy.  2. COPD No clinical signs of exacerbation.,   3. Anemia of chronic  disease,  related to malignancy thrombocytosis, sp multiple PRBC transfusion during this hospitalization. Positive candida esophagitis. Continue with fluconazole.   4. AKI, hyperkalemia, positive anion gap metabolic acidosis renal function with worsening serum cr no up to 2,30 with K at 6,1 and serum bicarbonate at 15.  Continue hydration with bicarb drip and follow renal function in am, avoid hypotension or nephrotoxic medications. Continue sodium zirconium for 3 doses.    5. History of alcohol abuse, severe protein calorie malnutrition. Continue with nutritional supplements and appetite stimulants.    Patient continue to be at high risk for worsening renal function   Status is: Inpatient   DVT prophylaxis: Scd   Code Status:   DNR   Family Communication:  I spoke with patient's fiance at the bedside, we talked in detail about patient's condition, plan of care and prognosis and all questions were addressed.      Nutrition Status: Nutrition Problem: Severe Malnutrition Etiology: acute illness (newly diagnosed lung cancer) Signs/Symptoms: severe muscle depletion, severe fat depletion, percent weight loss (6% weight loss in one month) Percent weight loss: 6 % Interventions: MVI, Magic cup, Ensure Enlive (each supplement provides 350kcal and 20 grams of protein)     Consultants:  Oncology  Radiation oncology Nephrology     Subjective: Patient continue to be very weak and deconditioned, positive dyspnea and chest pain with minimal efforts, no nausea or vomiting,   Objective: Vitals:   06/03/21 2327 06/04/21 0334 06/04/21 0845 06/04/21 1152  BP: 107/79 108/85 99/87 135/74  Pulse: 88 100 (!) 103 68  Resp: 16 16 20 17   Temp: 97.6 F (36.4 C) 97.9 F (36.6 C) 98.1 F (36.7 C) (!) 96.8 F (36 C)  TempSrc: Oral Oral Axillary Axillary  SpO2: 99% 100% 95% 99%  Weight:  58.6 kg    Height:        Intake/Output Summary (Last 24 hours) at 06/04/2021 1342 Last data filed at  06/04/2021 0856 Gross per 24 hour  Intake 961 ml  Output 300 ml  Net 661 ml   Filed Weights   05/05/2021 1948 05/29/21 0243 06/04/21 0334  Weight: 61.3 kg 61.3 kg 58.6 kg    Examination:   General: deconditioned and ill looking appearing  Neurology: Awake and alert, non focal  E ENT:  mild pallor, no icterus, oral mucosa moist Cardiovascular: No JVD. S1-S2 present, rhythmic, no gallops, rubs, or murmurs. No lower extremity edema. Pulmonary: positive breath sounds bilaterally, decreased air movement on the right base, no wheezing, rhonchi or rales. Gastrointestinal. Abdomen soft and non tender Skin. No rashes Musculoskeletal: no joint deformities     Data Reviewed: I have personally reviewed following labs and imaging studies  CBC: Recent Labs  Lab 05/31/21 0048 06/01/21 0436 06/02/21 0128 06/03/21 0213 06/04/21 0240  WBC 20.6* 22.4* 21.6* 20.0* 22.7*  HGB 8.7* 8.9* 8.7* 7.9* 7.7*  HCT 27.9* 28.1* 27.4* 25.0* 25.2*  MCV 81.3 80.5 79.4* 79.9* 81.6  PLT 520* 487* 490* 421* 353   Basic Metabolic Panel: Recent Labs  Lab 05/31/21 0048 06/01/21 0436 06/02/21 0128 06/02/21 0907 06/02/21 1612 06/03/21 0213 06/04/21 0240  NA 137 137 136  --   --  137 136  K 4.3 5.1 5.4* 5.9* 5.2* 5.6* 5.9*  CL 105 105 104  --   --  106 106  CO2 23 17* 19*  --   --  18* 13*  GLUCOSE 107* 105* 127*  --   --  122* 102*  BUN 22* 25* 38*  --   --  55* 65*  CREATININE 1.25* 1.35* 1.78*  --   --  2.16* 2.21*  CALCIUM 9.0 9.1 9.0  --   --  9.0 8.5*  MG 2.2 2.2 2.4  --   --  2.5* 2.6*  PHOS 4.3 5.1* 6.4*  --   --  7.3* 7.1*   GFR: Estimated Creatinine Clearance: 33.1 mL/min (A) (by C-G formula based on SCr of 2.21 mg/dL (H)). Liver Function Tests: Recent Labs  Lab 06/02/21 0128  AST 65*  ALT 189*  ALKPHOS 171*  BILITOT 1.0  PROT 6.8  ALBUMIN 2.7*   No results for input(s): LIPASE, AMYLASE in the last 168 hours. No results for input(s): AMMONIA in the last 168 hours. Coagulation  Profile: No results for input(s): INR, PROTIME in the last 168 hours. Cardiac Enzymes: No results for input(s): CKTOTAL, CKMB, CKMBINDEX, TROPONINI in the last 168 hours. BNP (last 3 results) No results for input(s): PROBNP in the last 8760 hours. HbA1C: No results for input(s): HGBA1C in the last 72 hours. CBG: No results for input(s): GLUCAP in the last 168 hours. Lipid Profile: No results for input(s): CHOL, HDL, LDLCALC, TRIG, CHOLHDL, LDLDIRECT in the last 72 hours. Thyroid Function Tests: No results for input(s): TSH, T4TOTAL, FREET4, T3FREE, THYROIDAB in the last 72 hours. Anemia Panel: No results for input(s): VITAMINB12, FOLATE, FERRITIN, TIBC, IRON, RETICCTPCT in the last 72 hours.    Radiology Studies: I have reviewed all of the imaging during this hospital visit personally     Scheduled Meds:  (feeding supplement) PROSource Plus  30 mL Oral BID BM   colchicine  0.3 mg Oral Daily   dronabinol  2.5 mg Oral BID AC   feeding supplement  237 mL Oral QID   fluconazole  200 mg Oral Daily   folic acid  1 mg Oral Daily   hydrocortisone  25 mg Rectal BID   hydrocortisone-pramoxine  1 applicator Rectal BID   LORazepam  1 mg Intravenous Once   mometasone-formoterol  2 puff Inhalation BID   And   umeclidinium bromide  1 puff Inhalation Daily   multivitamin with minerals  1 tablet Oral Daily   pantoprazole sodium  40 mg Oral BID   sodium bicarbonate  1,300 mg Oral BID   thiamine  100 mg Oral Daily   Or   thiamine  100 mg Intravenous Daily   Continuous Infusions:  sodium chloride     sodium bicarbonate 150 mEq in D5W infusion 125 mL/hr at 06/04/21 1201     LOS: 19 days        Riko Lumsden Gerome Apley, MD

## 2021-06-04 NOTE — Progress Notes (Addendum)
Nephrology Follow-Up Consult note   Assessment/Recommendations: Christopher Henry is a/an 50 y.o. male with a past medical history significant for metastatic non-small cell lung cancer, COPD, HTN, alcohol use disorder, tobacco use disorder, admitted for acute hypoxic respiratory distress, non-productive cough and hematemesis.     Non-Oliguric/Anuric AKI, stable: Cr 2.16>2.21. Likely pre-renal in the setting of dehydration/decreased PO intake. Received 500 mL NS bolus followed by NS at 100 mL for 12 hours. No urine output documented yesterday, only 240 mL intake PO. Renal U/S without hydronephrosis. Uric acid increased at 11.7, ?could possibly be related to radiation therapy. Echo on 10/15 with EF of 60-65%, patient could tolerate more fluids. -Appreciate Palliative's input- would be good to establish goals of care given his poor prognosis and weakened state.  -Continue to monitor daily Cr, Dose meds for GFR -Monitor Daily I/Os, Daily weight  -Maintain MAP>65 for optimal renal perfusion.  -Avoid nephrotoxic medications including NSAIDs and Vanc/Zosyn combo -Currently no indication for HD  Non-small cell lung cancer  Malignant Pericardial Effusion s/p pericardial window: Oncology on board, started on radiotherapy 10/19. Not a candidate for systemic therapy given continued decline. Palliative consulted to establish goals of care. Continued on colchicine. Pain management per primary.   Hyperkalemia  Metabolic acidosis: potassium increasing, 5.6>5.9. Likely in the setting of increased tissue catabolism and acidosis. Bicarb 13 with anion gap 17. Continue sodium bicarb 1300 mg BID, also now with IV sodium bicarb 150 mEq infusion. 10 g Lokelma ordered. Repeat BMP this afternoon.    Pelvic Mass: seen on CT Abd/Pelv 10/23. Highly concerning for malignancy but unclear if related to lung cancer. Surgery is recomending endoscopic ultrasound for biopsy. Oncology on board.   Leukocytosis: WBC 20.0>22.7. S/p 10 day  course of Rocephin for CAP. Afebrile.    Candida esophagitis: On fluconazole   History of HTN: now normotensive and off anti-HTN medications.    Microcytic Anemia: Hgb 7.9>7.7. S/p 5 units of transfusion thus far- most recently received 2U on 10/24. Likely multifactorial from bleeding internal hemorrhoids with prolapse, rectal mass and malignancy.  -Transfuse for Hgb<7 g/dL -No role for ESA in this setting   Alcohol Use  Tobacco Use: on Thiamine, MVI and folic acid.   Cachectic, poor PO intake. Nutrition on board- on feeding supplement. Palliative consulted.   Volume Status: Appears volume down on exam. Based on our examination and review of available imaging, our recommendation is hydration via IV and PO (as tolerated).   Sharion Settler, PGY-2 Family Medicine Resident 06/04/2021 8:04 AM  ___________________________________________________________  CC: Shortness of breath, pain  Interval History/Subjective:  Mr. Christopher Henry is complaining of pain and weakness this morning. He continues to cough and states that he coughed up something twice. RN is at bedside during encounter and patient had just had a bowel movement which was reportedly bloody. Patients words are slightly mumbled and it is difficult to comprehend him. At one point he states "I'm hallucinating".   Medications:  Current Facility-Administered Medications  Medication Dose Route Frequency Provider Last Rate Last Admin   (feeding supplement) PROSource Plus liquid 30 mL  30 mL Oral BID BM Kc, Ramesh, MD   30 mL at 06/02/21 1321   0.9 %  sodium chloride infusion  10 mL/hr Intravenous Once Coralee Pesa, MD       acetaminophen (TYLENOL) 160 MG/5ML solution 650 mg  650 mg Oral Q6H PRN Kc, Ramesh, MD       albuterol (PROVENTIL) (2.5 MG/3ML) 0.083% nebulizer solution 2.5 mg  2.5 mg Nebulization Q4H PRN Orma Flaming, MD       colchicine tablet 0.3 mg  0.3 mg Oral Daily Kc, Ramesh, MD   0.3 mg at 06/03/21 1018   docusate  sodium (COLACE) capsule 100 mg  100 mg Oral Daily PRN Winferd Humphrey, PA-C       dronabinol (MARINOL) capsule 2.5 mg  2.5 mg Oral BID AC Kc, Maren Beach, MD   2.5 mg at 06/03/21 1622   feeding supplement (ENSURE ENLIVE / ENSURE PLUS) liquid 237 mL  237 mL Oral QID Kc, Maren Beach, MD   237 mL at 06/03/21 2150   fluconazole (DIFLUCAN) tablet 200 mg  200 mg Oral Daily Kc, Maren Beach, MD   200 mg at 16/10/96 0454   folic acid (FOLVITE) tablet 1 mg  1 mg Oral Daily Hebron, Carole N, DO   1 mg at 06/03/21 1018   hydrocortisone (ANUSOL-HC) suppository 25 mg  25 mg Rectal BID Noemi Chapel P, DO   25 mg at 05/24/21 0981   hydrocortisone-pramoxine (PROCTOFOAM-HC) rectal foam 1 applicator  1 applicator Rectal BID Jackquline Denmark, MD   1 applicator at 19/14/78 2956   LORazepam (ATIVAN) injection 1 mg  1 mg Intravenous Once Shawna Clamp, MD       melatonin tablet 5 mg  5 mg Oral QHS PRN Shela Leff, MD   5 mg at 05/15/2021 2215   mometasone-formoterol (DULERA) 200-5 MCG/ACT inhaler 2 puff  2 puff Inhalation BID Orma Flaming, MD   2 puff at 06/03/21 2019   And   umeclidinium bromide (INCRUSE ELLIPTA) 62.5 MCG/INH 1 puff  1 puff Inhalation Daily Orma Flaming, MD   1 puff at 06/02/21 0813   morphine 2 MG/ML injection 2 mg  2 mg Intravenous Q2H PRN Barrett, Erin R, PA-C   2 mg at 06/01/21 2130   multivitamin with minerals tablet 1 tablet  1 tablet Oral Daily Irene Pap N, DO   1 tablet at 06/03/21 1018   pantoprazole sodium (PROTONIX) 40 mg/20 mL oral suspension 40 mg  40 mg Oral BID Irene Pap N, DO   40 mg at 06/03/21 2148   polyethylene glycol (MIRALAX / GLYCOLAX) packet 17 g  17 g Oral Daily PRN Richard Miu H, PA-C       sodium bicarbonate 150 mEq in dextrose 5 % 1,150 mL infusion   Intravenous Continuous Reesa Chew, MD       sodium bicarbonate tablet 1,300 mg  1,300 mg Oral BID Reesa Chew, MD   1,300 mg at 06/03/21 2145   sodium zirconium cyclosilicate (LOKELMA) packet 10 g  10 g Oral Once  Reesa Chew, MD       thiamine tablet 100 mg  100 mg Oral Daily Irene Pap N, DO   100 mg at 06/03/21 1018   Or   thiamine (B-1) injection 100 mg  100 mg Intravenous Daily Irene Pap N, DO   100 mg at 05/30/21 1117   traMADol (ULTRAM) tablet 50 mg  50 mg Oral Q6H PRN Shawna Clamp, MD   50 mg at 06/02/21 8657   witch hazel-glycerin (TUCKS) pad   Topical PRN Winferd Humphrey, PA-C          Review of Systems: 10 systems reviewed and negative except per interval history/subjective  Physical Exam: Vitals:   06/03/21 2327 06/04/21 0334  BP: 107/79 108/85  Pulse: 88 100  Resp: 16 16  Temp: 97.6 F (36.4 C) 97.9 F (36.6  C)  SpO2: 99% 100%   No intake/output data recorded.  Intake/Output Summary (Last 24 hours) at 06/04/2021 0804 Last data filed at 06/04/2021 0100 Gross per 24 hour  Intake 1081 ml  Output --  Net 1081 ml   Constitutional: cachectic, temporal wasting, chronically ill appearing, speech is mumbled, hard to comprehend ENMT: Dry mucous membranes with chapped lips CV: normal rate, no edema Respiratory: clear to auscultation in all lung fields, tachypnic    Test Results I personally reviewed new and old clinical labs and radiology tests Lab Results  Component Value Date   NA 136 06/04/2021   K 5.9 (H) 06/04/2021   CL 106 06/04/2021   CO2 13 (L) 06/04/2021   BUN 65 (H) 06/04/2021   CREATININE 2.21 (H) 06/04/2021   CALCIUM 8.5 (L) 06/04/2021   ALBUMIN 2.7 (L) 06/02/2021   PHOS 7.1 (H) 06/04/2021

## 2021-06-05 ENCOUNTER — Ambulatory Visit
Admit: 2021-06-05 | Discharge: 2021-06-05 | Disposition: A | Discharge status: Home / Self Care | Payer: 59 | Attending: Radiation Oncology | Admitting: Radiation Oncology

## 2021-06-05 DIAGNOSIS — R0602 Shortness of breath: Secondary | ICD-10-CM

## 2021-06-05 DIAGNOSIS — G893 Neoplasm related pain (acute) (chronic): Secondary | ICD-10-CM

## 2021-06-05 DIAGNOSIS — E43 Unspecified severe protein-calorie malnutrition: Secondary | ICD-10-CM

## 2021-06-05 LAB — RENAL FUNCTION PANEL
Albumin: 2.7 g/dL — ABNORMAL LOW (ref 3.5–5.0)
Anion gap: 22 — ABNORMAL HIGH (ref 5–15)
BUN: 80 mg/dL — ABNORMAL HIGH (ref 6–20)
CO2: 19 mmol/L — ABNORMAL LOW (ref 22–32)
Calcium: 8.2 mg/dL — ABNORMAL LOW (ref 8.9–10.3)
Chloride: 96 mmol/L — ABNORMAL LOW (ref 98–111)
Creatinine, Ser: 2.19 mg/dL — ABNORMAL HIGH (ref 0.61–1.24)
GFR, Estimated: 36 mL/min — ABNORMAL LOW (ref >60)
Glucose, Bld: 119 mg/dL — ABNORMAL HIGH (ref 70–99)
Phosphorus: 5.7 mg/dL — ABNORMAL HIGH (ref 2.5–4.6)
Potassium: 5 mmol/L (ref 3.5–5.1)
Sodium: 137 mmol/L (ref 135–145)

## 2021-06-05 LAB — BASIC METABOLIC PANEL
Anion gap: 20 — ABNORMAL HIGH (ref 5–15)
BUN: 78 mg/dL — ABNORMAL HIGH (ref 6–20)
CO2: 16 mmol/L — ABNORMAL LOW (ref 22–32)
Calcium: 8.5 mg/dL — ABNORMAL LOW (ref 8.9–10.3)
Chloride: 102 mmol/L (ref 98–111)
Creatinine, Ser: 2.25 mg/dL — ABNORMAL HIGH (ref 0.61–1.24)
GFR, Estimated: 35 mL/min — ABNORMAL LOW (ref >60)
Glucose, Bld: 101 mg/dL — ABNORMAL HIGH (ref 70–99)
Potassium: 5.9 mmol/L — ABNORMAL HIGH (ref 3.5–5.1)
Sodium: 138 mmol/L (ref 135–145)

## 2021-06-05 LAB — URIC ACID: Uric Acid, Serum: 14.6 mg/dL — ABNORMAL HIGH (ref 3.7–8.6)

## 2021-06-05 LAB — LACTIC ACID, PLASMA: Lactic Acid, Venous: >9 mmol/L (ref 0.5–1.9)

## 2021-06-05 MED ORDER — SODIUM ZIRCONIUM CYCLOSILICATE 10 G PO PACK
10.0000 g | PACK | Freq: Three times a day (TID) | ORAL | Status: AC
  Administered 2021-06-05 (×3): 10 g via ORAL
  Filled 2021-06-05 (×2): qty 1

## 2021-06-05 MED ORDER — ALLOPURINOL 100 MG PO TABS
100.0000 mg | ORAL_TABLET | Freq: Every day | ORAL | Status: DC
  Administered 2021-06-05: 100 mg via ORAL
  Filled 2021-06-05 (×2): qty 1

## 2021-06-05 MED ORDER — LORAZEPAM 1 MG PO TABS: 1.0000 mg | ORAL_TABLET | Freq: Three times a day (TID) | ORAL | Status: DC | PRN

## 2021-06-05 NOTE — Progress Notes (Signed)
PROGRESS NOTE    Eddy Liszewski  DEY:[id number] DOB: May 12, 1971 DOA: 05/18/2021 PCP: Bary Castilla, NP    Brief Narrative:  Mr. Aguilar was admitted to the hospital with the working diagnosis of community acquired pneumonia, complicated with acute hypoxemic respiratory failure and sepsis.    50 year old male past medical history for hypertension and anxiety who presented with dyspnea and hypoxemia.  At home he had progressive dyspnea and generalized weakness.  Associated with cough and burning sensation across her chest.  He was seen at his pulmonologist office his oxygenation was 80%, he was placed on submental oxygen and transferred to the hospital for further evaluation.  On his initial physical examination he was afebrile, blood pressure 108/76, heart rate 134, respiratory 23, oxygen saturation 99% on 2 L of supplemental oxygen per nasal cannula.  His lungs had decreased breath sounds in the right side, no wheezing, heart S1-S2, present, rhythmic, abdomen soft, mild tender to palpation in the epigastric area, no lower extremity edema.   Sodium 133, potassium 3.9 chloride 101, bicarb 20, glucose 147, BUN 29, creatinine 1.6, white count 30.8, hemoglobin 6.9, hematocrit 21.6, platelets 534. SARS COVID-19 negative.   Urinalysis specific gravity 1.029, 100 protein.  Negative nitrates.   Chest radiograph with opacity of the right lower lobe.   Further work-up with CT chest which was negative for pulmonary embolism.  Mild narrowing with no significant stenosis of the right main pulmonary artery due to external mass affect.  Infiltrative mass of the mediastinum with marked limited evaluation.  Complete collapse of the right lower lobe associated with complete right lower lobe bronchi occlusion due to endobronchial debris/lesion.  Questionable mass in the carina.  Positive mediastinal lymphadenopathy.  1.7 cm groundglass airspace opacity right apex.   10/15 patient underwent bronchoscopy which  showed large subcarinal mass indenting the carina and bulging bilaterally with large mainstem lesion obscuring 50% of the lumen. Patient was diagnosed with large pericardial effusion.  Echocardiography, CT surgery was consulted and patient underwent pericardial window on 05/31/2021, for malignant pericardial effusion.   On 10/18 patient underwent EGD/colonoscopy, CT of the abdomen pelvis with contrast for staging which showed a large mass within the lower pelvis and caseating distal rectum and herniating through the annulus.   Patient was diagnosed with metastatic non-small cell lung cancer. Patient was placed started on radiation therapy for his lung mass.   Possible discharge to residential hospice, not candidate for systemic treatment this time.     Assessment & Plan:   Principal Problem:   Non-small cell lung cancer with metastasis ([place]) Active Problems:   Acute hypoxemic respiratory failure ([name])   Symptomatic anemia   Mediastinal mass   AKI (acute kidney injury) ([place])   Gastrointestinal hemorrhage   Sepsis due to pneumonia ([place])   COPD (chronic obstructive pulmonary disease) ([name])   Acute blood loss anemia   Malignant pericardial effusion   Protein-calorie malnutrition, severe   Perianal mass   Non small cell cancer with metastasis, right lower lung collapse (post obstructive pneumonia with sepsis). Pericardial malignant effusion. Pelvic mass. Hyperuricemia.  Acute hypoxemic respiratory failure.  Patient continue to be very weak and deconditioned, continue to have positive dyspnea and chest pain with minimal efforts.   Not candidate for systemic cancer therapy. He is getting palliative radiation therapy. Now sp pericardial window.   Uric acid was 11,7 on 06/03/21, follow up uric acid today.  Add allopurinol and continue with colchicine.    Continue with palliative radiation therapy.  Continue with  oxymetry monitoring and supplemental 02 to keep 02 saturation 92% or  greater. Completed antibiotic therapy for pneumonia.  He has a very poor prognosis and life expectancy likely less than 2 weeks.    2. COPD No signs of clinical exacerbation.,    3. Anemia of chronic disease, related to malignancy thrombocytosis, sp multiple PRBC transfusion during this hospitalization.  Positive candida esophagitis.  On fluconazole.    4. AKI, hyperkalemia, positive anion gap metabolic acidosis  Patient with very poor oral intake, very weak and deconditioned.  His renal function today has a serum cr of 2,25, K 5,9 and serum bicarbonate at 16.  Suspected anion gap metabolic acidosis due to renal failure, but also malignancy can trigger lactic acidosis.  Plan to continue IV bicarb drip for now Check lactic acid. Continue with sodium zirconium for hyperkalemia, (3 more doses) Follow up renal function and electrolytes in am. Patient with very poor prognosis due to advance malignancy.       5. History of alcohol abuse, severe protein calorie malnutrition. On nutritional supplements and appetite stimulants.  No signs of alcohol withdrawal.  Continue with multivitamins and thiamine.    Patient continue to be at high risk for worsening malignancy, renal failure and hyperkalemia with acidosis.   Status is: Inpatient  Remains inpatient appropriate because: terminal cancer,    DVT prophylaxis:  Scd   Code Status:    DNR   Family Communication:   I spoke with patient's fiance at the bedside, we talked in detail about patient's condition, plan of care and prognosis and all questions were addressed.      Nutrition Status: Nutrition Problem: Severe Malnutrition Etiology: acute illness (newly diagnosed lung cancer) Signs/Symptoms: severe muscle depletion, severe fat depletion, percent weight loss (6% weight loss in one month) Percent weight loss: 6 % Interventions: MVI, Magic cup, Ensure Enlive (each supplement provides 350kcal and 20 grams of protein)     Consultants:  Oncology Pulmonary  CT surgery  Radiation oncology   Procedures:  Pericardial window Bronchoscopy      Subjective: Patient with no nausea or vomiting, continue to have chest pain and dyspnea with minimal movement, decreased po intake.   Objective: Vitals:   06/04/21 2356 06/05/21 0615 06/05/21 0748 06/05/21 0837  BP: 118/82 103/82 110/86   Pulse: (!) 105 62 64 68  Resp: 15  16 20   Temp: (!) 97.5 F (36.4 C) 98.4 F (36.9 C) 98 F (36.7 C)   TempSrc: Axillary Axillary Axillary   SpO2: 98% 100% 99% 96%  Weight:  58.2 kg    Height:        Intake/Output Summary (Last 24 hours) at 06/05/2021 0943 Last data filed at 06/05/2021 0244 Gross per 24 hour  Intake 1233.75 ml  Output --  Net 1233.75 ml   Filed Weights   05/29/21 0243 06/04/21 0334 06/05/21 0615  Weight: 61.3 kg 58.6 kg 58.2 kg    Examination:   General: deconditioned and ill looking appearing  Neurology: Awake and alert, non focal  E ENT: positive pallor, no icterus, oral mucosa moist Cardiovascular: No JVD. S1-S2 present, rhythmic, no gallops, rubs, or murmurs. No lower extremity edema. Pulmonary: positive breath sounds bilaterally, decreased inspiratory effort with decreased breath sounds at the right base, with no wheezing, rhonchi or rales. Gastrointestinal. Abdomen soft and non tender Skin. No rashes Musculoskeletal: no joint deformities     Data Reviewed: I have personally reviewed following labs and imaging studies  CBC: Recent Labs  Lab  05/31/21 0048 06/01/21 0436 06/02/21 0128 06/03/21 0213 06/04/21 0240  WBC 20.6* 22.4* 21.6* 20.0* 22.7*  HGB 8.7* 8.9* 8.7* 7.9* 7.7*  HCT 27.9* 28.1* 27.4* 25.0* 25.2*  MCV 81.3 80.5 79.4* 79.9* 81.6  PLT 520* 487* 490* 421* 865   Basic Metabolic Panel: Recent Labs  Lab 05/31/21 0048 06/01/21 0436 06/02/21 0128 06/02/21 0907 06/02/21 1612 06/03/21 0213 06/04/21 0240 06/04/21 1334 06/05/21 0138  NA 137 137 136  --   --   137 136 138 138  K 4.3 5.1 5.4*   < > 5.2* 5.6* 5.9* 6.1* 5.9*  CL 105 105 104  --   --  106 106 105 102  CO2 23 17* 19*  --   --  18* 13* 15* 16*  GLUCOSE 107* 105* 127*  --   --  122* 102* 119* 101*  BUN 22* 25* 38*  --   --  55* 65* 70* 78*  CREATININE 1.25* 1.35* 1.78*  --   --  2.16* 2.21* 2.30* 2.25*  CALCIUM 9.0 9.1 9.0  --   --  9.0 8.5* 8.8* 8.5*  MG 2.2 2.2 2.4  --   --  2.5* 2.6*  --   --   PHOS 4.3 5.1* 6.4*  --   --  7.3* 7.1*  --   --    < > = values in this interval not displayed.   GFR: Estimated Creatinine Clearance: 32.3 mL/min (A) (by C-G formula based on SCr of 2.25 mg/dL (H)). Liver Function Tests: Recent Labs  Lab 06/02/21 0128  AST 65*  ALT 189*  ALKPHOS 171*  BILITOT 1.0  PROT 6.8  ALBUMIN 2.7*   No results for input(s): LIPASE, AMYLASE in the last 168 hours. No results for input(s): AMMONIA in the last 168 hours. Coagulation Profile: No results for input(s): INR, PROTIME in the last 168 hours. Cardiac Enzymes: No results for input(s): CKTOTAL, CKMB, CKMBINDEX, TROPONINI in the last 168 hours. BNP (last 3 results) No results for input(s): PROBNP in the last 8760 hours. HbA1C: No results for input(s): HGBA1C in the last 72 hours. CBG: No results for input(s): GLUCAP in the last 168 hours. Lipid Profile: No results for input(s): CHOL, HDL, LDLCALC, TRIG, CHOLHDL, LDLDIRECT in the last 72 hours. Thyroid Function Tests: No results for input(s): TSH, T4TOTAL, FREET4, T3FREE, THYROIDAB in the last 72 hours. Anemia Panel: No results for input(s): VITAMINB12, FOLATE, FERRITIN, TIBC, IRON, RETICCTPCT in the last 72 hours.    Radiology Studies: I have reviewed all of the imaging during this hospital visit personally     Scheduled Meds:  (feeding supplement) PROSource Plus  30 mL Oral BID BM   colchicine  0.3 mg Oral Daily   dronabinol  2.5 mg Oral BID AC   feeding supplement  237 mL Oral QID   fluconazole  200 mg Oral Daily   folic acid  1 mg  Oral Daily   hydrocortisone  25 mg Rectal BID   hydrocortisone-pramoxine  1 applicator Rectal BID   LORazepam  1 mg Intravenous Once   mometasone-formoterol  2 puff Inhalation BID   And   umeclidinium bromide  1 puff Inhalation Daily   multivitamin with minerals  1 tablet Oral Daily   pantoprazole sodium  40 mg Oral BID   sodium zirconium cyclosilicate  10 g Oral TID   thiamine  100 mg Oral Daily   Continuous Infusions:  sodium chloride     sodium bicarbonate 150 mEq in  D5W infusion 125 mL/hr at 06/05/21 0246     LOS: 20 days        Neytiri Asche Gerome Apley, MD

## 2021-06-05 NOTE — Progress Notes (Signed)
Nephrology Follow-Up Consult note   Assessment/Recommendations: Christopher Henry is a/an 50 y.o. male with a past medical history significant for metastatic non-small cell lung cancer, COPD, HTN, alcohol use disorder, tobacco use disorder, admitted for acute hypoxic respiratory distress, non-productive cough and hematemesis.     Non-Oliguric AKI (stable): Creatinine 2.3>2.25. Likely secondary to decreased PO intake. Still on 150 mEq of bicarb at 150 mL/hr. Rare to see TLS in solid tumors but electrolyte imbalances with hyperkalemia, hypocalcemia, hyerphosphatemia would all be consistent with this. Did receive radiation treatment yesterday. Will continue to provide fluids. -Appreciate Palliative's input- would be good to establish goals of care given his poor prognosis and weakened state.  -Continue IV Bicarb to help hyperkalemia, fluid status and acidemia  -Continue to monitor daily Cr, Dose meds for GFR -Monitor Daily I/Os, Daily weight  -Maintain MAP>65 for optimal renal perfusion.  -Avoid nephrotoxic medications including NSAIDs and Vanc/Zosyn combo -Currently no indication for HD  Non-small cell lung cancer  Malignant Pericardial Effusion s/p pericardial window: Oncology on board, started on radiotherapy 10/19 and last sesion was yesterday 11/2. Not a candidate for systemic therapy given continued decline. Palliative consulted to establish goals of care. Continued on colchicine. Pain management per primary.   Hyperkalemia  Metabolic acidosis: potassium increased but slightly down-trending- 6.1>5.9. Likely in the setting of increased tissue catabolism and acidosis. Bicarb 16 and anion gap slightly worsened at 20. Continue sodium bicarb 1300 mg BID, also now with IV sodium bicarb 150 mEq infusion. 10 g Lokelma TID. Repeat BMP this afternoon.    Pelvic Mass: seen on CT Abd/Pelv 10/23. Highly concerning for malignancy but unclear if related to lung cancer. Surgery is recomending endoscopic ultrasound  for biopsy. Oncology on board.   Leukocytosis: Stable. S/p 10 day course of Rocephin for CAP. Afebrile.    Candida esophagitis: On fluconazole.   History of HTN: now normotensive and off anti-HTN medications.    Microcytic Anemia: Hgb 7.7. S/p 5 units of transfusion thus far- most recently received 2U on 10/24. Likely multifactorial from bleeding internal hemorrhoids with prolapse, rectal mass and malignancy.  -Transfuse for Hgb<7 g/dL -No role for ESA in this setting   Alcohol Use  Tobacco Use: on Thiamine, MVI and folic acid.   Cachectic, poor PO intake. Nutrition on board- on feeding supplement. Palliative consulted.   Volume Status: Appears volume down on exam. Based on our examination and review of available imaging, our recommendation is hydration via IV and PO (as tolerated).   Sharion Settler, PGY-2 Family Medicine Resident 06/05/2021 8:42 AM  ___________________________________________________________  CC: Weakness, cough   Interval History/Subjective: Christopher Henry continues to feel weak. He is accompanied by his fiance at the bedside who states they are getting married today. He continues to have intermittent cough and some chest wall pain. He has urinated once this morning without difficulty and his urine in urinal is at the bedside and is very dark in color. He complains of some abdominal pain but believes it to be gas pain. He notes he has had about three bowel movements in the last day.   Medications:  Current Facility-Administered Medications  Medication Dose Route Frequency Provider Last Rate Last Admin   (feeding supplement) PROSource Plus liquid 30 mL  30 mL Oral BID BM Kc, Ramesh, MD   30 mL at 06/04/21 1154   0.9 %  sodium chloride infusion  10 mL/hr Intravenous Once Coralee Pesa, MD       acetaminophen (TYLENOL) 160 MG/5ML solution  650 mg  650 mg Oral Q6H PRN Kc, Ramesh, MD       albuterol (PROVENTIL) (2.5 MG/3ML) 0.083% nebulizer solution 2.5 mg  2.5 mg  Nebulization Q4H PRN Orma Flaming, MD       colchicine tablet 0.3 mg  0.3 mg Oral Daily Kc, Ramesh, MD   0.3 mg at 06/03/21 1018   docusate sodium (COLACE) capsule 100 mg  100 mg Oral Daily PRN Winferd Humphrey, PA-C       dronabinol (MARINOL) capsule 2.5 mg  2.5 mg Oral BID AC Kc, Maren Beach, MD   2.5 mg at 06/04/21 1154   feeding supplement (ENSURE ENLIVE / ENSURE PLUS) liquid 237 mL  237 mL Oral QID Kc, Maren Beach, MD   237 mL at 06/04/21 2217   fluconazole (DIFLUCAN) tablet 200 mg  200 mg Oral Daily Kc, Ramesh, MD   200 mg at 14/43/15 4008   folic acid (FOLVITE) tablet 1 mg  1 mg Oral Daily [place], Carole N, DO   1 mg at 06/03/21 1018   hydrocortisone (ANUSOL-HC) suppository 25 mg  25 mg Rectal BID Noemi Chapel P, DO   25 mg at 05/24/21 6761   hydrocortisone-pramoxine (PROCTOFOAM-HC) rectal foam 1 applicator  1 applicator Rectal BID Jackquline Denmark, MD   1 applicator at 95/09/32 6712   LORazepam (ATIVAN) injection 1 mg  1 mg Intravenous Once Shawna Clamp, MD       melatonin tablet 5 mg  5 mg Oral QHS PRN Shela Leff, MD   5 mg at 05/29/2021 2215   mometasone-formoterol (DULERA) 200-5 MCG/ACT inhaler 2 puff  2 puff Inhalation BID Orma Flaming, MD   2 puff at 06/05/21 4580   And   umeclidinium bromide (INCRUSE ELLIPTA) 62.5 MCG/INH 1 puff  1 puff Inhalation Daily Orma Flaming, MD   1 puff at 06/02/21 0813   morphine 2 MG/ML injection 2 mg  2 mg Intravenous Q2H PRN Barrett, Erin R, PA-C   2 mg at 06/04/21 0930   multivitamin with minerals tablet 1 tablet  1 tablet Oral Daily Irene Pap N, DO   1 tablet at 06/03/21 1018   pantoprazole sodium (PROTONIX) 40 mg/20 mL oral suspension 40 mg  40 mg Oral BID Irene Pap N, DO   40 mg at 06/04/21 2217   polyethylene glycol (MIRALAX / GLYCOLAX) packet 17 g  17 g Oral Daily PRN Richard Miu H, PA-C       sodium bicarbonate 150 mEq in dextrose 5 % 1,150 mL infusion   Intravenous Continuous Reesa Chew, MD 125 mL/hr at 06/05/21 0246 New Bag at  06/05/21 0246   sodium zirconium cyclosilicate (LOKELMA) packet 10 g  10 g Oral TID Tawni Millers, MD   10 g at 06/04/21 2217   thiamine tablet 100 mg  100 mg Oral Daily Irene Pap N, DO   100 mg at 06/03/21 1018   traMADol (ULTRAM) tablet 50 mg  50 mg Oral Q6H PRN Shawna Clamp, MD   50 mg at 06/02/21 9983   witch hazel-glycerin (TUCKS) pad   Topical PRN Winferd Humphrey, PA-C          Review of Systems: 10 systems reviewed and negative except per interval history/subjective  Physical Exam: Vitals:   06/05/21 0748 06/05/21 0837  BP: 110/86   Pulse: 64 68  Resp: 16 20  Temp: 98 F (36.7 C)   SpO2: 99% 96%   No intake/output data recorded.  Intake/Output Summary (Last 24  hours) at 06/05/2021 1102 Last data filed at 06/05/2021 0244 Gross per 24 hour  Intake 1233.75 ml  Output 300 ml  Net 933.75 ml   Constitutional: cachectic, temporal wasting, chronically ill appearing, speech is mumbled, hard to comprehend ENMT: Dry mucous membranes with chapped lips CV: normal rate and rhythm, no edema Respiratory: clear to auscultation in all lung fields, intermittent dry cough GI: Soft, thin, mild tenderness to epigastric and RUQ without rebound or guarding   Test Results I personally reviewed new and old clinical labs and radiology tests Lab Results  Component Value Date   NA 138 06/05/2021   K 5.9 (H) 06/05/2021   CL 102 06/05/2021   CO2 16 (L) 06/05/2021   BUN 78 (H) 06/05/2021   CREATININE 2.25 (H) 06/05/2021   CALCIUM 8.5 (L) 06/05/2021   ALBUMIN 2.7 (L) 06/02/2021   PHOS 7.1 (H) 06/04/2021

## 2021-06-05 NOTE — Progress Notes (Signed)
This chaplain is present with the Pt., Pt. fiancee-Lisa Withers, and two witness: Michaela Corner and [place] to perform the service of marriage for the Pt. and [name].   The chaplain is appreciative of the Pt. RN-Tammy's communication and the support of the Spiritual Care Department.  The chaplain understands taking the marriage certificate to the [facility] is the chaplain's responsibility as the officiant of marriage ceremony.  This chaplain will F/U with spiritual care as needed.  Chaplain Sallyanne Kuster [phone]

## 2021-06-05 NOTE — Progress Notes (Signed)
PT TREATMENT Late entry for 06/04/21   Clinical Impression: Pt not significantly progressing with mobility, limited by poor activity tolerance secondary to medical status; despite this, pt requesting to stay on acute PT caseload with hopes for improved strength and endurance. Pt tolerating brief bouts of standing activity with min guard for balance. Educ and demonstrates manual w/c use and functions, suspect this would be a good option for longer bouts of activity; pt with increased confusion and poor teach back of w/c-use this session. Will continue to follow acutely to address established goals as pt can tolerate.   06/04/21 2000  PT Visit Information  Assistance Needed +1  Reason Eval/Treat Not Completed Fatigue/lethargy limiting ability to participate  History of Present Illness Pt is a 50 y.o. male admitted 05/17/2021 with SOB, hypoxia, unintentional weight loss, hemoptysis. Found to have pericardial effusion in setting of locally advanced metastatic non-small cell lung CA. S/p pericardial window 10/17. S/p EGD 10/18 showing prolapsed grade IV hemorrhoids. Radiation initiated 10/18. CT 10/23 showed large pelvic mass, suspect malignancy. Per oncology note, hospice/palliative involved as pt deemed not a candidate for systemic tx at this time. PMH includes tobacco use, HTN.  Subjective Data  Patient Stated Goal None stated  Precautions  Precautions Fall;Other (comment)  Precaution Comments Bladder/bowel incontinence/urgency (pt wears Depends); very poor activity tolerance  Pain Assessment  Faces Pain Scale 4  Pain Location Generalized (pt not specifying when asked)  Pain Descriptors / Indicators Grimacing;Guarding  Cognition  Arousal/Alertness Awake/alert;Lethargic  Behavior During Therapy Flat affect  Overall Cognitive Status No family/caregiver present to determine baseline cognitive functioning  General Comments Pt appears to have increased confusion this session (pt new to this PT, but RN  agrees pt more confused than normal); speech difficult to understand at times. Following simple commands with increased time, but apparent slowed processing, pt frequently requesting, "Just one more minute... just give me a minute" - appears limited by fatigue  Bed Mobility  Overal bed mobility Modified Independent  Bed Mobility Supine to Sit  Supine to sit Modified independent (Device/Increase time);HOB elevated  Sit to supine Supervision  General bed mobility comments significant increased time, taking frequent rest breaks on way to EOB, use of bed rail and HOB elevated, no physical assist but verbal cues to complete task  Transfers  Overall transfer level Needs assistance  Equipment used None  Transfers Sit to/from Stand;Stand Pivot Transfers  Sit to Stand Supervision  Stand pivot transfers Supervision  Squat pivot transfers Min guard  General transfer comment Pt preparing for pivot transfer to w/c, continuing to request more time to rest sitting EOB, but then urgency to have BM with supervision to pivot to [facility]; pt with poor awareness of IV lines  Ambulation/Gait  Ambulation/Gait assistance Min assist  Assistive device Rolling walker (2 wheels)  Gait Pattern/deviations Step-through pattern  General Gait Details  (deferred secondary to fatigue)  Gait velocity Engineer, drilling Assistance Details (indicate cue type and reason) demonstrated functions and safety with manual w/c, including locking/unlocking brakes, removing armrests, positioning for pivot transfers, use of BUEs/BLEs to propel; pt with poor teach back of w/c use; prepared for transfer, but ultimately unable to progress to practicing with w/c secondary to fatigue and need to have BM  Balance  Overall balance assessment Needs assistance  Sitting-balance support Feet supported;No upper extremity supported  Sitting balance-Leahy Scale Fair  Standing balance support Bilateral upper extremity supported;During  functional activity  Standing balance-Leahy Scale Poor  Standing balance  comment reaching to UE support to maintain balance with standing and pivot transfer  PT - End of Session  Activity Tolerance Patient limited by fatigue  Patient left Other (comment) (seated on BSC with RN present)  Nurse Communication Mobility status   PT - Assessment/Plan  PT Plan Frequency needs to be updated  PT Visit Diagnosis Other abnormalities of gait and mobility (R26.89);Muscle weakness (generalized) (M62.81);Unsteadiness on feet (R26.81)  PT Frequency (ACUTE ONLY) Min 2X/week  Follow Up Recommendations Home health PT  Assistance recommended at discharge Frequent or constant Supervision/Assistance  PT equipment Rolling walker (2 wheels);Wheelchair (measurements PT);Wheelchair cushion (measurements PT);3in1 (PT)  AM-PAC PT "6 Clicks" Mobility Outcome Measure (Version 2)  Help needed turning from your back to your side while in a flat bed without using bedrails? 3  Help needed moving from lying on your back to sitting on the side of a flat bed without using bedrails? 3  Help needed moving to and from a bed to a chair (including a wheelchair)? 3  Help needed standing up from a chair using your arms (e.g., wheelchair or bedside chair)? 3  Help needed to walk in hospital room? 2  Help needed climbing 3-5 steps with a railing?  2  6 Click Score 16  Consider Recommendation of Discharge To: Home with [facility]  Acute Rehab PT Goals  PT Goal Formulation With patient/family  Time For Goal Achievement 06/07/21  Potential to Achieve Goals Fair  PT Time Calculation  PT Start Time (ACUTE ONLY) 0836  PT Stop Time (ACUTE ONLY) 0901  PT Time Calculation (min) (ACUTE ONLY) 25 min   Mabeline Caras, PT, DPT Acute Rehabilitation Services  Pager [phone] Office [phone]

## 2021-06-05 NOTE — Progress Notes (Signed)
This chaplain is present for F/U spiritual care.  Pt. fiance-Lisa has stepped away from the bedside.  The Pt. RN-Tammy is in the room preparing for medical care.  The chaplain acknowledged the Pt. relationship with [name]. Throughout the visit the Pt. is able to respond appropriately and follow the conversation.  The chaplain understands the Pt. is questioning the timing of a wedding. The chaplain will F/U with [name] and the Pt. together before continuing plans for a wedding.   The chaplain understands the Pt. is scheduled for radiation at 1:45 on the [place] today.  Chaplain Sallyanne Kuster [phone]

## 2021-06-06 ENCOUNTER — Ambulatory Visit
Admit: 2021-06-06 | Discharge: 2021-06-06 | Disposition: A | Discharge status: Home / Self Care | Payer: 59 | Attending: Radiation Oncology | Admitting: Radiation Oncology

## 2021-06-06 ENCOUNTER — Encounter: Payer: Self-pay | Admitting: Radiation Oncology

## 2021-06-06 LAB — RENAL FUNCTION PANEL
Albumin: 2.5 g/dL — ABNORMAL LOW (ref 3.5–5.0)
Anion gap: 21 — ABNORMAL HIGH (ref 5–15)
BUN: 78 mg/dL — ABNORMAL HIGH (ref 6–20)
CO2: 22 mmol/L (ref 22–32)
Calcium: 7.9 mg/dL — ABNORMAL LOW (ref 8.9–10.3)
Chloride: 94 mmol/L — ABNORMAL LOW (ref 98–111)
Creatinine, Ser: 2.18 mg/dL — ABNORMAL HIGH (ref 0.61–1.24)
GFR, Estimated: 36 mL/min — ABNORMAL LOW (ref >60)
Glucose, Bld: 124 mg/dL — ABNORMAL HIGH (ref 70–99)
Phosphorus: 5.3 mg/dL — ABNORMAL HIGH (ref 2.5–4.6)
Potassium: 4.4 mmol/L (ref 3.5–5.1)
Sodium: 137 mmol/L (ref 135–145)

## 2021-06-06 LAB — URIC ACID: Uric Acid, Serum: 14.5 mg/dL — ABNORMAL HIGH (ref 3.7–8.6)

## 2021-06-06 MED ORDER — SODIUM BICARBONATE 650 MG PO TABS
1300.0000 mg | ORAL_TABLET | Freq: Two times a day (BID) | ORAL | Status: DC
  Filled 2021-06-06: qty 2

## 2021-06-06 MED ORDER — LACTATED RINGERS IV SOLN: INTRAVENOUS | Status: DC

## 2021-06-09 ENCOUNTER — Ambulatory Visit: Payer: 59

## 2021-06-09 ENCOUNTER — Encounter (HOSPITAL_COMMUNITY): Payer: Self-pay

## 2021-06-10 ENCOUNTER — Ambulatory Visit: Payer: 59

## 2021-06-11 ENCOUNTER — Other Ambulatory Visit: Payer: 59

## 2021-06-11 ENCOUNTER — Ambulatory Visit: Payer: 59 | Admitting: Hematology

## 2021-06-11 ENCOUNTER — Ambulatory Visit: Payer: 59

## 2021-06-12 ENCOUNTER — Ambulatory Visit: Payer: 59

## 2021-06-13 ENCOUNTER — Ambulatory Visit: Payer: 59

## 2021-06-16 ENCOUNTER — Ambulatory Visit: Payer: 59

## 2021-06-17 ENCOUNTER — Ambulatory Visit: Payer: 59

## 2021-06-17 ENCOUNTER — Encounter (HOSPITAL_COMMUNITY): Payer: Self-pay

## 2021-06-18 ENCOUNTER — Ambulatory Visit: Payer: 59

## 2021-06-19 ENCOUNTER — Ambulatory Visit: Payer: 59

## 2021-06-20 ENCOUNTER — Ambulatory Visit: Payer: 59

## 2021-06-23 ENCOUNTER — Ambulatory Visit: Payer: 59

## 2021-06-24 ENCOUNTER — Ambulatory Visit: Payer: 59

## 2021-06-25 ENCOUNTER — Ambulatory Visit: Payer: 59

## 2021-06-30 ENCOUNTER — Ambulatory Visit: Payer: 59

## 2021-07-01 ENCOUNTER — Ambulatory Visit: Payer: 59

## 2021-07-02 ENCOUNTER — Ambulatory Visit: Payer: 59

## 2021-07-03 ENCOUNTER — Ambulatory Visit: Payer: 59

## 2021-07-03 NOTE — Discharge Summary (Signed)
Death Summary  Chico Cawood GNF:[id number] DOB: [birth year] DOA: May 29, 2021  PCP: Bary Castilla, NP PCP/Office notified:   Admit date: 05/29/21 Date of Death: 19-Jun-2021  Final Diagnoses:  Principal Problem:   Non-small cell lung cancer with metastasis ([place]) Active Problems:   Acute hypoxemic respiratory failure ([name])   Symptomatic anemia   Mediastinal mass   AKI (acute kidney injury) ([place])   Gastrointestinal hemorrhage   Sepsis due to pneumonia ([place])   COPD (chronic obstructive pulmonary disease) ([place])   Acute blood loss anemia   Malignant pericardial effusion   Protein-calorie malnutrition, severe   Perianal mass      History of present illness:  50 year old male with past medical history of hypertension, anxiety presented with dyspnea and hypoxemia.  At home he had progressive dyspnea and generalized weakness.  He was seen at his pulmonologist office with O2 sats 80% was placed on supplemental oxygen and transferred to hospital for further evaluation.  His lungs had decreased breath sounds on right side, no wheezing.  Chest x-ray showed opacity of right lower lobe. Further work-up with CT chest showed no pulmonary embolism, infiltrative mass of the mediastinum with marked limited evaluation.  Complete collapse on the right lower lobe with right lower lobe bronchi occlusion due to endobronchial debris/lesion, questionable mass in the carina.  Positive mediastinal lymphadenopathy. On 10/15 he underwent bronchoscopy which showed large subcarinal mass indenting the carina and bulging bilaterally with large mainstem lesion obscuring 50% of the lumen.  Patient was diagnosed with large pericardial effusion.  Echocardiography, CT surgery was consulted and patient underwent pericardial window on 05/09/2021, for malignant pericardial effusion.   On 10/18 patient underwent EGD/colonoscopy, CT of the abdomen pelvis with contrast for staging which showed a large mass within the lower  pelvis and caseating distal rectum and herniating through the annulus.   Patient was diagnosed with metastatic non-small cell lung cancer. Patient was placed started on radiation therapy for his lung mass.   Hospital Course:   Non-small cell cancer with metastasis/acute hypoxic respiratory failure -Right lung collapse, postobstructive pneumonia with sepsis -Pericardial malignant effusion -Pelvic mass -Patient not a candidate for systemic chemotherapy -He was started on palliative radiation therapy -Underwent pericardial window -Completed antibiotic therapy for pneumonia -Palliative care was consulted -Due to extreme poor prognosis plan was to transfer to residential hospice -Patient became short of breath and agitated, was given IV morphine for comfort -Patient expired today at 5:06 p.m.  COPD Anemia of chronic disease Acute kidney injury History of alcohol abuse Severe protein calorie malnutrition  Time: Of death 5:06 PM  Signed:  Oswald Hillock  [facility] 06-19-21, 7:27 PM

## 2021-07-03 NOTE — Progress Notes (Signed)
Occupational Therapy Treatment Patient Details Name: Christopher Henry MRN: [id number] DOB: 22-Aug-1970 Today's Date: June 30, 2021   History of present illness Pt is a 50 y.o. male admitted 05/24/2021 with SOB, hypoxia, unintentional weight loss, hemoptysis. Found to have pericardial effusion in setting of locally advanced metastatic non-small cell lung CA. S/p pericardial window 10/17. S/p EGD 10/18 showing prolapsed grade IV hemorrhoids. Radiation initiated 10/18. CT 10/23 showed large pelvic mass, suspect malignancy. Per oncology note, hospice/palliative involved as pt deemed not a candidate for systemic tx at this time. PMH includes tobacco use, HTN.   OT comments  Patient received in bed and was agreeable to get up to sink to perform grooming and self care tasks. Patient required frequent cues to initiate and indicated he needed time. Patient was difficult to understand this date and began getting agitated when asked to repeat himself.  Patient was able to get to eob and then asked to use BSC.  Once complete on [facility] had wanted to lay across bed for toilet hygiene and asked for assistance. Patient required assistance to sit back to eob and asked to return to supine. Patient continues to demo decreased activity tolerance and increased assistance for self care.  Acute OT to continue to follow.    Recommendations for follow up therapy are one component of a multi-disciplinary discharge planning process, led by the attending physician.  Recommendations may be updated based on patient status, additional functional criteria and insurance authorization.    Follow Up Recommendations  Home health OT    Assistance Recommended at Discharge Intermittent Supervision/Assistance  Equipment Recommendations  BSC;Other (comment)    Recommendations for Other Services      Precautions / Restrictions Precautions Precautions: Fall;Other (comment) Precaution Comments: Bladder/bowel incontinence/urgency (pt wears Depends);  very poor activity tolerance Restrictions Weight Bearing Restrictions: No       Mobility Bed Mobility Overal bed mobility: Needs Assistance Bed Mobility: Supine to Sit;Sit to Supine     Supine to sit: Supervision;Min guard;HOB elevated Sit to supine: Min assist   General bed mobility comments: patient layed across bed for toilet hygiene and required assistance to sit back on eob    Transfers Overall transfer level: Needs assistance Equipment used: 1 person hand held assist Transfers: Sit to/from [facility] Sit to Stand: Min guard Stand pivot transfers: Min guard         General transfer comment: transfer to [place] Overall balance assessment: Needs assistance Sitting-balance support: Feet supported;No upper extremity supported Sitting balance-Leahy Scale: Fair Sitting balance - Comments: able to sit on eob     Standing balance-Leahy Scale: Poor Standing balance comment: only stood for transfer to [facility]                           ADL either performed or assessed with clinical judgement   ADL Overall ADL's : Needs assistance/impaired                         Toilet Transfer: Min Art therapist Details (indicate cue type and reason): patient was unsafe and was min guard for transfer to [place] and Hygiene: Maximal assistance Toileting - Clothing Manipulation Details (indicate cue type and reason): patient layed across bed for toilet hygiene       General ADL Comments: Patient was lethargic with bouts of alertness.  Difficult to understand on this date and appeared confused  Vision       Perception     Praxis      Cognition Arousal/Alertness: Awake/alert;Lethargic Behavior During Therapy: Flat affect Overall Cognitive Status: Impaired/Different from baseline Area of Impairment: Memory;Following commands;Awareness;Problem solving                     Memory:  Decreased short-term memory Following Commands: Follows one step commands inconsistently;Follows one step commands with increased time   Awareness: Emergent Problem Solving: Decreased initiation General Comments: patient difficult to understand, easily aggitated          Exercises     Shoulder Instructions       General Comments      Pertinent Vitals/ Pain       Pain Assessment: Faces Faces Pain Scale: Hurts little more Pain Location: periarea Pain Descriptors / Indicators: Aching;Discomfort;Grimacing Pain Intervention(s): Limited activity within patient's tolerance;Monitored during session  Home Living                                          Prior Functioning/Environment              Frequency  Min 2X/week        Progress Toward Goals  OT Goals(current goals can now be found in the care plan section)  Progress towards OT goals: Not progressing toward goals - comment  Acute Rehab OT Goals OT Goal Formulation: With patient Time For Goal Achievement: 06/07/21 Potential to Achieve Goals: Fair ADL Goals Pt Will Perform Grooming: sitting;with modified independence Pt Will Perform Lower Body Bathing: with modified independence;sit to/from stand Pt Will Perform Lower Body Dressing: with modified independence;sit to/from stand;with adaptive equipment Pt Will Transfer to Toilet: with modified independence;ambulating;regular height toilet Pt Will Perform Toileting - Clothing Manipulation and hygiene: with modified independence;sitting/lateral leans  Plan Discharge plan remains appropriate;Frequency remains appropriate    Co-evaluation                 AM-PAC OT "6 Clicks" Daily Activity     Outcome Measure   Help from another person eating meals?: A Little Help from another person taking care of personal grooming?: A Little Help from another person toileting, which includes using toliet, bedpan, or urinal?: A Little Help from another  person bathing (including washing, rinsing, drying)?: A Little Help from another person to put on and taking off regular upper body clothing?: A Little Help from another person to put on and taking off regular lower body clothing?: A Little 6 Click Score: 18    End of Session Equipment Utilized During Treatment: Oxygen  OT Visit Diagnosis: Unsteadiness on feet (R26.81);Muscle weakness (generalized) (M62.81);Pain   Activity Tolerance Patient limited by fatigue   Patient Left in bed;with call bell/phone within reach;with bed alarm set   Nurse Communication Other (comment) (discussed patient's behavior during treatment and decreased activity tolerance)        Time: 8916-9450 OT Time Calculation (min): 33 min  Charges: OT General Charges $OT Visit: 1 Visit OT Treatments $Self Care/Home Management : 23-37 mins  Lodema Hong, Felt  Pager [phone] Office [place] June 24, 2021, 2:15 PM

## 2021-07-03 NOTE — Progress Notes (Signed)
  Radiation Oncology         [phone]) [phone] ________________________________  Name: Christopher Henry MRN: [id number]  Date: 06/23/2021  DOB: [birth year]  End of Treatment Note  Diagnosis:   Stage IV, NSCLC, favor adenocarcinoma of the right hilum     Indication for treatment: palliative       Radiation treatment dates:   05/18/21-06-23-2021  Site/planned dose:   The right hilar target was initially planned to treat with definitive intent, however his treatment plan was changed to a palliative course. The originally prescription was for 66 Gy in 33 fractions, however his first 6 fractions were at 2 Gy per fraction and he completed 12 Gy. When the plan was changed to palliative his treatment was for another 25 Gy at 2.5 Gy per fraction. He completed 7 of the 10 planned fractions of this dose for a total of 17.5 Gy. In total overall, he received 29.5 Gy over the entire time of treatment.  Narrative: The patient's status declined during his course of radiation, and her radiotherapy was discontinued after 13 of the planned 16 treatments.  Plan: The patient died after discontinuing radiation. ________________________________    Carola Rhine, PAC

## 2021-07-03 NOTE — Progress Notes (Signed)
Patient appears to be confused today. Refused afternoon medications. Edwena Blow, RN

## 2021-07-03 NOTE — Progress Notes (Signed)
Physical Therapy Treatment Patient Details Name: Christopher Henry MRN: [id number] DOB: [birth year] Today's Date: 06-21-21   History of Present Illness Pt is a 50 y.o. male admitted 05/22/2021 with SOB, hypoxia, unintentional weight loss, hemoptysis. Found to have pericardial effusion in setting of locally advanced metastatic non-small cell lung CA. S/p pericardial window 10/17. S/p EGD 10/18 showing prolapsed grade IV hemorrhoids. Radiation initiated 10/18. CT 10/23 showed large pelvic mass, suspect malignancy. Per oncology note, hospice/palliative involved as pt deemed not a candidate for systemic tx at this time. PMH includes tobacco use, HTN.    PT Comments    Pt continues to be limited in mobility and displaying a functional decline due to his poor activity tolerance and excessive fatigue. Pt requiring minA for transfers with UE support this date, requiring excessive amounts of time to even begin to transfer as he would get fatigued with transition supine > sit EOB and return to supine repeatedly. Educated and provided pt and wife on HEP handout to further encourage strengthening of his upper and lower extremities. Will continue to follow acutely. Current recommendations remain appropriate at this time, but if pt continues to not display much progress or productivity with PT sessions then may need to consider discharging pt from acute PT. Updated PT goals.   Recommendations for follow up therapy are one component of a multi-disciplinary discharge planning process, led by the attending physician.  Recommendations may be updated based on patient status, additional functional criteria and insurance authorization.  Follow Up Recommendations  Home health PT     Assistance Recommended at Discharge Frequent or constant Supervision/Assistance  Equipment Recommendations  Rolling walker (2 wheels);Wheelchair (measurements PT);Wheelchair cushion (measurements PT);3in1 (PT)    Recommendations for Other  Services       Precautions / Restrictions Precautions Precautions: Fall;Other (comment) Precaution Comments: Bladder/bowel incontinence/urgency (pt wears Depends); very poor activity tolerance Restrictions Weight Bearing Restrictions: No     Mobility  Bed Mobility Overal bed mobility: Needs Assistance Bed Mobility: Supine to Sit;Sit to Supine     Supine to sit: Supervision;Min assist;HOB elevated Sit to supine: Supervision   General bed mobility comments: Pt varying from transitioning supine > sit with supervision to minA as he fatigued, performed transition at least 4x as he would attempt to sit up to perform transfer then get fatigued and lay back down repeatedly.    Transfers Overall transfer level: Needs assistance Equipment used: 1 person hand held assist Transfers: Sit to/from [facility] Sit to Stand: Min assist Stand pivot transfers: Min assist         General transfer comment: Sit to stand and stand step pivot 3x with pt holding onto his wife or PT for support, minA to power up and steady as pt unsteady and weak.    Ambulation/Gait             General Gait Details: Pt deferred further than stepping to transfer between surfaces due to fatigue   Stairs             Wheelchair Mobility    Modified Rankin (Stroke Patients Only)       Balance Overall balance assessment: Needs assistance Sitting-balance support: Feet supported;No upper extremity supported Sitting balance-Leahy Scale: Fair Sitting balance - Comments: Static sitting with supervision EOB.   Standing balance support: Bilateral upper extremity supported;During functional activity Standing balance-Leahy Scale: Poor Standing balance comment: UE support on PT or wife for standing support, unsteadiness noted but no LOB.  Cognition Arousal/Alertness: Awake/alert;Lethargic Behavior During Therapy: Flat affect Overall Cognitive  Status: Impaired/Different from baseline Area of Impairment: Memory;Following commands;Awareness                     Memory: Decreased short-term memory Following Commands: Follows one step commands inconsistently;Follows one step commands with increased time   Awareness: Emergent   General Comments: Pt awake but lethargic, needing stimulation to maintain attention. Pt new to this PT but appears confused, likely not baseline, mumbling about details that were not related to tasks or situations at hand. Pt mumbles and soft spoken, difficult to understand often, but is self-aware of his speaking "[place]", as the pt described it. Pt describes him feeling like he has a "hangover".        Exercises General Exercises - Upper Extremity Shoulder Flexion: AROM;Both;Other reps (comment);Supine (x3) General Exercises - Lower Extremity Quad Sets: Strengthening;Both;5 reps;Supine Heel Slides: AROM;Both;5 reps;Supine Straight Leg Raises: AROM;Right;Other reps (comment);Supine (x3) Other Exercises Other Exercises: Modified pull ups/sit ups pulling on PT's hands to sit up from elevated HOB, 3x    General Comments General comments (skin integrity, edema, etc.): Provided HEP handout consisting of LAQ, heel slides, SLR, shoulder flexion, shoulder abduction, elbow extension/flexion      Pertinent Vitals/Pain Pain Assessment: Faces Faces Pain Scale: Hurts little more Pain Location: pericare Pain Descriptors / Indicators: Discomfort;Grimacing Pain Intervention(s): Limited activity within patient's tolerance;Monitored during session;Repositioned;Other (comment) (applied hemorrhoid products provided by wife)    Home Living                          Prior Function            PT Goals (current goals can now be found in the care plan section) Acute Rehab PT Goals Patient Stated Goal: to go to bathroom PT Goal Formulation: With patient/family Time For Goal Achievement:  06/20/21 Potential to Achieve Goals: Fair Progress towards PT goals: Not progressing toward goals - comment (limited by fatigue)    Frequency    Min 2X/week      PT Plan Current plan remains appropriate    Co-evaluation              AM-PAC PT "6 Clicks" Mobility   Outcome Measure  Help needed turning from your back to your side while in a flat bed without using bedrails?: A Little Help needed moving from lying on your back to sitting on the side of a flat bed without using bedrails?: A Little Help needed moving to and from a bed to a chair (including a wheelchair)?: A Little Help needed standing up from a chair using your arms (e.g., wheelchair or bedside chair)?: A Little Help needed to walk in hospital room?: A Lot Help needed climbing 3-5 steps with a railing? : A Lot 6 Click Score: 16    End of Session Equipment Utilized During Treatment: Oxygen Activity Tolerance: Patient limited by fatigue Patient left: in chair;with call bell/phone within reach;with family/visitor present Nurse Communication: Mobility status;Other (comment) (confusion, bloody stool) PT Visit Diagnosis: Other abnormalities of gait and mobility (R26.89);Muscle weakness (generalized) (M62.81);Unsteadiness on feet (R26.81)     Time: 6761-9509 PT Time Calculation (min) (ACUTE ONLY): 55 min  Charges:  $Therapeutic Exercise: 23-37 mins $Therapeutic Activity: 23-37 mins                     Moishe Spice, PT, DPT Acute Rehabilitation Services  Pager: [phone] Office:  [place] 07/06/21, 11:03 AM

## 2021-07-03 NOTE — Progress Notes (Signed)
OT Cancellation Note  Patient Details Name: Travoris Bushey MRN: [id number] DOB: 1970-08-20   Cancelled Treatment:    Reason Eval/Treat Not Completed: Patient at procedure or test/ unavailable (patient out for cancer treatment) Lodema Hong, [place]  Pager [phone] Office [place] 06/16/2021, 11:43 AM

## 2021-07-03 NOTE — Social Work (Signed)
CSW received consult for  residential hospice - family preference is [place]- CSW spoke with patient's spouse , confirmed preference  is [facility]- referral was made to [facility].   TOC will continue to follow and assist with discharge planning.  Thurmond Butts, MSW, LCSW Clinical Social Worker

## 2021-07-03 NOTE — Progress Notes (Addendum)
   Palliative Medicine Inpatient Follow Up Note  Consulting Provider: Kayleen Memos, DO   Reason for consult:   [place] Palliative Medicine Consult  Reason for Consult? to assist with establlishing goals of care    HPI:  Per intake H&P --> Christopher Henry, 50 y.o. male with PMH of COPD, HTN, alcohol abuse, tobacco use 1 pack/day x 35 years sent from pulmonary office to ED for acute respiratory distress hypoxic 80% on room air, nonproductive cough and hematemesis over the past 2 months, painless rectal bleeding, chronic internal hemorrhoids with prolapse, burning sensation on the chest and greater than 50 pound weight loss x12 months. Identified to have metastatic non-small cell lung cancer. Oncology is involved he receives radiation with Dr. Lisbeth Renshaw. The Oncology team is considering him for additional therapies.   Palliative care has been asked to get involved to discuss goals of care in light of this diagnosis.   Today's Discussion (2021-06-18):  *Please note that this is a verbal dictation therefore any spelling or grammatical errors are due to the "[place] [facility]" system interpretation.  Chart reviewed.  I met with Christopher Henry at bedside today. He was noted to be disoriented this afternoon. We reviewed that he had to use the restroom. He was not terrible interested in speaking to me this afternoon due to overall discomfort.  I called patients spouse, Dolores Lory we discussed Alandis's alteration in cognitive function which is not new. She expresses that this has been ongoing for quite sometime. We reviewed that we are getting to a point where hospice is becoming appropriate. Dolores Lory shares knowledge of this and expresses that Christopher Henry has been vocal about not wanting to go home. He shares willingness to go to an inpatient hospice. I asked if I could reach out to the MSW team to share this which Dolores Lory was in agreement with she shared that her preference would be [facility] in  [place].   Offered support through therapeutic listening given the difficulty of Jameis's situation.   Objective Assessment: Vital Signs Vitals:   06/18/2021 0841 06/18/21 1320  BP: 110/77 96/78  Pulse:  (!) 119  Resp:  19  Temp:  (!) 97 F (36.1 C)  SpO2:  100%    Intake/Output Summary (Last 24 hours) at 06-18-21 1536 Last data filed at 06-18-21 0830 Gross per 24 hour  Intake 3387.1 ml  Output 820 ml  Net 2567.1 ml    Last Weight  Most recent update: 06-18-2021  4:01 AM    Weight  59.1 kg (130 lb 4.7 oz)            Gen:  Frail middle aged M in NAD HEENT: moist mucous membranes CV: Irregular rate PULM: On 2LPM Benjamin Perez ABD: soft/nontender EXT: No edema Neuro: Alert and oriented x1-2 - disoriented at times  SUMMARY OF RECOMMENDATIONS   DNAR/DNI  Appreciate TOC team with referral to [facility]   Ongoing PMT support  Time Spent: 35 Greater than 50% of the time was spent in counseling and coordination of care ______________________________________________________________________________________ [place] Team Team Cell Phone: [phone] Please utilize secure chat with additional questions, if there is no response within 30 minutes please call the above phone number  Palliative Medicine Team providers are available by phone from 7am to 7pm daily and can be reached through the team cell phone.  Should this patient require assistance outside of these hours, please call the patient's attending physician.

## 2021-07-03 NOTE — Progress Notes (Signed)
Death Summary  Christopher Henry EYC:[id number] DOB: April 26, 1971 DOA: 2021-06-09  PCP: Bary Castilla, NP PCP/Office notified:   Admit date: 06/09/2021 Date of Death: 06-30-2021  Final Diagnoses:  Principal Problem:   Non-small cell lung cancer with metastasis ([place]) Active Problems:   Acute hypoxemic respiratory failure ([name])   Symptomatic anemia   Mediastinal mass   AKI (acute kidney injury) ([place])   Gastrointestinal hemorrhage   Sepsis due to pneumonia ([place])   COPD (chronic obstructive pulmonary disease) ([place])   Acute blood loss anemia   Malignant pericardial effusion   Protein-calorie malnutrition, severe   Perianal mass      History of present illness:  50 year old male with past medical history of hypertension, anxiety presented with dyspnea and hypoxemia.  At home he had progressive dyspnea and generalized weakness.  He was seen at his pulmonologist office with O2 sats 80% was placed on supplemental oxygen and transferred to hospital for further evaluation.  His lungs had decreased breath sounds on right side, no wheezing.  Chest x-ray showed opacity of right lower lobe. Further work-up with CT chest showed no pulmonary embolism, infiltrative mass of the mediastinum with marked limited evaluation.  Complete collapse on the right lower lobe with right lower lobe bronchi occlusion due to endobronchial debris/lesion, questionable mass in the carina.  Positive mediastinal lymphadenopathy. On 10/15 he underwent bronchoscopy which showed large subcarinal mass indenting the carina and bulging bilaterally with large mainstem lesion obscuring 50% of the lumen.  Patient was diagnosed with large pericardial effusion.  Echocardiography, CT surgery was consulted and patient underwent pericardial window on 05/15/2021, for malignant pericardial effusion.   On 10/18 patient underwent EGD/colonoscopy, CT of the abdomen pelvis with contrast for staging which showed a large mass within the lower  pelvis and caseating distal rectum and herniating through the annulus.   Patient was diagnosed with metastatic non-small cell lung cancer. Patient was placed started on radiation therapy for his lung mass.   Hospital Course:   Non-small cell cancer with metastasis/acute hypoxic respiratory failure -Right lung collapse, postobstructive pneumonia with sepsis -Pericardial malignant effusion -Pelvic mass -Patient not a candidate for systemic chemotherapy -He was started on palliative radiation therapy -Underwent pericardial window -Completed antibiotic therapy for pneumonia -Palliative care was consulted -Due to extreme poor prognosis plan was to transfer to residential hospice -Patient became short of breath and agitated, was given IV morphine for comfort -Patient expired today at 5:06 p.m.  COPD Anemia of chronic disease Acute kidney injury History of alcohol abuse Severe protein calorie malnutrition  Time: Of death 5:06 PM  Signed:  Oswald Hillock  [facility] Jun 30, 2021, 7:19 PM

## 2021-07-03 NOTE — Progress Notes (Signed)
Patient refused morning medications. MD notified. Patient educated and resting comfortably.  Edwena Blow, RN

## 2021-07-03 NOTE — Significant Event (Signed)
Time of death 5:06. Dual nurse verification by [place] RN and Linton Flemings RN. No breath sounds or heart sounds auscultated. Staff at bedside at time of death. Wife called and at bedside currently.  RN noticed patient appearing short of breath and agitated. RN called rapid RN and hospice RN who advised to give IV morphine. IV morphine administered and patient appeared to be more comfortable. Patient's wife was called. Patient passed peacefully a few moments later with staff and hospice RN at bedside.  Edwena Blow, RN

## 2021-07-03 NOTE — Progress Notes (Signed)
This chaplain attempted F/U spiritual care with the Pt. and Pt. wife-Lisa.  [name] is not at the bedside. The Pt. is awake and attempted to engage in conversation before asking the chaplain to step away.  This chaplain is available for F/U spiritual care as needed.  Chaplain Sallyanne Kuster [phone]

## 2021-07-03 NOTE — Progress Notes (Signed)
Manufacturing engineer [facility]) Hospital Liaison note.    Received request from [place] for family interest in [facility]. Chart and pt information under review by [facility] physician.  Hospice eligibility pending at this time.  [facility] is unable to offer a room today. Hospital Liaison will follow up tomorrow or sooner if a room becomes available. Please do not hesitate to call with questions.    Thank you for the opportunity to participate in this patient's care.  Domenic Moras, BSN, RN [facility] Liaison (listed on [place] under Hospice[facility])    [phone] (24h on call)

## 2021-07-03 NOTE — Progress Notes (Signed)
Nephrology Follow-Up Consult note   Assessment/Recommendations: Christopher Henry is a/an 50 y.o. male with a past medical history significant for metastatic non-small cell lung cancer, COPD, HTN, alcohol use disorder, tobacco use disorder, admitted for acute hypoxic respiratory distress, non-productive cough and hematemesis.      Non-Oliguric AKI (stable): Creatinine 2.25>2.18. Likely secondary to decreased PO intake. Potassium and bicarb now within normal limits. Switch to LR at maintenance rate. Elevated but stable anion gap in the setting of renal dysfunction and lactic acidosis (>9).  -Appreciate Palliative's assistance; no plans for comfort care as of yet -D/C bicarb infusion. Switch to LR at 100 mL/hr -Continue PO Sodium bicarb 1300 mg BID  -Continue to monitor daily Cr, Dose meds for GFR -Monitor Daily I/Os, Daily weight  -Maintain MAP>65 for optimal renal perfusion.  -Avoid nephrotoxic medications including NSAIDs a -Currently no indication for HD   Non-small cell lung cancer  Malignant Pericardial Effusion s/p pericardial window: Oncology on board, started on radiotherapy 10/19 and last sesion was yesterday 11/2. Not a candidate for systemic therapy given continued decline. Palliative following, appreciate assistance in helping to establish goals of care. Pain management per primary.  Hyperuricemia: Uric acid 14.5, slightly elevated from 11.7 three days ago. Now on colchicine and allopurinol.   Pelvic Mass: seen on CT Abd/Pelv 10/23. Highly concerning for malignancy but unclear if related to lung cancer. Surgery is recomending endoscopic ultrasound for biopsy but patient is a poor candidate and likely would not tolerate.    Candida esophagitis: On fluconazole.   History of HTN: now normotensive and off anti-HTN medications.    Microcytic Anemia: S/p 5 units of transfusion thus far- most recently received 2U on 10/24. Likely multifactorial from bleeding internal hemorrhoids with  prolapse, rectal mass and malignancy.  -Transfuse for Hgb<7 g/dL -No role for ESA in this setting   Alcohol Use  Tobacco Use: on Thiamine, MVI and folic acid.   Cachectic, poor PO intake. Nutrition on board- on feeding supplement. Palliative on board.  Goals of Care: Appreciate Palliative's assistance in helping to establish goals of care. Continue to treat for now as patient is taking it one day at a time and no plans for comfort care have been made.    Volume Status: Appears volume down on exam. Based on our examination and review of available imaging, our recommendation is hydration via IV and PO (as tolerated).   Sharion Settler, PGY-2 Family Medicine Resident  06-20-2021 8:03 AM  ___________________________________________________________   Interval History/Subjective:  Christopher Henry is very tired this morning. He is not very conversant but nods his head when asked if he got married yesterday.   Medications:  Current Facility-Administered Medications  Medication Dose Route Frequency Provider Last Rate Last Admin   (feeding supplement) PROSource Plus liquid 30 mL  30 mL Oral BID BM Kc, Ramesh, MD   30 mL at 06/05/21 0956   0.9 %  sodium chloride infusion  10 mL/hr Intravenous Once Coralee Pesa, MD       acetaminophen (TYLENOL) 160 MG/5ML solution 650 mg  650 mg Oral Q6H PRN Kc, Ramesh, MD       albuterol (PROVENTIL) (2.5 MG/3ML) 0.083% nebulizer solution 2.5 mg  2.5 mg Nebulization Q4H PRN Orma Flaming, MD       allopurinol (ZYLOPRIM) tablet 100 mg  100 mg Oral Daily Tawni Millers, MD   100 mg at 06/05/21 1156   colchicine tablet 0.3 mg  0.3 mg Oral Daily Antonieta Pert, MD  0.3 mg at 06/05/21 0957   docusate sodium (COLACE) capsule 100 mg  100 mg Oral Daily PRN Winferd Humphrey, PA-C       dronabinol (MARINOL) capsule 2.5 mg  2.5 mg Oral BID AC Kc, Maren Beach, MD   2.5 mg at 06/05/21 1806   feeding supplement (ENSURE ENLIVE / ENSURE PLUS) liquid 237 mL  237 mL Oral  QID Kc, Maren Beach, MD   237 mL at 06/05/21 2030   fluconazole (DIFLUCAN) tablet 200 mg  200 mg Oral Daily Kc, Ramesh, MD   200 mg at 16/10/96 0454   folic acid (FOLVITE) tablet 1 mg  1 mg Oral Daily Irene Pap N, DO   1 mg at 06/05/21 0981   hydrocortisone (ANUSOL-HC) suppository 25 mg  25 mg Rectal BID Noemi Chapel P, DO   25 mg at 05/24/21 1914   hydrocortisone-pramoxine (PROCTOFOAM-HC) rectal foam 1 applicator  1 applicator Rectal BID Jackquline Denmark, MD   1 applicator at 78/29/56 2130   lactated ringers infusion   Intravenous Continuous Reesa Chew, MD       LORazepam (ATIVAN) injection 1 mg  1 mg Intravenous Once Shawna Clamp, MD       LORazepam (ATIVAN) tablet 1 mg  1 mg Oral TID PRN Knox Royalty, NP       melatonin tablet 5 mg  5 mg Oral QHS PRN Shela Leff, MD   5 mg at 05/29/2021 2215   mometasone-formoterol (DULERA) 200-5 MCG/ACT inhaler 2 puff  2 puff Inhalation BID Orma Flaming, MD   2 puff at 06-24-2021 0750   And   umeclidinium bromide (INCRUSE ELLIPTA) 62.5 MCG/INH 1 puff  1 puff Inhalation Daily Orma Flaming, MD   1 puff at 06/24/2021 0750   morphine 2 MG/ML injection 2 mg  2 mg Intravenous Q2H PRN Barrett, Erin R, PA-C   2 mg at 06/04/21 0930   multivitamin with minerals tablet 1 tablet  1 tablet Oral Daily Irene Pap N, DO   1 tablet at 06/03/21 1018   pantoprazole sodium (PROTONIX) 40 mg/20 mL oral suspension 40 mg  40 mg Oral BID Irene Pap N, DO   40 mg at 06/05/21 2120   polyethylene glycol (MIRALAX / GLYCOLAX) packet 17 g  17 g Oral Daily PRN Richard Miu H, PA-C       sodium bicarbonate tablet 1,300 mg  1,300 mg Oral BID Reesa Chew, MD       thiamine tablet 100 mg  100 mg Oral Daily Irene Pap N, DO   100 mg at 06/05/21 8657   traMADol (ULTRAM) tablet 50 mg  50 mg Oral Q6H PRN Shawna Clamp, MD   50 mg at 06/02/21 8469   witch hazel-glycerin (TUCKS) pad   Topical PRN Winferd Humphrey, PA-C          Review of Systems: Unable to perform 2/2  patient somnolence   Physical Exam: Vitals:   06-24-21 0300 06/24/2021 0750  BP: 111/84   Pulse: 71   Resp: 19   Temp: (!) 97.2 F (36.2 C)   SpO2: 95% 96%   No intake/output data recorded.  Intake/Output Summary (Last 24 hours) at 2021-06-24 0803 Last data filed at 06/24/21 0659 Gross per 24 hour  Intake 3507.1 ml  Output 1020 ml  Net 2487.1 ml   Constitutional: cachectic, temporal wasting, chronically ill appearing, somnolent, minimally conversant  ENMT: Dry mucous membranes with chapped lips CV: normal rate and rhythm, no edema Respiratory:  clear to auscultation in all lung fields, normal work of breathing GI: Soft, thin, normoactive bowel sounds  Test Results I personally reviewed new and old clinical labs and radiology tests Lab Results  Component Value Date   NA 137 2021/06/08   K 4.4 06/08/21   CL 94 (L) 08-Jun-2021   CO2 22 Jun 08, 2021   BUN 78 (H) 2021/06/08   CREATININE 2.18 (H) Jun 08, 2021   CALCIUM 7.9 (L) 2021/06/08   ALBUMIN 2.5 (L) 2021/06/08   PHOS 5.3 (H) 06-08-2021

## 2021-07-03 DEATH — deceased

## 2021-07-04 ENCOUNTER — Ambulatory Visit: Payer: 59 | Admitting: Radiation Oncology

## 2021-07-07 ENCOUNTER — Ambulatory Visit: Payer: 59

## 2021-07-08 ENCOUNTER — Ambulatory Visit: Payer: 59

## 2023-03-02 IMAGING — MR MR PELVIS WO/W CM
4 of 9 series · 18 of 48 positions shown · IV contrast (gadavist)
Comparison: Comparison made with CT of the abdomen and pelvis from
May 25, 2021.

CLINICAL DATA: A 50-year-old male presents for evaluation of
anal/perianal mass.

EXAM:
MRI PELVIS WITHOUT AND WITH CONTRAST
TECHNIQUE: Multiplanar multisequence MR imaging of the pelvis was performed
both before and after administration of intravenous contrast.
CONTRAST:  6mL GADAVIST GADOBUTROL 1 MMOL/ML IV SOLN

[Series 3: T2 · sagittal · 2.5mm · 0.53mm/px · 6 of 70 slices shown (1 of 3)]
[im 1/70]
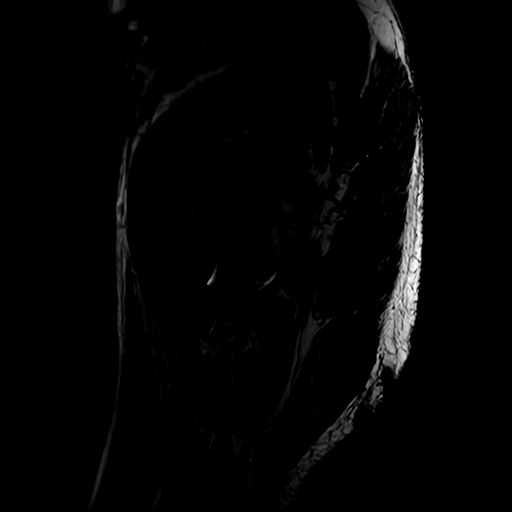
[im 14/70]
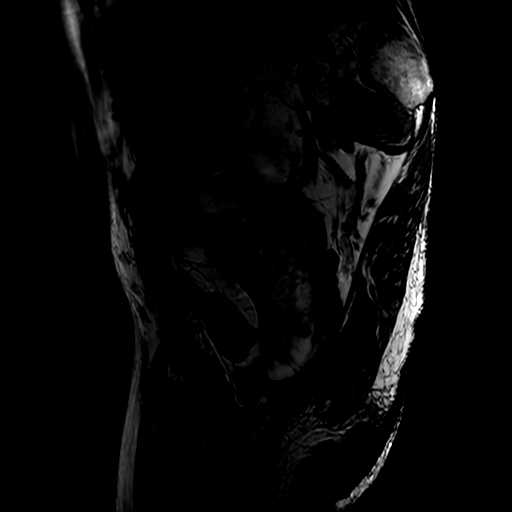
[im 28/70]
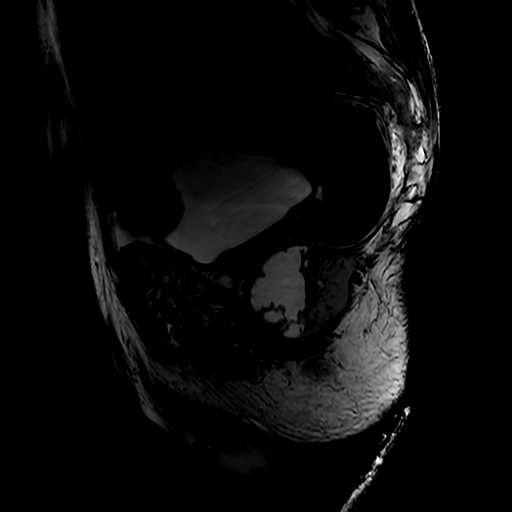
[im 42/70]
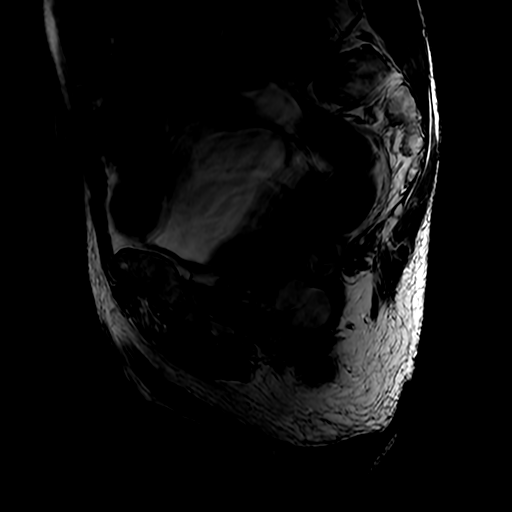
[im 56/70]
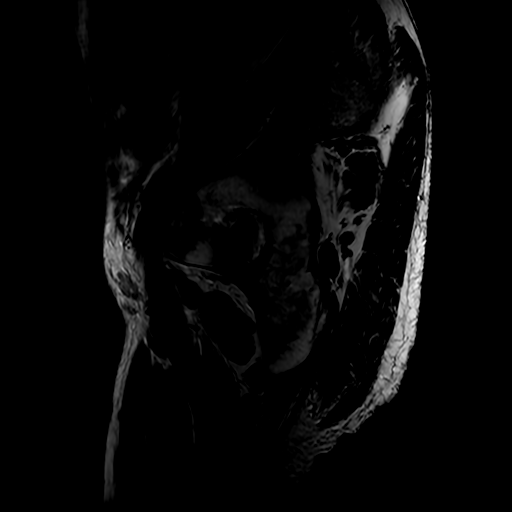
[im 70/70]
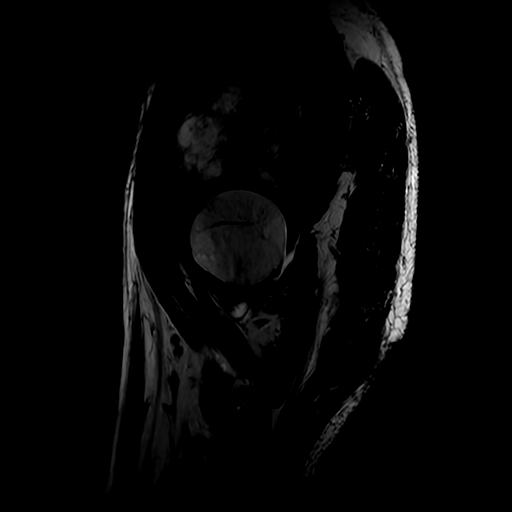

[Series 4: T2 · sagittal · 2.5mm · 0.53mm/px · 6 of 70 slices shown (2 of 3)]
[im 1/70]
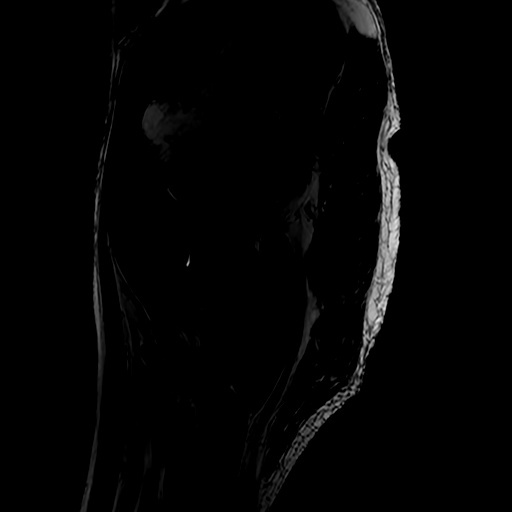
[im 12/70]
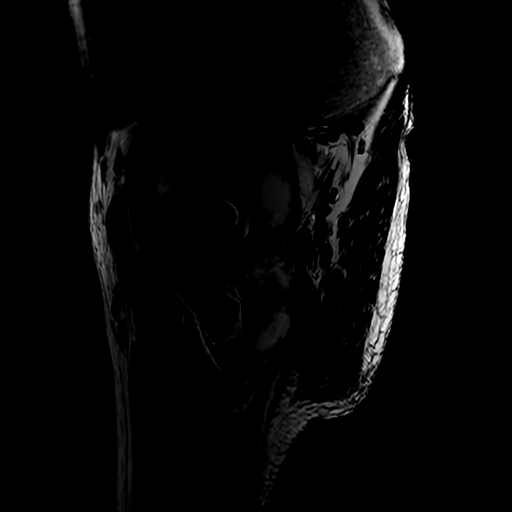
[im 24/70]
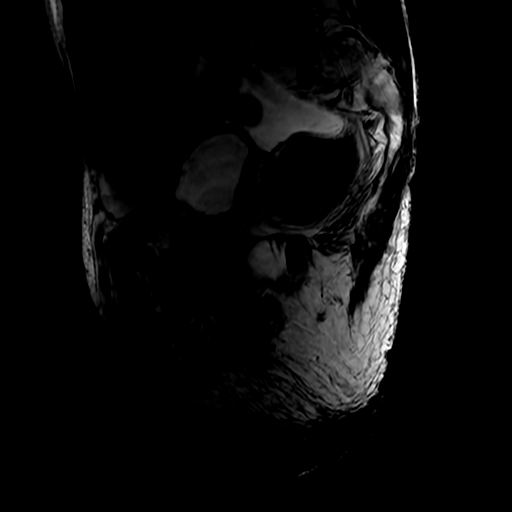
[im 35/70]
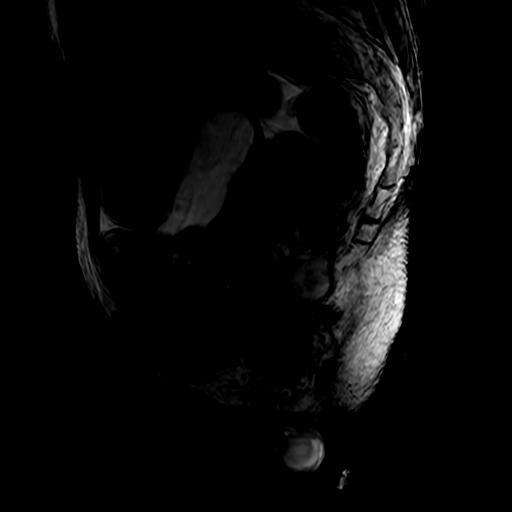
[im 47/70]
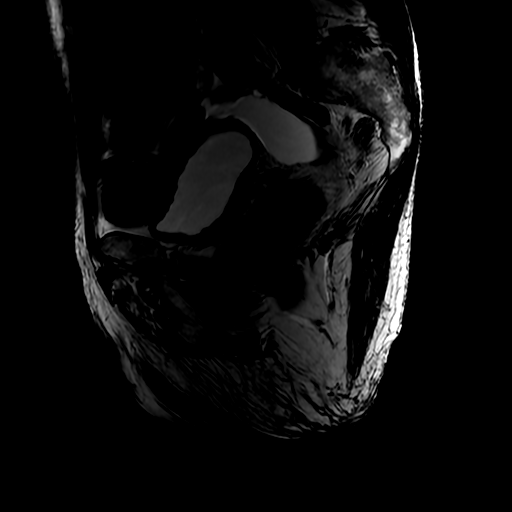
[im 58/70]
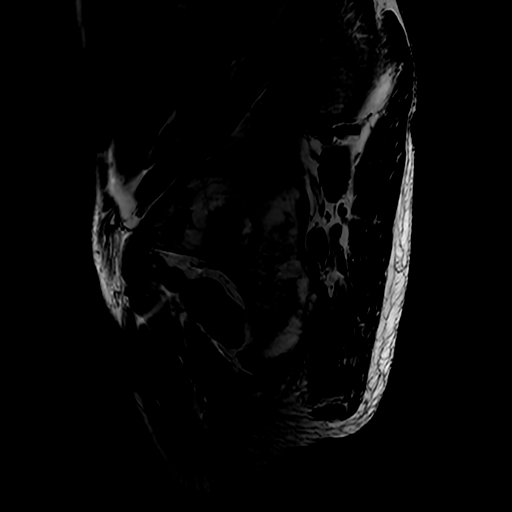

[Series 4: T2 fat-sat · sagittal · 2.5mm · 0.53mm/px · 3 of 70 slices shown]
[im 12/70]
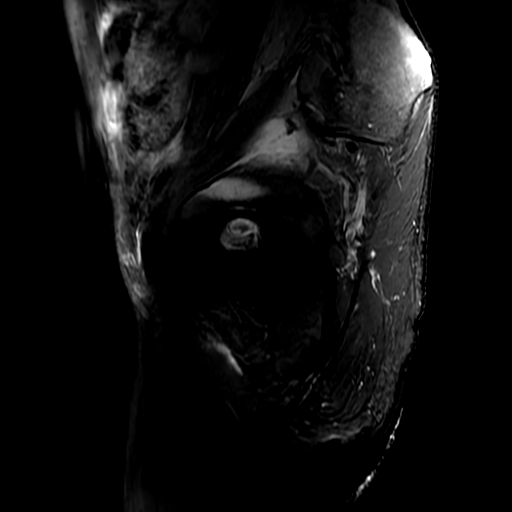
[im 35/70]
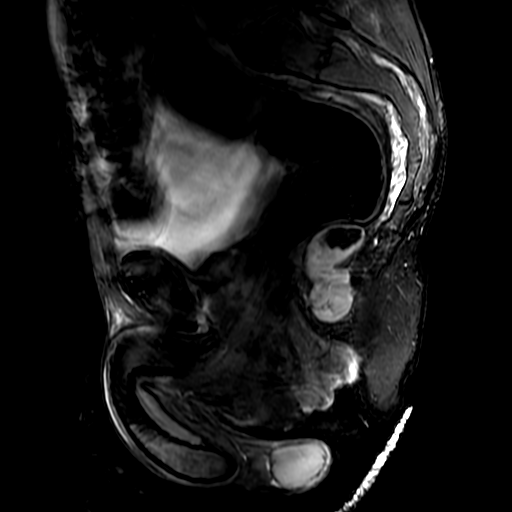
[im 58/70]
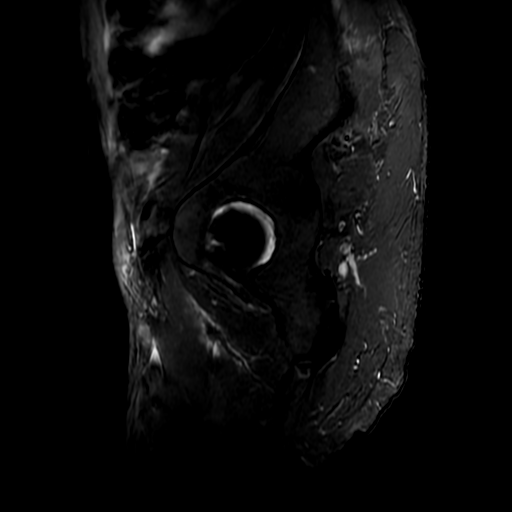

[Series 5: T2 · axial · 4.0mm · 0.47mm/px · z∈[+0,+222]mm · 3 of 53 slices shown (3 of 3)]
[im 1/53]
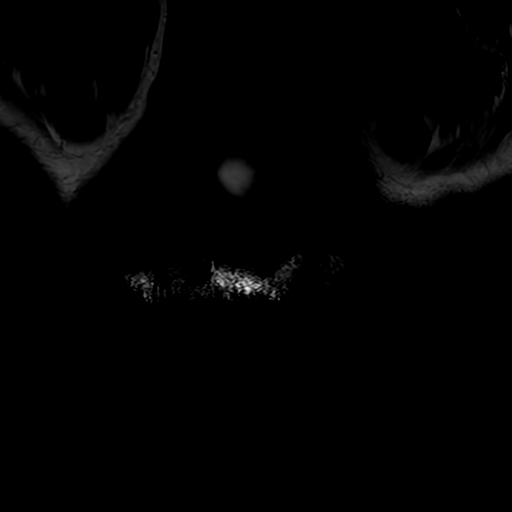
[im 27/53]
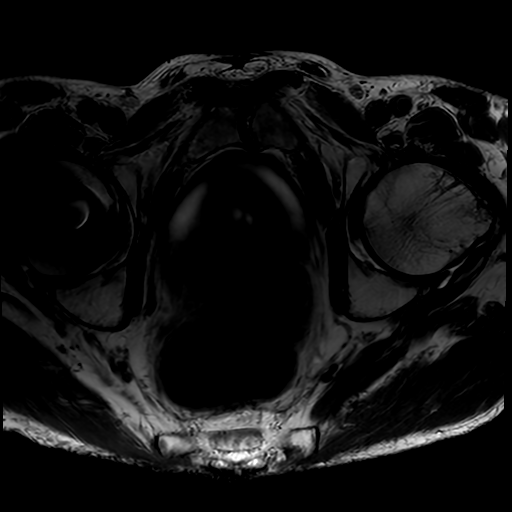
[im 53/53]
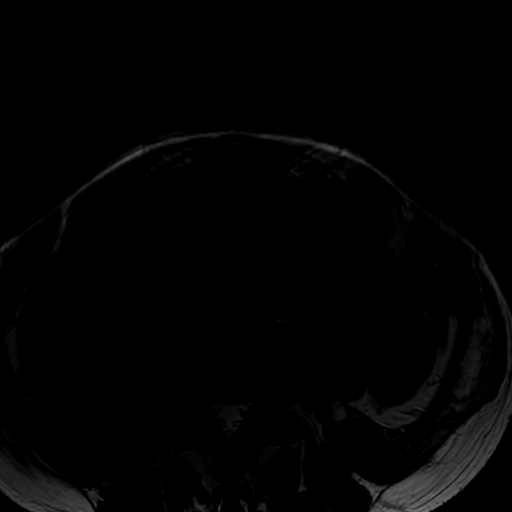

[18 of 48 positions shown; findings below may reference images not displayed]

FINDINGS: Urinary Tract: Limited assessment of the urinary tract shows no
gross thickening of the urinary bladder. No distal ureteral
dilation.

Bowel: There is an ulcerated, fungating lesion that extends from the
inferior aspect of the sphincter complex into the perianal region.
This measures 4.1 x 3.6 cm (image 43/6)

Separate from this area is a horseshoe shaped abnormality that
involves the sacrococcygeal region displacing the levator plate and
insinuating between slips of the pelvic floor musculature.
Heterogeneous T2 signal is noted

Signs of enhancement are suspected though subtraction images and
true baseline images not performed. Definitive enhancement of the
anal mass is noted.

Peri rectal and pelvic floor/sacrococcygeal process measuring 6.8 x
6.5 cm in the axial plane anterior posterior extension is greatest
on the RIGHT. Approximately 4.6 cm anterior-posterior extension on
the LEFT. Areas of low T2 intermixed with intermediate to high T2
signal within the lesion.

This appears to invade the distal rectum on sagittal image 38 where
approximately 2.5 x 1.0 cm nodule along the posterior wall of the
rectum is visible. The presence of extensive rectal gas limits
assessment of luminal and mass relationships

Vascular/Lymphatic: Vascular structures are not well assessed given
protocol performed. Top-normal size LEFT common iliac lymph node at
9 mm (image [DATE]) susceptibility artifact from a RIGHT hip
arthroplasty limits assessment of pelvic structures.

Reproductive: Prostate without gross abnormality. Limited assessment
of reproductive structures is unremarkable.

Other:  Trace fluid in the pelvis.

Musculoskeletal: RIGHT hip arthroplasty changes. The no focal
suspicious lesion or destructive process.
IMPRESSION: Perianal mass potentially related to rectal condylomata with
suspected malignant transformation and metastatic disease to the
pelvis as described. Horseshoe shaped complex lesion in the
perirectal space involving the pelvic floor and likely invading the
rectum. This is likely metastatic process related to squamous cell
carcinoma. Given location, unusual for metastases and not contiguous
with the perianal process would suggest endoscopic ultrasound with
biopsy as warranted for further assessment/characterization.

Top-normal size LEFT common iliac lymph node at 9 mm.

Given findings on prior exams and the current study PET evaluation
may be helpful for further assessment to complete staging as well.

## 2023-03-07 IMAGING — US US RENAL
1 series · 14 of 25 positions shown · non-contrast
Comparison: None.

CLINICAL DATA: Renal dysfunction

EXAM:
RENAL / URINARY TRACT ULTRASOUND COMPLETE

[Series 1: us renal · 14 of 32 slices shown]
[im 1/32]
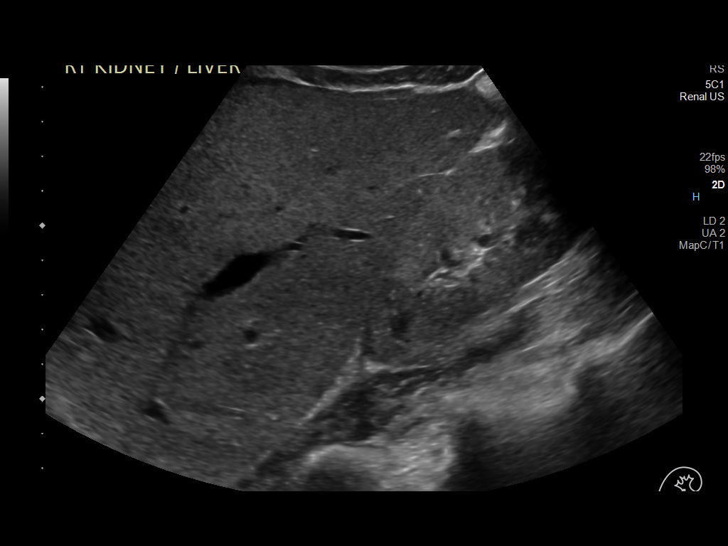
[im 3/32]
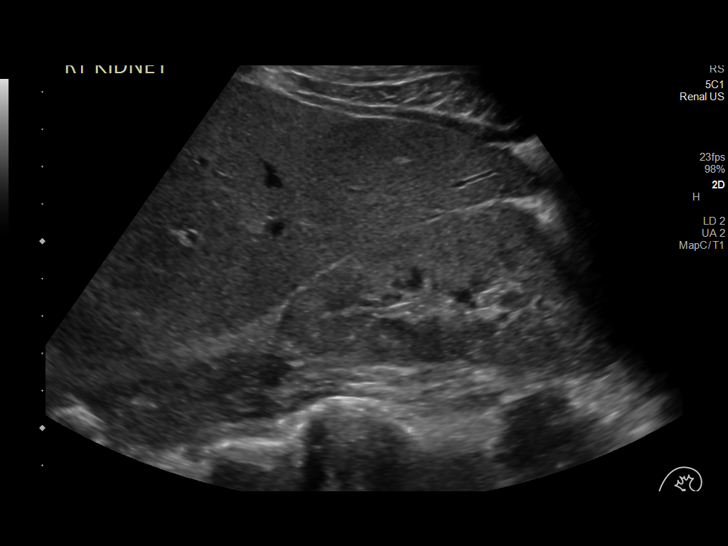
[im 6/32]
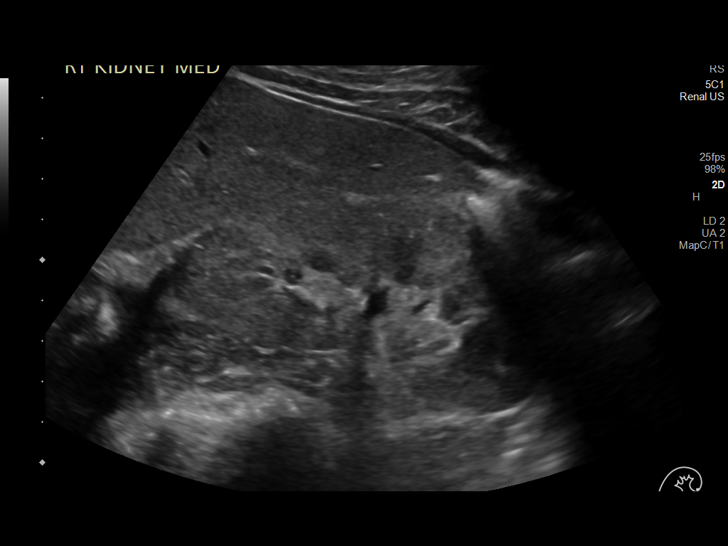
[im 8/32]
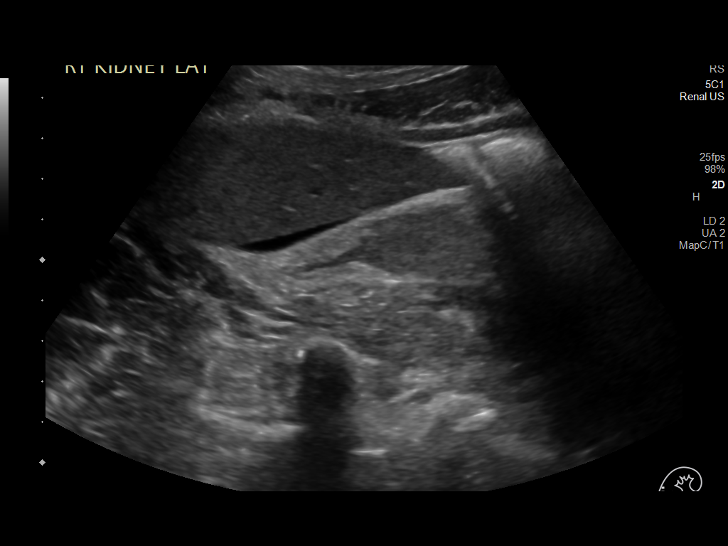
[im 11/32]
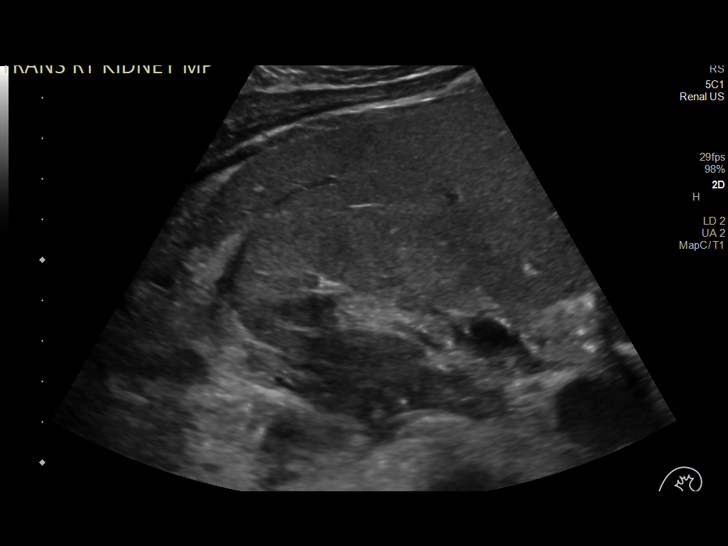
[im 12/32]
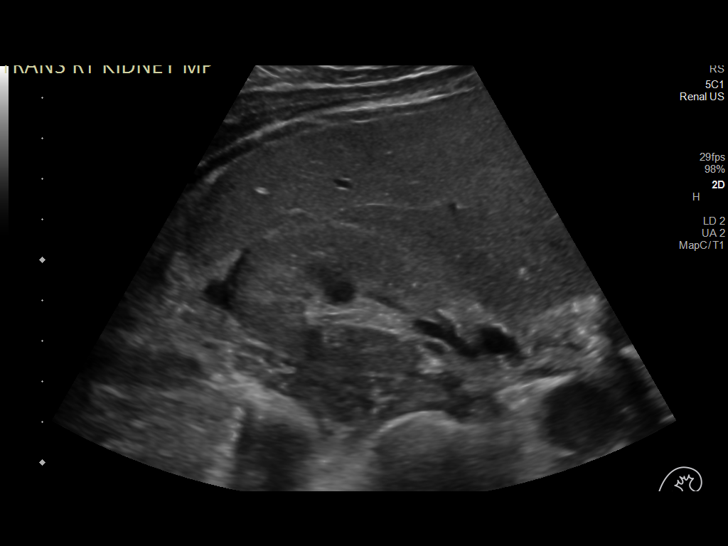
[im 15/32]
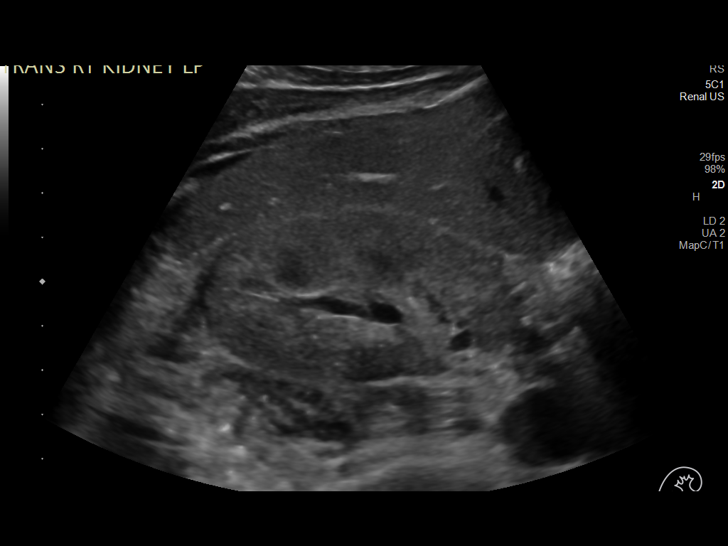
[im 17/32]
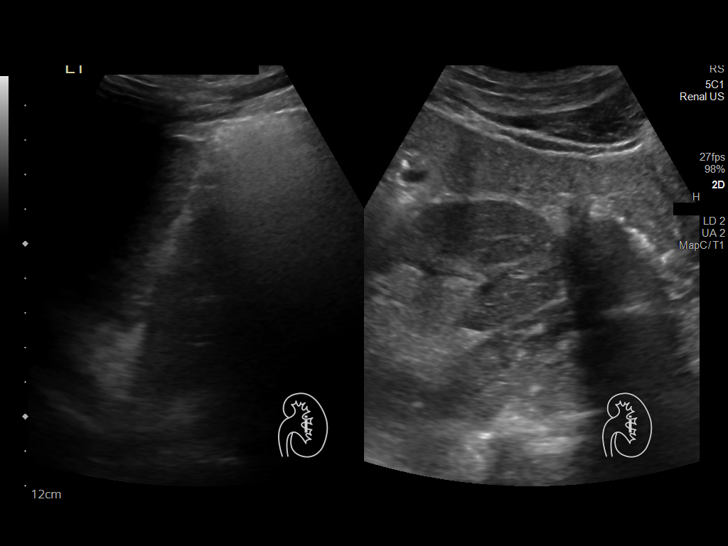
[im 20/32]
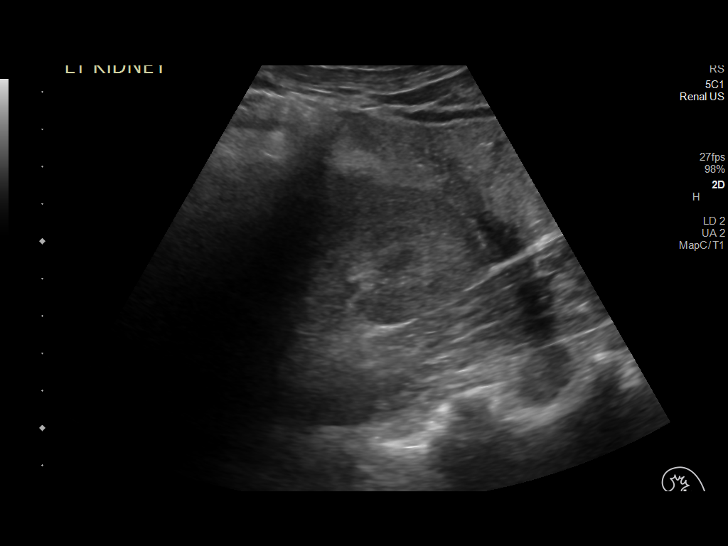
[im 21/32]
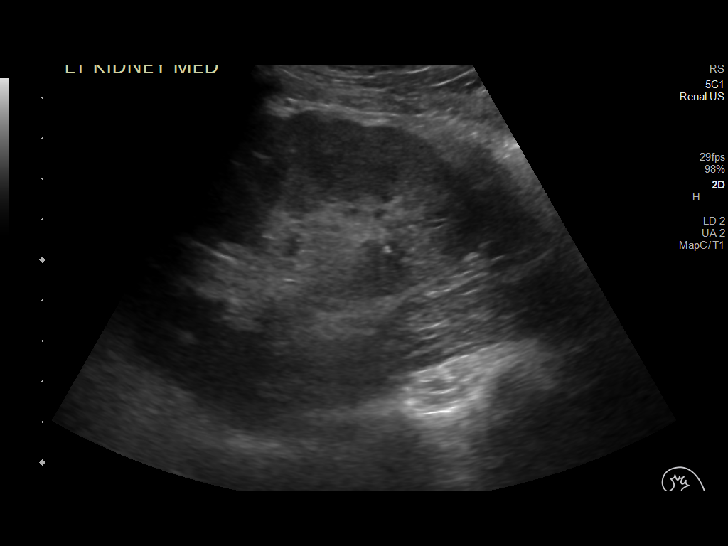
[im 24/32]
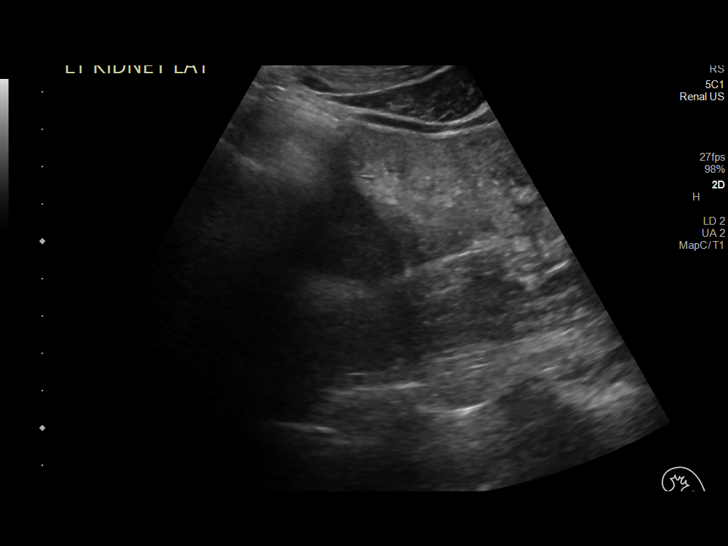
[im 26/32]
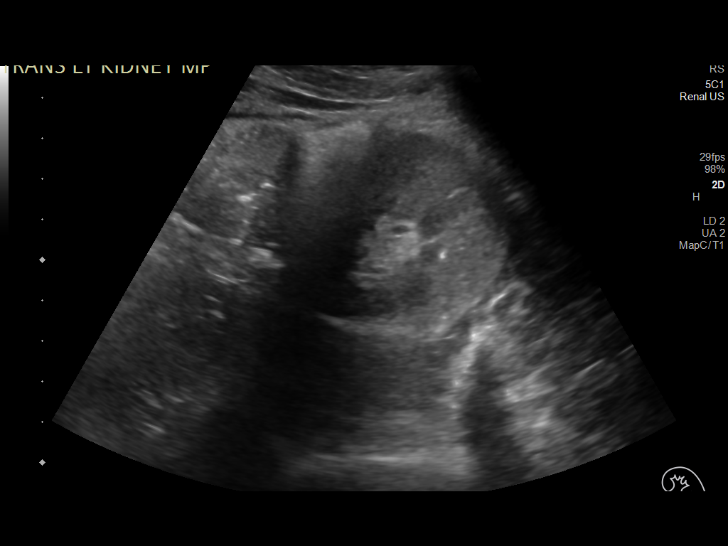
[im 29/32]
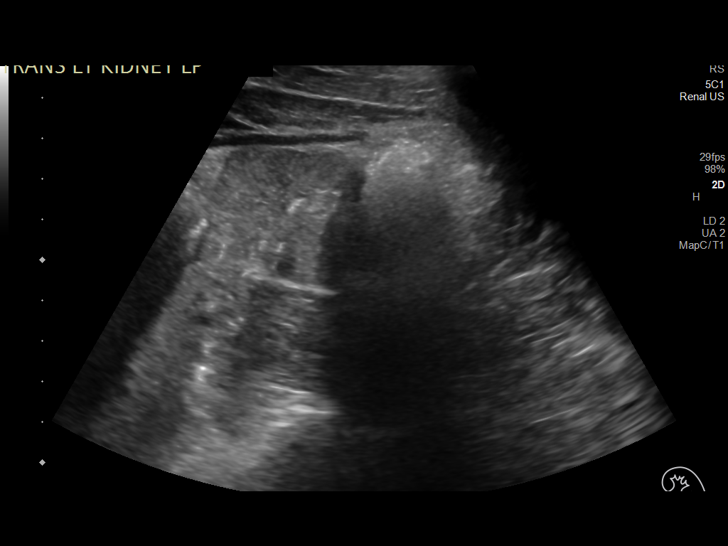
[im 32/32]
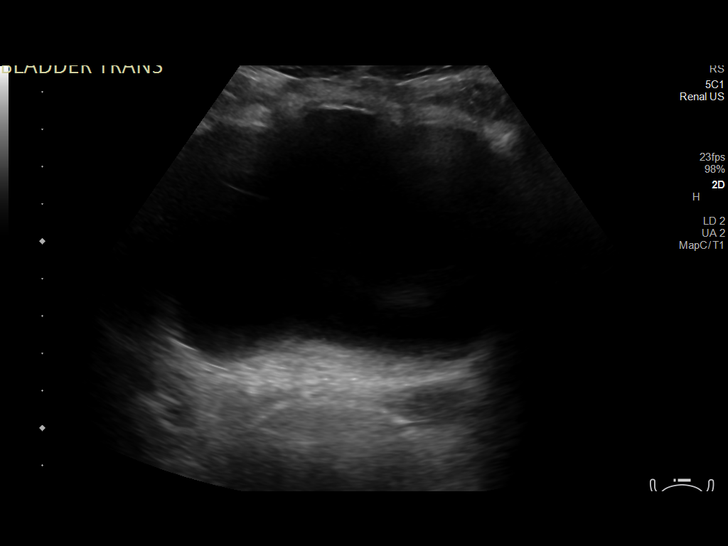

[14 of 25 positions shown; findings below may reference images not displayed]

FINDINGS: Right Kidney:

Renal measurements: 9.6 x 4.7 x 5 cm = volume: 118.1 mL. There is no
hydronephrosis. There is increased cortical echogenicity.

Left Kidney:

Renal measurements: 9.4 x 5.6 x 5 cm = volume: 136.1 mL. There is no
hydronephrosis. There is increased cortical echogenicity.

Bladder:

No focal abnormality is seen in the visualized portions of the
urinary bladder. Ureteral jets were not observed.

Other:

Minimal ascites is present.
IMPRESSION: There is no hydronephrosis. There is increased cortical echogenicity
suggesting medical renal disease. Minimal ascites.
